# Patient Record
Sex: Female | Born: 1985 | Race: White | Hispanic: Yes | Smoking: Current every day smoker
Health system: Southern US, Community
[De-identification: ages and names within clinical notes are randomized; demographics above are authoritative.]

## PROBLEM LIST (undated history)

## (undated) ENCOUNTER — Emergency Department (HOSPITAL_COMMUNITY): Admission: EM | Discharge status: Absent without leave | Payer: Medicare Other

## (undated) DIAGNOSIS — F32A Depression, unspecified: Secondary | ICD-10-CM

## (undated) DIAGNOSIS — R569 Unspecified convulsions: Secondary | ICD-10-CM

## (undated) DIAGNOSIS — M549 Dorsalgia, unspecified: Secondary | ICD-10-CM

## (undated) DIAGNOSIS — N200 Calculus of kidney: Secondary | ICD-10-CM

## (undated) DIAGNOSIS — F419 Anxiety disorder, unspecified: Secondary | ICD-10-CM

## (undated) DIAGNOSIS — F329 Major depressive disorder, single episode, unspecified: Secondary | ICD-10-CM

## (undated) HISTORY — DX: Major depressive disorder, single episode, unspecified: F32.9

## (undated) HISTORY — DX: Depression, unspecified: F32.A

## (undated) HISTORY — PX: IMPLANTATION VAGAL NERVE STIMULATOR: SUR692

## (undated) HISTORY — PX: VSD REPAIR: SHX276

## (undated) HISTORY — DX: Anxiety disorder, unspecified: F41.9

---

## 2015-10-04 DIAGNOSIS — G40909 Epilepsy, unspecified, not intractable, without status epilepticus: Secondary | ICD-10-CM | POA: Diagnosis not present

## 2015-10-04 DIAGNOSIS — F329 Major depressive disorder, single episode, unspecified: Secondary | ICD-10-CM | POA: Diagnosis not present

## 2015-10-04 DIAGNOSIS — F419 Anxiety disorder, unspecified: Secondary | ICD-10-CM | POA: Diagnosis not present

## 2015-10-04 DIAGNOSIS — Z0000 Encounter for general adult medical examination without abnormal findings: Secondary | ICD-10-CM | POA: Diagnosis not present

## 2015-11-21 DIAGNOSIS — F329 Major depressive disorder, single episode, unspecified: Secondary | ICD-10-CM | POA: Diagnosis not present

## 2015-11-21 DIAGNOSIS — R634 Abnormal weight loss: Secondary | ICD-10-CM | POA: Diagnosis not present

## 2015-11-21 DIAGNOSIS — F419 Anxiety disorder, unspecified: Secondary | ICD-10-CM | POA: Diagnosis not present

## 2015-12-18 DIAGNOSIS — G40219 Localization-related (focal) (partial) symptomatic epilepsy and epileptic syndromes with complex partial seizures, intractable, without status epilepticus: Secondary | ICD-10-CM | POA: Diagnosis not present

## 2015-12-18 DIAGNOSIS — G4700 Insomnia, unspecified: Secondary | ICD-10-CM | POA: Diagnosis not present

## 2015-12-18 DIAGNOSIS — Z9689 Presence of other specified functional implants: Secondary | ICD-10-CM | POA: Diagnosis not present

## 2016-04-03 DIAGNOSIS — F419 Anxiety disorder, unspecified: Secondary | ICD-10-CM | POA: Diagnosis not present

## 2016-04-03 DIAGNOSIS — G40909 Epilepsy, unspecified, not intractable, without status epilepticus: Secondary | ICD-10-CM | POA: Diagnosis not present

## 2016-04-03 DIAGNOSIS — H6983 Other specified disorders of Eustachian tube, bilateral: Secondary | ICD-10-CM | POA: Diagnosis not present

## 2016-05-07 DIAGNOSIS — J0110 Acute frontal sinusitis, unspecified: Secondary | ICD-10-CM | POA: Diagnosis not present

## 2016-05-07 DIAGNOSIS — Z8639 Personal history of other endocrine, nutritional and metabolic disease: Secondary | ICD-10-CM | POA: Diagnosis not present

## 2016-05-07 DIAGNOSIS — F419 Anxiety disorder, unspecified: Secondary | ICD-10-CM | POA: Diagnosis not present

## 2016-05-07 DIAGNOSIS — G4700 Insomnia, unspecified: Secondary | ICD-10-CM | POA: Diagnosis not present

## 2016-06-24 DIAGNOSIS — G40909 Epilepsy, unspecified, not intractable, without status epilepticus: Secondary | ICD-10-CM | POA: Diagnosis not present

## 2016-06-24 DIAGNOSIS — G4700 Insomnia, unspecified: Secondary | ICD-10-CM | POA: Diagnosis not present

## 2016-06-24 DIAGNOSIS — Z3009 Encounter for other general counseling and advice on contraception: Secondary | ICD-10-CM | POA: Diagnosis not present

## 2016-06-24 DIAGNOSIS — F418 Other specified anxiety disorders: Secondary | ICD-10-CM | POA: Diagnosis not present

## 2016-07-13 DIAGNOSIS — R1084 Generalized abdominal pain: Secondary | ICD-10-CM | POA: Diagnosis not present

## 2016-07-13 DIAGNOSIS — F17200 Nicotine dependence, unspecified, uncomplicated: Secondary | ICD-10-CM | POA: Diagnosis not present

## 2016-07-13 DIAGNOSIS — M545 Low back pain: Secondary | ICD-10-CM | POA: Diagnosis not present

## 2016-07-13 DIAGNOSIS — M5489 Other dorsalgia: Secondary | ICD-10-CM | POA: Diagnosis not present

## 2016-08-07 DIAGNOSIS — Z716 Tobacco abuse counseling: Secondary | ICD-10-CM | POA: Diagnosis not present

## 2016-08-07 DIAGNOSIS — Z01419 Encounter for gynecological examination (general) (routine) without abnormal findings: Secondary | ICD-10-CM | POA: Diagnosis not present

## 2016-08-07 DIAGNOSIS — Z124 Encounter for screening for malignant neoplasm of cervix: Secondary | ICD-10-CM | POA: Diagnosis not present

## 2016-08-07 DIAGNOSIS — Z30432 Encounter for removal of intrauterine contraceptive device: Secondary | ICD-10-CM | POA: Diagnosis not present

## 2016-09-17 DIAGNOSIS — G40219 Localization-related (focal) (partial) symptomatic epilepsy and epileptic syndromes with complex partial seizures, intractable, without status epilepticus: Secondary | ICD-10-CM | POA: Diagnosis not present

## 2016-09-17 DIAGNOSIS — Z4542 Encounter for adjustment and management of neuropacemaker (brain) (peripheral nerve) (spinal cord): Secondary | ICD-10-CM | POA: Diagnosis not present

## 2016-09-17 DIAGNOSIS — G4700 Insomnia, unspecified: Secondary | ICD-10-CM | POA: Diagnosis not present

## 2016-10-27 DIAGNOSIS — O3680X1 Pregnancy with inconclusive fetal viability, fetus 1: Secondary | ICD-10-CM | POA: Diagnosis not present

## 2016-10-27 DIAGNOSIS — Z1389 Encounter for screening for other disorder: Secondary | ICD-10-CM | POA: Diagnosis not present

## 2016-10-27 DIAGNOSIS — O99330 Smoking (tobacco) complicating pregnancy, unspecified trimester: Secondary | ICD-10-CM | POA: Diagnosis not present

## 2016-10-27 DIAGNOSIS — O09891 Supervision of other high risk pregnancies, first trimester: Secondary | ICD-10-CM | POA: Diagnosis not present

## 2016-10-27 DIAGNOSIS — G40901 Epilepsy, unspecified, not intractable, with status epilepticus: Secondary | ICD-10-CM | POA: Diagnosis not present

## 2016-10-27 DIAGNOSIS — O99350 Diseases of the nervous system complicating pregnancy, unspecified trimester: Secondary | ICD-10-CM | POA: Diagnosis not present

## 2016-10-27 DIAGNOSIS — Z3A01 Less than 8 weeks gestation of pregnancy: Secondary | ICD-10-CM | POA: Diagnosis not present

## 2016-10-27 DIAGNOSIS — R350 Frequency of micturition: Secondary | ICD-10-CM | POA: Diagnosis not present

## 2016-10-27 DIAGNOSIS — Z3401 Encounter for supervision of normal first pregnancy, first trimester: Secondary | ICD-10-CM | POA: Diagnosis not present

## 2016-10-27 DIAGNOSIS — Z3201 Encounter for pregnancy test, result positive: Secondary | ICD-10-CM | POA: Diagnosis not present

## 2016-12-02 DIAGNOSIS — S0081XA Abrasion of other part of head, initial encounter: Secondary | ICD-10-CM | POA: Diagnosis not present

## 2016-12-02 DIAGNOSIS — S0990XA Unspecified injury of head, initial encounter: Secondary | ICD-10-CM | POA: Diagnosis not present

## 2016-12-02 DIAGNOSIS — R569 Unspecified convulsions: Secondary | ICD-10-CM | POA: Diagnosis not present

## 2016-12-02 DIAGNOSIS — R892 Abnormal level of other drugs, medicaments and biological substances in specimens from other organs, systems and tissues: Secondary | ICD-10-CM | POA: Diagnosis not present

## 2016-12-02 DIAGNOSIS — S161XXA Strain of muscle, fascia and tendon at neck level, initial encounter: Secondary | ICD-10-CM | POA: Diagnosis not present

## 2016-12-02 DIAGNOSIS — S0083XA Contusion of other part of head, initial encounter: Secondary | ICD-10-CM | POA: Diagnosis not present

## 2016-12-04 DIAGNOSIS — G4700 Insomnia, unspecified: Secondary | ICD-10-CM | POA: Diagnosis not present

## 2016-12-04 DIAGNOSIS — G40219 Localization-related (focal) (partial) symptomatic epilepsy and epileptic syndromes with complex partial seizures, intractable, without status epilepticus: Secondary | ICD-10-CM | POA: Diagnosis not present

## 2016-12-04 DIAGNOSIS — Z4542 Encounter for adjustment and management of neuropacemaker (brain) (peripheral nerve) (spinal cord): Secondary | ICD-10-CM | POA: Diagnosis not present

## 2016-12-22 DIAGNOSIS — O039 Complete or unspecified spontaneous abortion without complication: Secondary | ICD-10-CM | POA: Diagnosis not present

## 2016-12-22 DIAGNOSIS — Z30430 Encounter for insertion of intrauterine contraceptive device: Secondary | ICD-10-CM | POA: Diagnosis not present

## 2016-12-22 DIAGNOSIS — N921 Excessive and frequent menstruation with irregular cycle: Secondary | ICD-10-CM | POA: Diagnosis not present

## 2017-01-12 DIAGNOSIS — Z969 Presence of functional implant, unspecified: Secondary | ICD-10-CM | POA: Diagnosis not present

## 2017-01-12 DIAGNOSIS — G4700 Insomnia, unspecified: Secondary | ICD-10-CM | POA: Diagnosis not present

## 2017-01-12 DIAGNOSIS — G40219 Localization-related (focal) (partial) symptomatic epilepsy and epileptic syndromes with complex partial seizures, intractable, without status epilepticus: Secondary | ICD-10-CM | POA: Diagnosis not present

## 2017-01-15 DIAGNOSIS — Z30431 Encounter for routine checking of intrauterine contraceptive device: Secondary | ICD-10-CM | POA: Diagnosis not present

## 2017-03-13 ENCOUNTER — Emergency Department (HOSPITAL_COMMUNITY)
Admission: EM | Admit: 2017-03-13 | Discharge: 2017-03-13 | Disposition: A | Discharge status: Home / Self Care | Payer: Medicare Other | Attending: Emergency Medicine | Admitting: Emergency Medicine

## 2017-03-13 ENCOUNTER — Encounter (HOSPITAL_COMMUNITY): Payer: Self-pay

## 2017-03-13 DIAGNOSIS — Y92019 Unspecified place in single-family (private) house as the place of occurrence of the external cause: Secondary | ICD-10-CM | POA: Diagnosis not present

## 2017-03-13 DIAGNOSIS — S39012A Strain of muscle, fascia and tendon of lower back, initial encounter: Secondary | ICD-10-CM | POA: Insufficient documentation

## 2017-03-13 DIAGNOSIS — Z975 Presence of (intrauterine) contraceptive device: Secondary | ICD-10-CM | POA: Diagnosis not present

## 2017-03-13 DIAGNOSIS — Y9389 Activity, other specified: Secondary | ICD-10-CM | POA: Diagnosis not present

## 2017-03-13 DIAGNOSIS — X500XXA Overexertion from strenuous movement or load, initial encounter: Secondary | ICD-10-CM | POA: Diagnosis not present

## 2017-03-13 DIAGNOSIS — Z79899 Other long term (current) drug therapy: Secondary | ICD-10-CM | POA: Diagnosis not present

## 2017-03-13 DIAGNOSIS — Y998 Other external cause status: Secondary | ICD-10-CM | POA: Diagnosis not present

## 2017-03-13 DIAGNOSIS — F17210 Nicotine dependence, cigarettes, uncomplicated: Secondary | ICD-10-CM | POA: Insufficient documentation

## 2017-03-13 DIAGNOSIS — S3992XA Unspecified injury of lower back, initial encounter: Secondary | ICD-10-CM | POA: Diagnosis present

## 2017-03-13 HISTORY — DX: Unspecified convulsions: R56.9

## 2017-03-13 HISTORY — DX: Dorsalgia, unspecified: M54.9

## 2017-03-13 MED ORDER — METHOCARBAMOL 500 MG PO TABS: 500.0000 mg | ORAL_TABLET | Freq: Two times a day (BID) | ORAL | 0 refills | Status: DC | PRN

## 2017-03-13 MED ORDER — KETOROLAC TROMETHAMINE 60 MG/2ML IM SOLN
60.0000 mg | Freq: Once | INTRAMUSCULAR | Status: AC
  Administered 2017-03-13: 60 mg via INTRAMUSCULAR
  Filled 2017-03-13: qty 2

## 2017-03-13 MED ORDER — DEXAMETHASONE SODIUM PHOSPHATE 10 MG/ML IJ SOLN
10.0000 mg | Freq: Once | INTRAMUSCULAR | Status: AC
  Administered 2017-03-13: 10 mg via INTRAMUSCULAR
  Filled 2017-03-13: qty 1

## 2017-03-13 MED ORDER — IBUPROFEN 800 MG PO TABS: 800.0000 mg | ORAL_TABLET | Freq: Three times a day (TID) | ORAL | 0 refills | Status: DC

## 2017-03-13 NOTE — ED Provider Notes (Signed)
[facility] DEPT Provider Note   CSN: [id number] Arrival date & time: 03/13/17  2153     History   Chief Complaint Chief Complaint  Patient presents with  . Back Pain    HPI Loretta Padilla is a 31 y.o. female.  HPI  The patient is a 31 year old female, she reports a chronic history of seizures and back pain, has recently moved here from the state of [place] and states that while they've been moving for the last 3 days she has done lots of lifting, bending and twisting and is developed some lower back in the bilateral lower to mid back radiating around the left side towards the left mid axillary line. She has no numbness or weakness of the legs, no fevers or chills, no history of cancer or IV drug use and has never had formal thoracic or lumbar surgery. She reports that she has worsening pain with bending and rotation, and seems to be better when she lays perfectly still but seems to hurt constantly. She has not used any specific medications prior to arrival. She has been taking her antiepileptic medications. She is not complaining of seizures this evening.  Past Medical History:  Diagnosis Date  . Back pain   . Seizures (HCC)     There are no active problems to display for this patient.   Past Surgical History:  Procedure Laterality Date  . VSD REPAIR      OB History    No data available       Home Medications    Prior to Admission medications   Medication Sig Start Date End Date Taking? Authorizing Provider  IUD'S IU by Intrauterine route.   Yes [provider]  topiramate (TOPAMAX) 100 MG tablet Take 200 mg by mouth 2 (two) times daily.   Yes [provider]  zolpidem (AMBIEN) 10 MG tablet Take 10 mg by mouth at bedtime as needed for sleep.   Yes [provider]  ibuprofen (ADVIL,MOTRIN) 800 MG tablet Take 1 tablet (800 mg total) by mouth 3 (three) times daily. 03/13/17   Eber Hong, MD  methocarbamol (ROBAXIN) 500 MG tablet Take 1  tablet (500 mg total) by mouth 2 (two) times daily as needed for muscle spasms. 03/13/17   Eber Hong, MD    Family History No family history on file.  Social History Social History  Substance Use Topics  . Smoking status: Current Every Day Smoker    Packs/day: 1.00  . Smokeless tobacco: Never Used  . Alcohol use Yes     Comment: occ.     Allergies   Carbatrol [carbamazepine]; Dilantin [phenytoin sodium extended]; Tizanidine hcl; and Venlafaxine   Review of Systems Review of Systems  Constitutional: Negative for chills and fever.  Cardiovascular: Negative for leg swelling.  Gastrointestinal: Negative for nausea and vomiting.       No incontinence of bowel  Genitourinary: Negative for difficulty urinating.       No incontinence or retention  Musculoskeletal: Positive for back pain. Negative for neck pain.  Skin: Negative for rash.  Neurological: Negative for weakness and numbness.     Physical Exam Updated Vital Signs BP 115/68 (BP Location: Right Arm)   Pulse 73   Temp 98.1 F (36.7 C) (Oral)   Resp 18   Ht 5\' 4"  (1.626 m)   Wt 52.2 kg (115 lb)   LMP 02/19/2017   SpO2 100%   BMI 19.74 kg/m   Physical Exam  Constitutional: She appears well-developed  and well-nourished. No distress.  HENT:  Head: Normocephalic and atraumatic.  Eyes: Conjunctivae are normal. Right eye exhibits no discharge. Left eye exhibits no discharge. No scleral icterus.  Cardiovascular: Normal rate and regular rhythm.   Pulmonary/Chest: Effort normal and breath sounds normal.  Musculoskeletal: She exhibits no edema.  Tenderness of the back over the bilateral paraspinal muscles in the lumbar and thoracic region more on the left than the right, no midline tenderness No tenderness over the Cervical, Thoracic or Lumbar Spine  Neurological:  Speech is clear, strength in the UE and LE's are normal at the major muscle groups including the hip, knee and ankles.  Sensation in tact to light touch  and pin prick of the bilateral LE's.  Normal reflexes at the knees bilaterally.  Gait antalgic secondary to pain minimally  Skin: Skin is warm and dry. No rash noted. She is not diaphoretic.     ED Treatments / Results  Labs (all labs ordered are listed, but only abnormal results are displayed) Labs Reviewed - No data to display   Radiology No results found.  Procedures Procedures (including critical care time)  Medications Ordered in ED Medications  dexamethasone (DECADRON) injection 10 mg (not administered)  ketorolac (TORADOL) injection 60 mg (not administered)     Initial Impression / Assessment and Plan / ED Course  I have reviewed the triage vital signs and the nursing notes.  Pertinent labs & imaging results that were available during my care of the patient were reviewed by me and considered in my medical decision making (see chart for details).     Benign back pain, likely lumbar strain, no signs of neurologic or red flags. Stable for discharge after given medications as above. Patient expressed understanding for the indications for return, follow-up list given for local family doctors. The patient was satisfied with her treatment plan and the indications for follow-up and expressed her understanding.  Final Clinical Impressions(s) / ED Diagnoses   Final diagnoses:  Strain of lumbar region, initial encounter    New Prescriptions New Prescriptions   IBUPROFEN (ADVIL,MOTRIN) 800 MG TABLET    Take 1 tablet (800 mg total) by mouth 3 (three) times daily.   METHOCARBAMOL (ROBAXIN) 500 MG TABLET    Take 1 tablet (500 mg total) by mouth 2 (two) times daily as needed for muscle spasms.     Eber HongMiller, Idell Hissong, MD 03/13/17 [phone]

## 2017-03-13 NOTE — Discharge Instructions (Signed)
Please obtain all of your results from medical records or have your doctors office obtain the results - share them with your doctor - you should be seen at your doctors office in the next 2 days. Call today to arrange your follow up. Take the medications as prescribed. Please review all of the medicines and only take them if you do not have an allergy to them. Please be aware that if you are taking birth control pills, taking other prescriptions, ESPECIALLY ANTIBIOTICS may make the birth control ineffective - if this is the case, either do not engage in sexual activity or use alternative methods of birth control such as condoms until you have finished the medicine and your family doctor says it is OK to restart them. If you are on a blood thinner such as COUMADIN, be aware that any other medicine that you take may cause the coumadin to either work too much, or not enough - you should have your coumadin level rechecked in next 7 days if this is the case.  ?  It is also a possibility that you have an allergic reaction to any of the medicines that you have been prescribed - Everybody reacts differently to medications and while MOST people have no trouble with most medicines, you may have a reaction such as nausea, vomiting, rash, swelling, shortness of breath. If this is the case, please stop taking the medicine immediately and contact your physician.  ?  You should return to the ER if you develop severe or worsening symptoms.   Robaxin twice daily as needed for muscle spasm Motrin 3 times daily as needed for pain Heat packs intermittently (wrapped in a towel)  [facility] Doctor List    Kari Baars MD. Specialty: Pulmonary Disease Contact information: [address]  [address]  [place] [address]  [phone]   Syliva Overman, MD. Specialty: [facility] Medicine Contact information: [address]  [place] [address]  [phone]   Lilyan Punt, MD. Specialty:  Family Medicine Contact information: [address]  [place] [address]  [phone]   Avon Gully, MD Specialty: Internal Medicine Contact information: [address] [place] [address]  [phone]   Catalina Pizza, MD. Specialty: Internal Medicine Contact information: [address]  [place] [address]  [phone]    [facility] (Dr. Selena Batten) Specialty: Family Medicine Contact information: [address]  [place] [address]  [phone]   John Giovanni, MD. Specialty: [facility] Medicine Contact information: [address]  [address]  [place] [address]  [phone]   Carylon Perches, MD. Specialty: Internal Medicine Contact information: [address]  [address]  [place] [address]  [phone]    [facility]  [address] [place], [place] [address] [phone]  Services [facility] offers a variety of basic health services.  Services include but are not limited to: Blood pressure checks  Heart rate checks  Blood sugar checks  Urine analysis  Rapid strep tests  Pregnancy tests.  Health education and referrals  People needing more complex services will be directed to a physician online. Using these virtual visits, doctors can evaluate and prescribe medicine and treatments. There will be no medication on-site, though [facility] will help patients fill their prescriptions at little to no cost.   For More information please go to: [email or web address]  Back Pain:   Your back pain should be treated with medicines such as ibuprofen or aleve and this back pain should get better over the next 2 weeks.  However if you develop severe or worsening pain, low back pain with fever, numbness, weakness or inability to walk or urinate, you should return to the ER immediately.  Please follow up with your  doctor this week for a recheck if still having symptoms. Low back pain is discomfort in the lower back that may be due to injuries to muscles and ligaments around the spine.  Occasionally, it may be caused by a a problem to a part of the spine called a disc.  The pain may last several days or a week;  However, most patients get completely well in 4 weeks.  Self - care:  The application of heat can help soothe the pain.  Maintaining your daily activities, including walking, is encourged, as it will help you get better faster than just staying in bed.  Medications are also useful to help with pain control.  A commonly prescribed medications includes acetaminophen.  This medication is generally safe, though you should not take more than 8 of the extra strength (500mg ) pills a day.  Non steroidal anti inflammatory medications including Ibuprofen and naproxen;  These medications help both pain and swelling and are very useful in treating back pain.  They should be taken with food, as they can cause stomach upset, and more seriously, stomach bleeding.    Muscle relaxants:  These medications can help with muscle tightness that is a cause of lower back pain.  Most of these medications can cause drowsiness, and it is not safe to drive or use dangerous machinery while taking them.  You will need to follow up with  Your primary healthcare provider in 1-2 weeks for reassessment.  Be aware that if you develop new symptoms, such as a fever, leg weakness, difficulty with or loss of control of your urine or bowels, abdominal pain, or more severe pain, you will need to seek medical attention and  / or return to the Emergency department.

## 2017-03-13 NOTE — ED Triage Notes (Signed)
Reports of lower back pain x3 days. 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

## 2017-04-04 ENCOUNTER — Encounter (HOSPITAL_COMMUNITY): Payer: Self-pay | Admitting: *Deleted

## 2017-04-04 DIAGNOSIS — R1032 Left lower quadrant pain: Secondary | ICD-10-CM | POA: Insufficient documentation

## 2017-04-04 DIAGNOSIS — F17210 Nicotine dependence, cigarettes, uncomplicated: Secondary | ICD-10-CM | POA: Diagnosis not present

## 2017-04-04 LAB — URINALYSIS, ROUTINE W REFLEX MICROSCOPIC
Glucose, UA: NEGATIVE mg/dL
Hgb urine dipstick: NEGATIVE
Ketones, ur: NEGATIVE mg/dL
Leukocytes, UA: NEGATIVE
Nitrite: NEGATIVE
Protein, ur: NEGATIVE mg/dL
Specific Gravity, Urine: 1.03 (ref 1.005–1.030)
pH: 5 (ref 5.0–8.0)

## 2017-04-04 LAB — COMPREHENSIVE METABOLIC PANEL
ALT: 11 U/L — ABNORMAL LOW (ref 14–54)
AST: 13 U/L — ABNORMAL LOW (ref 15–41)
Albumin: 4.2 g/dL (ref 3.5–5.0)
Alkaline Phosphatase: 46 U/L (ref 38–126)
Anion gap: 9 (ref 5–15)
BUN: 33 mg/dL — ABNORMAL HIGH (ref 6–20)
CO2: 20 mmol/L — ABNORMAL LOW (ref 22–32)
Calcium: 9.2 mg/dL (ref 8.9–10.3)
Chloride: 110 mmol/L (ref 101–111)
Creatinine, Ser: 0.97 mg/dL (ref 0.44–1.00)
GFR calc Af Amer: >60 mL/min (ref >60)
GFR calc non Af Amer: >60 mL/min (ref >60)
Glucose, Bld: 100 mg/dL — ABNORMAL HIGH (ref 65–99)
Potassium: 3.9 mmol/L (ref 3.5–5.1)
Sodium: 139 mmol/L (ref 135–145)
Total Bilirubin: 0.9 mg/dL (ref 0.3–1.2)
Total Protein: 6.6 g/dL (ref 6.5–8.1)

## 2017-04-04 LAB — CBC
HCT: 38.5 % (ref 36.0–46.0)
Hemoglobin: 13.2 g/dL (ref 12.0–15.0)
MCH: 30.8 pg (ref 26.0–34.0)
MCHC: 34.3 g/dL (ref 30.0–36.0)
MCV: 90 fL (ref 78.0–100.0)
Platelets: 224 10*3/uL (ref 150–400)
RBC: 4.28 MIL/uL (ref 3.87–5.11)
RDW: 12.2 % (ref 11.5–15.5)
WBC: 6.6 10*3/uL (ref 4.0–10.5)

## 2017-04-04 LAB — PREGNANCY, URINE: Preg Test, Ur: NEGATIVE

## 2017-04-04 LAB — LIPASE, BLOOD: Lipase: 31 U/L (ref 11–51)

## 2017-04-04 NOTE — ED Notes (Signed)
Pt in waiting room, NAD noted.

## 2017-04-04 NOTE — ED Triage Notes (Signed)
Pt c/o left lower abd, back pain with nausea that started today, weak urine stream, 6 days late for her period, and reflux for the past few days,

## 2017-04-05 ENCOUNTER — Emergency Department (HOSPITAL_COMMUNITY)
Admission: EM | Admit: 2017-04-05 | Discharge: 2017-04-05 | Disposition: A | Discharge status: Home / Self Care | Payer: Medicare Other | Attending: Emergency Medicine | Admitting: Emergency Medicine

## 2017-04-05 ENCOUNTER — Emergency Department (HOSPITAL_COMMUNITY): Payer: Medicare Other

## 2017-04-05 DIAGNOSIS — R1032 Left lower quadrant pain: Secondary | ICD-10-CM

## 2017-04-05 HISTORY — DX: Calculus of kidney: N20.0

## 2017-04-05 LAB — WET PREP, GENITAL
Trich, Wet Prep: NONE SEEN
Yeast Wet Prep HPF POC: NONE SEEN

## 2017-04-05 MED ORDER — SODIUM CHLORIDE 0.9 % IV BOLUS (SEPSIS)
1000.0000 mL | Freq: Once | INTRAVENOUS | Status: AC
  Administered 2017-04-05: 1000 mL via INTRAVENOUS

## 2017-04-05 MED ORDER — HYDROCODONE-ACETAMINOPHEN 5-325 MG PO TABS
1.0000 | ORAL_TABLET | Freq: Four times a day (QID) | ORAL | 0 refills | Status: DC | PRN
Start: 2017-04-05 — End: 2017-11-04

## 2017-04-05 MED ORDER — ONDANSETRON HCL 4 MG/2ML IJ SOLN
4.0000 mg | Freq: Once | INTRAMUSCULAR | Status: AC
  Administered 2017-04-05: 4 mg via INTRAVENOUS
  Filled 2017-04-05: qty 2

## 2017-04-05 MED ORDER — KETOROLAC TROMETHAMINE 30 MG/ML IJ SOLN
30.0000 mg | Freq: Once | INTRAMUSCULAR | Status: AC
  Administered 2017-04-05: 30 mg via INTRAVENOUS
  Filled 2017-04-05: qty 1

## 2017-04-05 MED ORDER — MORPHINE SULFATE (PF) 4 MG/ML IV SOLN
4.0000 mg | Freq: Once | INTRAVENOUS | Status: AC
  Administered 2017-04-05: 4 mg via INTRAVENOUS
  Filled 2017-04-05: qty 1

## 2017-04-05 NOTE — ED Provider Notes (Signed)
[facility] DEPT Provider Note   CSN: [id number] Arrival date & time: 04/04/17  2109     History   Chief Complaint Chief Complaint  Patient presents with  . Abdominal Pain    HPI Loretta Padilla is a 31 y.o. female.  HPI  This is a 31 year old female with a history of kidney stones, seizures who presents with left lower quadrant pain. Patient presents with pain that started earlier today. She reports intermittent left lower quadrant and left flank pain. It comes and goes. Reports nausea without vomiting. Currently her pain is 10 out of 10. Not improved at home with over-the-counter pain medications. She reports weak urinary stream. No dysuria or hematuria. She has a new IUD and is 6 days late for her period.  Also reports increased reflux symptoms. Denies vomiting, diarrhea, chest pain, shortness breath, fevers. She is in sure whether this feels like her prior kidney stones.  Past Medical History:  Diagnosis Date  . Back pain   . Kidney stones   . Seizures (HCC)     There are no active problems to display for this patient.   Past Surgical History:  Procedure Laterality Date  . VSD REPAIR      OB History    No data available       Home Medications    Prior to Admission medications   Medication Sig Start Date End Date Taking? Authorizing Provider  HYDROcodone-acetaminophen (NORCO/VICODIN) 5-325 MG tablet Take 1 tablet by mouth every 6 (six) hours as needed. 04/05/17   Geoffry Bannister, Mayer Maskerourtney F, MD  ibuprofen (ADVIL,MOTRIN) 800 MG tablet Take 1 tablet (800 mg total) by mouth 3 (three) times daily. 03/13/17   Eber HongMiller, Brian, MD  IUD'S IU by Intrauterine route.    [provider]  methocarbamol (ROBAXIN) 500 MG tablet Take 1 tablet (500 mg total) by mouth 2 (two) times daily as needed for muscle spasms. 03/13/17   Eber HongMiller, Brian, MD  topiramate (TOPAMAX) 100 MG tablet Take 200 mg by mouth 2 (two) times daily.    [provider]  zolpidem (AMBIEN) 10 MG tablet  Take 10 mg by mouth at bedtime as needed for sleep.    [provider]    Family History No family history on file.  Social History Social History  Substance Use Topics  . Smoking status: Current Every Day Smoker    Packs/day: 1.00  . Smokeless tobacco: Never Used  . Alcohol use Yes     Comment: occ.     Allergies   Carbatrol [carbamazepine]; Dilantin [phenytoin sodium extended]; Tizanidine hcl; and Venlafaxine   Review of Systems Review of Systems  Constitutional: Negative for fever.  Respiratory: Negative for shortness of breath.   Cardiovascular: Negative for chest pain.  Gastrointestinal: Positive for abdominal pain and nausea. Negative for constipation, diarrhea and vomiting.  Genitourinary: Positive for flank pain and urgency. Negative for difficulty urinating, vaginal discharge and vaginal pain.  All other systems reviewed and are negative.    Physical Exam Updated Vital Signs BP 108/74 (BP Location: Right Arm)   Pulse 64   Temp 98.3 F (36.8 C) (Oral)   Resp 16   Ht 5\' 5"  (1.651 m)   SpO2 100%   Physical Exam  Constitutional: She is oriented to person, place, and time. She appears well-developed and well-nourished. No distress.  HENT:  Head: Normocephalic and atraumatic.  Cardiovascular: Normal rate, regular rhythm and normal heart sounds.   Pulmonary/Chest: Effort normal. No respiratory distress. She has no  wheezes.  Abdominal: Soft. Bowel sounds are normal. There is tenderness. There is no rebound and no guarding.  Left lower quadrant tenderness palpation, no rebound or guarding  Genitourinary:  Genitourinary Comments: External vaginal exam normal, moderate white vaginal discharge, IUD strings visualized, no cervical motion tenderness, left adnexal tenderness without mass  Neurological: She is alert and oriented to person, place, and time.  Skin: Skin is warm and dry.  Psychiatric: She has a normal mood and affect.  Nursing note and vitals  reviewed.    ED Treatments / Results  Labs (all labs ordered are listed, but only abnormal results are displayed) Labs Reviewed  WET PREP, GENITAL - Abnormal; Notable for the following:       Result Value   Clue Cells Wet Prep HPF POC PRESENT (*)    WBC, Wet Prep HPF POC MODERATE (*)    All other components within normal limits  COMPREHENSIVE METABOLIC PANEL - Abnormal; Notable for the following:    CO2 20 (*)    Glucose, Bld 100 (*)    BUN 33 (*)    AST 13 (*)    ALT 11 (*)    All other components within normal limits  URINALYSIS, ROUTINE W REFLEX MICROSCOPIC - Abnormal; Notable for the following:    Bilirubin Urine MODERATE (*)    All other components within normal limits  LIPASE, BLOOD  CBC  PREGNANCY, URINE  GC/CHLAMYDIA PROBE AMP ([place]) NOT AT [facility]    EKG  EKG Interpretation None       Radiology Ct Renal Stone Study  Result Date: 04/05/2017 CLINICAL DATA:  Left lower quadrant abdominal pain, back pain, and nausea starting today. Weak urine stream. Six days late for period. EXAM: CT ABDOMEN AND PELVIS WITHOUT CONTRAST TECHNIQUE: Multidetector CT imaging of the abdomen and pelvis was performed following the standard protocol without IV contrast. COMPARISON:  None. FINDINGS: Lower chest: Lung bases are clear. Hepatobiliary: No focal liver abnormality is seen. No gallstones, gallbladder wall thickening, or biliary dilatation. Pancreas: Unremarkable. No pancreatic ductal dilatation or surrounding inflammatory changes. Spleen: Normal in size without focal abnormality. Adrenals/Urinary Tract: Adrenal glands are unremarkable. Kidneys are normal, without renal calculi, focal lesion, or hydronephrosis. Bladder is unremarkable. Stomach/Bowel: Stomach is within normal limits. Appendix appears normal. No evidence of bowel wall thickening, distention, or inflammatory changes. Vascular/Lymphatic: No significant vascular findings are present. No enlarged abdominal or pelvic lymph  nodes. Reproductive: An intrauterine device is present. Uterus and ovaries are not enlarged. Other: No abdominal wall hernia or abnormality. No abdominopelvic ascites. Musculoskeletal: No acute or significant osseous findings. IMPRESSION: No acute process demonstrated in the abdomen or pelvis. No renal or ureteral stone or obstruction. An intrauterine device is present. Electronically Signed   By: Burman NievesWilliam  Stevens M.D.   On: 04/05/2017 01:50    Procedures Procedures (including critical care time)  Medications Ordered in ED Medications  sodium chloride 0.9 % bolus 1,000 mL (1,000 mLs Intravenous New Bag/Given 04/05/17 0121)  ketorolac (TORADOL) 30 MG/ML injection 30 mg (30 mg Intravenous Given 04/05/17 0122)  morphine 4 MG/ML injection 4 mg (4 mg Intravenous Given 04/05/17 0208)  ondansetron (ZOFRAN) injection 4 mg (4 mg Intravenous Given 04/05/17 0122)     Initial Impression / Assessment and Plan / ED Course  I have reviewed the triage vital signs and the nursing notes.  Pertinent labs & imaging results that were available during my care of the patient were reviewed by me and considered in my medical  decision making (see chart for details).     Patient presents with left lower quadrant pain 1 day. She's otherwise nontoxic-appearing. She does have some mild tenderness on exam. Lab work is largely reassuring. She states she is not concerned about STDs. Given radiation to the flank, could be a urinary tract infection or kidney stones. Also left ovarian pathology is consideration. Basic labwork is reassuring. Patient given pain and nausea medication. CT stone study obtained and is negative. Discussed with patient return for pelvic ultrasound. Will continue supportive measures as an outpatient.  After history, exam, and medical workup I feel the patient has been appropriately medically screened and is safe for discharge home. Pertinent diagnoses were discussed with the patient. Patient was given  return precautions.  Final Clinical Impressions(s) / ED Diagnoses   Final diagnoses:  LLQ pain    New Prescriptions New Prescriptions   HYDROCODONE-ACETAMINOPHEN (NORCO/VICODIN) 5-325 MG TABLET    Take 1 tablet by mouth every 6 (six) hours as needed.     Shon Baton, MD 04/05/17 [phone]

## 2017-04-05 NOTE — Discharge Instructions (Signed)
You were seen today for abdominal pain. Your workup is fairly reassuring. Follow-up later today for ultrasound to evaluate your ovaries. You'll be given a short course of pain medication.

## 2017-04-06 ENCOUNTER — Other Ambulatory Visit (HOSPITAL_COMMUNITY): Payer: Self-pay | Admitting: Emergency Medicine

## 2017-04-06 DIAGNOSIS — R1032 Left lower quadrant pain: Secondary | ICD-10-CM

## 2017-04-06 LAB — GC/CHLAMYDIA PROBE AMP (~~LOC~~) NOT AT ARMC
Chlamydia: NEGATIVE
Neisseria Gonorrhea: NEGATIVE

## 2017-04-09 ENCOUNTER — Ambulatory Visit (HOSPITAL_COMMUNITY)
Admission: RE | Admit: 2017-04-09 | Discharge: 2017-04-09 | Disposition: A | Discharge status: Home / Self Care | Payer: Medicare Other | Source: Ambulatory Visit | Attending: Emergency Medicine | Admitting: Emergency Medicine

## 2017-04-09 DIAGNOSIS — R1032 Left lower quadrant pain: Secondary | ICD-10-CM

## 2017-04-22 DIAGNOSIS — G40802 Other epilepsy, not intractable, without status epilepticus: Secondary | ICD-10-CM | POA: Diagnosis not present

## 2017-04-22 DIAGNOSIS — F339 Major depressive disorder, recurrent, unspecified: Secondary | ICD-10-CM | POA: Diagnosis not present

## 2017-05-04 ENCOUNTER — Telehealth (HOSPITAL_COMMUNITY): Payer: Self-pay | Admitting: *Deleted

## 2017-05-04 NOTE — Telephone Encounter (Signed)
left voice message regarding an appointment. 

## 2017-05-07 ENCOUNTER — Ambulatory Visit (INDEPENDENT_AMBULATORY_CARE_PROVIDER_SITE_OTHER): Payer: Medicare Other | Admitting: Psychiatry

## 2017-05-07 ENCOUNTER — Encounter (HOSPITAL_COMMUNITY): Payer: Self-pay | Admitting: Psychiatry

## 2017-05-07 ENCOUNTER — Encounter (INDEPENDENT_AMBULATORY_CARE_PROVIDER_SITE_OTHER): Payer: Self-pay

## 2017-05-07 DIAGNOSIS — Z91411 Personal history of adult psychological abuse: Secondary | ICD-10-CM | POA: Diagnosis not present

## 2017-05-07 DIAGNOSIS — F331 Major depressive disorder, recurrent, moderate: Secondary | ICD-10-CM | POA: Diagnosis not present

## 2017-05-07 DIAGNOSIS — Z818 Family history of other mental and behavioral disorders: Secondary | ICD-10-CM

## 2017-05-07 DIAGNOSIS — Z91410 Personal history of adult physical and sexual abuse: Secondary | ICD-10-CM

## 2017-05-07 DIAGNOSIS — Z87891 Personal history of nicotine dependence: Secondary | ICD-10-CM | POA: Diagnosis not present

## 2017-05-07 DIAGNOSIS — Z62810 Personal history of physical and sexual abuse in childhood: Secondary | ICD-10-CM | POA: Diagnosis not present

## 2017-05-07 DIAGNOSIS — F329 Major depressive disorder, single episode, unspecified: Secondary | ICD-10-CM | POA: Insufficient documentation

## 2017-05-07 DIAGNOSIS — Z811 Family history of alcohol abuse and dependence: Secondary | ICD-10-CM

## 2017-05-07 DIAGNOSIS — R569 Unspecified convulsions: Secondary | ICD-10-CM

## 2017-05-07 DIAGNOSIS — F32A Depression, unspecified: Secondary | ICD-10-CM | POA: Insufficient documentation

## 2017-05-07 DIAGNOSIS — M549 Dorsalgia, unspecified: Secondary | ICD-10-CM

## 2017-05-07 DIAGNOSIS — G4700 Insomnia, unspecified: Secondary | ICD-10-CM | POA: Diagnosis not present

## 2017-05-07 MED ORDER — FLUOXETINE HCL 20 MG PO TABS: 20.0000 mg | ORAL_TABLET | Freq: Three times a day (TID) | ORAL | 2 refills | Status: DC
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
## 2017-05-14 ENCOUNTER — Emergency Department (HOSPITAL_COMMUNITY): Payer: Medicare Other

## 2017-05-14 ENCOUNTER — Encounter (HOSPITAL_COMMUNITY): Payer: Self-pay | Admitting: Emergency Medicine

## 2017-05-14 ENCOUNTER — Telehealth (HOSPITAL_COMMUNITY): Payer: Self-pay | Admitting: *Deleted

## 2017-05-14 ENCOUNTER — Emergency Department (HOSPITAL_COMMUNITY)
Admission: EM | Admit: 2017-05-14 | Discharge: 2017-05-14 | Disposition: A | Discharge status: Home / Self Care | Payer: Medicare Other | Attending: Emergency Medicine | Admitting: Emergency Medicine

## 2017-05-14 DIAGNOSIS — G40909 Epilepsy, unspecified, not intractable, without status epilepticus: Secondary | ICD-10-CM

## 2017-05-14 DIAGNOSIS — G4489 Other headache syndrome: Secondary | ICD-10-CM | POA: Diagnosis not present

## 2017-05-14 DIAGNOSIS — R569 Unspecified convulsions: Secondary | ICD-10-CM | POA: Diagnosis not present

## 2017-05-14 DIAGNOSIS — Z79899 Other long term (current) drug therapy: Secondary | ICD-10-CM | POA: Insufficient documentation

## 2017-05-14 DIAGNOSIS — R51 Headache: Secondary | ICD-10-CM | POA: Insufficient documentation

## 2017-05-14 DIAGNOSIS — R110 Nausea: Secondary | ICD-10-CM | POA: Diagnosis not present

## 2017-05-14 DIAGNOSIS — Z87891 Personal history of nicotine dependence: Secondary | ICD-10-CM | POA: Diagnosis not present

## 2017-05-14 LAB — I-STAT CHEM 8, ED
BUN: 18 mg/dL (ref 6–20)
Calcium, Ion: 1.33 mmol/L (ref 1.15–1.40)
Chloride: 107 mmol/L (ref 101–111)
Creatinine, Ser: 0.8 mg/dL (ref 0.44–1.00)
Glucose, Bld: 92 mg/dL (ref 65–99)
HCT: 36 % (ref 36.0–46.0)
Hemoglobin: 12.2 g/dL (ref 12.0–15.0)
Potassium: 3.8 mmol/L (ref 3.5–5.1)
Sodium: 140 mmol/L (ref 135–145)
TCO2: 22 mmol/L (ref 22–32)

## 2017-05-14 LAB — URINALYSIS, ROUTINE W REFLEX MICROSCOPIC
Bilirubin Urine: NEGATIVE
Glucose, UA: NEGATIVE mg/dL
Hgb urine dipstick: NEGATIVE
Ketones, ur: NEGATIVE mg/dL
Leukocytes, UA: NEGATIVE
Nitrite: NEGATIVE
Protein, ur: NEGATIVE mg/dL
Specific Gravity, Urine: 1.005 (ref 1.005–1.030)
pH: 9 — ABNORMAL HIGH (ref 5.0–8.0)

## 2017-05-14 LAB — PREGNANCY, URINE: Preg Test, Ur: NEGATIVE

## 2017-05-14 MED ORDER — ACETAMINOPHEN 325 MG PO TABS
650.0000 mg | ORAL_TABLET | Freq: Once | ORAL | Status: AC
  Administered 2017-05-14: 650 mg via ORAL
  Filled 2017-05-14: qty 2

## 2017-05-14 MED ORDER — ONDANSETRON 4 MG PO TBDP: 4.0000 mg | ORAL_TABLET | Freq: Three times a day (TID) | ORAL | 0 refills | Status: DC | PRN

## 2017-05-14 NOTE — ED Provider Notes (Signed)
[facility]-EMER[facility]ENCY DEPT Provider Note   CSN: [id number] Arrival date & time: 05/14/17  1753     History   Chief Complaint Chief Complaint  Patient presents with  . Seizures    HPI Loretta Padilla is a 31 y.o. female.  HPI Pt was seen at 1800. Per pt, c/o gradual onset and resolution of several episodes of "seizures" that occurred PTA. Pt states she was painting when her symptoms began. Pt thinks her trigger was either the paint fumes or the color of the paint. Pt states she had "3 silent seizures:" these are seizures that she is aware of, states she remains awake and feels "a fluttering behind my eyes." Pt states she went and laid down and then felt her head turn to the left. Pt states "that usually means I'm going to have a seizure." Pt states she does not recall events until she woke up and was told she had a seizure. Pt states her seizures are associated with nausea and headache, of which she had both today. Pt states she had skipped the last 2 days of her seizure medication; but has taken her usual dose today. Denies incont of bowel/bladder, no fall, no focal motor weakness, no tingling/numbness in extremities, no CP/palpitations, no SOB/cough, no abd pain, no N/V/D. Pt has recently moved to the area and is due to establish care with a Neuro MD in [place].    Past Medical History:  Diagnosis Date  . Anxiety   . Back pain   . Depression   . Kidney stones   . Seizures [facility](HCC)     Patient Active Problem List   Diagnosis Date Noted  . Depression 05/07/2017  . Seizures (HCC) 05/07/2017    Past Surgical History:  Procedure Laterality Date  . IMPLANTATION VAGAL NERVE STIMULATOR    . VSD REPAIR      OB History    No data available       Home Medications    Prior to Admission medications   Medication Sig Start Date End Date Taking? Authorizing Provider  FLUoxetine (PROZAC) 20 MG tablet Take 1 tablet (20 mg total) by mouth 3 (three) times daily. 05/07/17   Myrlene Brokeross, Deborah R,  MD  HYDROcodone-acetaminophen (NORCO/VICODIN) 5-325 MG tablet Take 1 tablet by mouth every 6 (six) hours as needed. 04/05/17   Horton, Mayer Maskerourtney F, MD  ibuprofen (ADVIL,MOTRIN) 800 MG tablet Take 1 tablet (800 mg total) by mouth 3 (three) times daily. 03/13/17   Eber HongMiller, Brian, MD  IUD'S IU by Intrauterine route.    [provider]  methocarbamol (ROBAXIN) 500 MG tablet Take 1 tablet (500 mg total) by mouth 2 (two) times daily as needed for muscle spasms. 03/13/17   Eber HongMiller, Brian, MD  topiramate (TOPAMAX) 100 MG tablet Take 200 mg by mouth 2 (two) times daily.    [provider]  zolpidem (AMBIEN) 10 MG tablet Take 10 mg by mouth at bedtime as needed for sleep.    [provider]    Family History Family History  Problem Relation Age of Onset  . Bipolar disorder Mother   . Alcohol abuse Mother   . Depression Sister   . Anxiety disorder Sister   . Alcohol abuse Maternal Grandfather   . Alcohol abuse Paternal Grandfather     Social History Social History  Substance Use Topics  . Smoking status: Former Smoker    Packs/day: 1.00  . Smokeless tobacco: Never Used     Comment: 05-07-2017 per pt she stopped  3 wks from this date  . Alcohol use Yes     Comment: occ., 05-07-2017 per pt 2-3 times a mth     Allergies   Carbatrol [carbamazepine]; Dilantin [phenytoin sodium extended]; Tizanidine hcl; and Venlafaxine   Review of Systems Review of Systems ROS: Statement: All systems negative except as marked or noted in the HPI; Constitutional: Negative for fever and chills. ; ; Eyes: Negative for eye pain, redness and discharge. ; ; ENMT: Negative for ear pain, hoarseness, nasal congestion, sinus pressure and sore throat. ; ; Cardiovascular: Negative for chest pain, palpitations, diaphoresis, dyspnea and peripheral edema. ; ; Respiratory: Negative for cough, wheezing and stridor. ; ; Gastrointestinal: Negative for nausea, vomiting, diarrhea, abdominal pain, blood in stool,  hematemesis, jaundice and rectal bleeding. . ; ; Genitourinary: Negative for dysuria, flank pain and hematuria. ; ; Musculoskeletal: Negative for back pain and neck pain. Negative for swelling and trauma.; ; Skin: Negative for pruritus, rash, abrasions, blisters, bruising and skin lesion.; ; Neuro: Negative for headache, lightheadedness and neck stiffness. Negative for weakness, extremity weakness, paresthesias, +seizure.       Physical Exam Updated Vital Signs BP 104/72 (BP Location: Left Arm)   Pulse 83   Temp 98.5 F (36.9 C) (Oral)   Resp 16   Ht  (1.651 m)   Wt 54.4 kg (120 lb)   SpO2 98%   BMI 19.97 kg/m   Physical Exam 1805: Physical examination:  Nursing notes reviewed; Vital signs and O2 SAT reviewed;  Constitutional: Well developed, Well nourished, Well hydrated, In no acute distress; Head:  Normocephalic, atraumatic; Eyes: EOMI, PERRL, No scleral icterus; ENMT: Mouth and pharynx normal, Mucous membranes moist; Neck: Supple, Full range of motion, No lymphadenopathy; Cardiovascular: Regular rate and rhythm, No gallop; Respiratory: Breath sounds clear & equal bilaterally, No wheezes.  Speaking full sentences with ease, Normal respiratory effort/excursion; Chest: +VNS left chest wall without overlying erythema. Nontender, Movement normal; Abdomen: Soft, Nontender, Nondistended, Normal bowel sounds; Genitourinary: No CVA tenderness; Extremities: Pulses normal, No tenderness, No edema, No calf edema or asymmetry.; Neuro: AA&Ox3, Major CN grossly intact.  No facial droop. Speech clear. No gross focal motor or sensory deficits in extremities.; Skin: Color normal, Warm, Dry.   ED Treatments / Results  Labs (all labs ordered are listed, but only abnormal results are displayed)   EKG  EKG Interpretation None       Radiology   Procedures Procedures (including critical care time)  Medications Ordered in ED Medications - No data to display   Initial Impression /  Assessment and Plan / ED Course  I have reviewed the triage vital signs and the nursing notes.  Pertinent labs & imaging results that were available during my care of the patient were reviewed by me and considered in my medical decision making (see chart for details).  MDM Reviewed: previous chart, nursing note and vitals Interpretation: labs and x-ray   Results for orders placed or performed during the hospital encounter of 05/14/17  Pregnancy, urine  Result Value Ref Range   Preg Test, Ur NEGATIVE NEGATIVE  Urinalysis, Routine w reflex microscopic  Result Value Ref Range   Color, Urine STRAW (A) YELLOW   APPearance CLEAR CLEAR   Specific Gravity, Urine 1.005 1.005 - 1.030   pH 9.0 (H) 5.0 - 8.0   Glucose, UA NEGATIVE NEGATIVE mg/dL   Hgb urine dipstick NEGATIVE NEGATIVE   Bilirubin Urine NEGATIVE NEGATIVE   Ketones, ur NEGATIVE NEGATIVE mg/dL  Protein, ur NEGATIVE NEGATIVE mg/dL   Nitrite NEGATIVE NEGATIVE   Leukocytes, UA NEGATIVE NEGATIVE  I-stat Chem 8, ED  Result Value Ref Range   Sodium 140 135 - 145 mmol/L   Potassium 3.8 3.5 - 5.1 mmol/L   Chloride 107 101 - 111 mmol/L   BUN 18 6 - 20 mg/dL   Creatinine, Ser 1.61 0.44 - 1.00 mg/dL   Glucose, Bld 92 65 - 99 mg/dL   Calcium, Ion 0.96 0.45 - 1.40 mmol/L   TCO2 22 22 - 32 mmol/L   Hemoglobin 12.2 12.0 - 15.0 g/dL   HCT 40.9 81.1 - 91.4 %   Dg Chest 2 View Result Date: 05/14/2017 CLINICAL DATA:  Patient with headache.  Nausea. EXAM: CHEST  2 VIEW COMPARISON:  None. FINDINGS: Vagal nerve stimulator left hemithorax. Normal cardiac and mediastinal contours. No consolidative pulmonary opacities. No pleural effusion or pneumothorax. Regional skeleton is unremarkable. Nipple shadows project over the lower thorax bilaterally. IMPRESSION: No acute cardiopulmonary process. Electronically Signed   By: Annia Belt M.D.   On: 05/14/2017 18:47    1935:  Pt has tol PO well without N/V. Workup reassuring. Pt feels at her baseline and  is ready to go home now. Pt with known seizure disorder who skipped 2 days of meds. Pt endorses again that she took her meds today already and does not need a dose now. Dx and testing d/w pt and family.  Questions answered.  Verb understanding, agreeable to d/c home with outpt f/u.   Final Clinical Impressions(s) / ED Diagnoses   Final diagnoses:  None    New Prescriptions New Prescriptions   No medications on file     Samuel Jester, DO 05/17/17 1545

## 2017-05-14 NOTE — Discharge Instructions (Signed)
Take your usual prescriptions as previously directed.  Call your regular medical doctor and the Neurologist on Monday to schedule a follow up appointment next week.  Return to the Emergency Department immediately sooner if worsening.

## 2017-05-14 NOTE — ED Triage Notes (Signed)
Per EMS pt was painting and had three "silent seizures" and then she had a grand mal seizure that lasted approximately five seconds.  Pt has an implant left upper chest called VNS (vagal nerve stimulator).  Pt is currently experiencing headache and nausea.  CBG 81 was 81.  Pt is A and O X 4.  Pt denies hitting her head.

## 2017-05-14 NOTE — Telephone Encounter (Signed)
Prior authorization for Fluoxetine 20mg  received. Called [phone] spoke with Annice PihJackie who states it will be sent for review and decision faxed within 48-72 hours. Berkley HarveyAuth #[id number].

## 2017-05-17 NOTE — Telephone Encounter (Signed)
noted 

## 2017-05-21 ENCOUNTER — Telehealth (HOSPITAL_COMMUNITY): Payer: Self-pay | Admitting: *Deleted

## 2017-05-21 NOTE — Telephone Encounter (Signed)
Received fax stating Fluoxetine had been denied due to not first trying 4 of the covered medications such as: Buproprion, Citalopram, Pristiq, Duloxetine, Escitalopram, Mirtazapine, Paxil, Sertraline, Trintellix, Viibryd. Will wait for MD reply before attempting appeal.

## 2017-05-24 ENCOUNTER — Ambulatory Visit (INDEPENDENT_AMBULATORY_CARE_PROVIDER_SITE_OTHER): Payer: Medicare Other | Admitting: Licensed Clinical Social Worker

## 2017-05-24 DIAGNOSIS — F331 Major depressive disorder, recurrent, moderate: Secondary | ICD-10-CM

## 2017-05-24 NOTE — Telephone Encounter (Signed)
This is ridiculous. Prozac only costs 4$ at [facility]. She has been on it before and had a good response so she needs to continue it

## 2017-05-24 NOTE — Telephone Encounter (Signed)
Per Karie Mainland, Received fax stating Fluoxetine had been denied due to not first trying 4 of the covered medications such as: Buproprion, Citalopram, Pristiq, Duloxetine, Escitalopram, Mirtazapine, Paxil, Sertraline, Trintellix, Viibryd. Will wait for MD reply before attempting appeal
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
## 2017-05-27 ENCOUNTER — Telehealth (HOSPITAL_COMMUNITY): Payer: Self-pay

## 2017-05-27 NOTE — Telephone Encounter (Signed)
Call pharmacy to change to capsules

## 2017-05-27 NOTE — Telephone Encounter (Signed)
Medication management - Fax received from patient's [facility] requesting a new order of Fluoxetine for capsules in place of tablets per patient's request.  Pt. scheduled to return 06/02/17.

## 2017-05-28 NOTE — Telephone Encounter (Signed)
Called patient's [facility] pharmacy and spoke with Malva Cogan to inform Dr. Tenny Craw authorized patient's request to change Fluoxetine to Capsules due to cheaper cost.  Collateral reported this change was noted and would be filled as requested by patient and approved by Dr. Tenny Craw.

## 2017-06-02 ENCOUNTER — Encounter (HOSPITAL_COMMUNITY): Payer: Self-pay | Admitting: Psychiatry

## 2017-06-02 ENCOUNTER — Ambulatory Visit (INDEPENDENT_AMBULATORY_CARE_PROVIDER_SITE_OTHER): Payer: Medicare Other | Admitting: Psychiatry

## 2017-06-02 VITALS — Ht 65.0 in | Wt 125.0 lb

## 2017-06-02 DIAGNOSIS — Z87891 Personal history of nicotine dependence: Secondary | ICD-10-CM

## 2017-06-02 DIAGNOSIS — Z62810 Personal history of physical and sexual abuse in childhood: Secondary | ICD-10-CM

## 2017-06-02 DIAGNOSIS — F331 Major depressive disorder, recurrent, moderate: Secondary | ICD-10-CM | POA: Diagnosis not present

## 2017-06-02 DIAGNOSIS — G4700 Insomnia, unspecified: Secondary | ICD-10-CM | POA: Diagnosis not present

## 2017-06-02 DIAGNOSIS — Z818 Family history of other mental and behavioral disorders: Secondary | ICD-10-CM

## 2017-06-02 DIAGNOSIS — Z79899 Other long term (current) drug therapy: Secondary | ICD-10-CM

## 2017-06-02 MED ORDER — ZOLPIDEM TARTRATE 10 MG PO TABS: 10.0000 mg | ORAL_TABLET | Freq: Every evening | ORAL | 2 refills | Status: DC | PRN

## 2017-06-02 MED ORDER — FLUOXETINE HCL 20 MG PO CAPS: ORAL_CAPSULE | ORAL | 2 refills | Status: DC
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
## 2017-06-11 ENCOUNTER — Ambulatory Visit (HOSPITAL_COMMUNITY): Payer: Medicare Other | Admitting: Licensed Clinical Social Worker

## 2017-06-14 ENCOUNTER — Encounter: Payer: Self-pay | Admitting: Neurology

## 2017-06-14 ENCOUNTER — Ambulatory Visit (INDEPENDENT_AMBULATORY_CARE_PROVIDER_SITE_OTHER): Payer: Medicare Other | Admitting: Neurology

## 2017-06-14 VITALS — BP 91/57 | HR 65 | Ht 65.0 in | Wt 124.5 lb

## 2017-06-14 DIAGNOSIS — F331 Major depressive disorder, recurrent, moderate: Secondary | ICD-10-CM

## 2017-06-14 DIAGNOSIS — R569 Unspecified convulsions: Secondary | ICD-10-CM

## 2017-06-14 MED ORDER — CLOBAZAM 20 MG PO TABS: 20.0000 mg | ORAL_TABLET | Freq: Every day | ORAL | 5 refills | Status: DC

## 2017-06-14 NOTE — Progress Notes (Signed)
PATIENT: Loretta Padilla DOB: 10/10/1985  Chief Complaint  Patient presents with  . Seizures    She is here with her boyfriend, Loretta Padilla.  She has been having seizures since age 31.  She is currently taking Topamax ,BID and she has a VNS.  She has just moved here from [place] and is establishing neurological care.  Says her VNS was last checked in June 2018, prior to her moving here (Dr. Caryl Padilla).  Reports seizure activity every few weeks with her most recent event being three weeks ago. Denies missing any medication.  Marland Kitchen PCP    Loretta Gully, MD     HISTORICAL  Loretta Padilla is a 31 year old female, accompanied by her boyfriend Loretta Padilla, seen in refer by her primary care doctor  Loretta Padilla, for evaluation of seizure, initial evaluation was October 8th 2018.  I reviewed and summarized the referring note, she had a past medical history of depression, epilepsy, is taking Topamax 100 mg, 2 tablets twice a day, She recently moved from [place] to [place] in June 2018,  She was previously under the care of local neurologist Loretta Sharee Holster, MD (Phone: [phone]; Fax:  [phone]; Locations [facility]- [phone] [employer] [phone] [address] [place], [place] [address]).  She reported a history of epilepsy since 31 years old, she used to have generalized tonic-clonic seizure, tried different medications, has been on stable dose of Topamax 100 mg 2 tablets twice a day for many years, but because of frequent recurrent seizures, eventually had VNS placement in December 2014, she reported mild improvement with the VNS, post surgical one year, she has not had generalized seizure, but began to have recurrent smaller spells, staring into the space, unresponsive for few minutes, she began to have increased his spells again 2017, now having a spell on a monthly basis, rarely generalized tonic-clonic seizures,  Recent adjustment for her VNS was in June  2018, she noticed wearing off every 5 minutes, with mild left neck pain,   I reviewed the laboratory evaluation in September 2018: Normal CMP, CBC, hemoglobin of 13.2   REVIEW OF SYSTEMS: Full 14 system review of systems performed and notable only for fatigue, ringing ears, spinning sensation, blurry vision, cough, joint pain, cramps, achy muscles, memory loss, confusion, headaches, numbness, dizziness, seizure, insomnia, sleepiness, depression anxiety  ALLERGIES: Allergies  Allergen Reactions  . Carbatrol [Carbamazepine] Rash  . Dilantin [Phenytoin Sodium Extended] Rash  . Tizanidine Hcl Rash  . Venlafaxine Rash    HOME MEDICATIONS: Current Outpatient Prescriptions  Medication Sig Dispense Refill  . FLUoxetine (PROZAC) 20 MG capsule Take three capsules daily 90 capsule 2  . HYDROcodone-acetaminophen (NORCO/VICODIN) 5-325 MG tablet Take 1 tablet by mouth every 6 (six) hours as needed. 5 tablet 0  . ibuprofen (ADVIL,MOTRIN) 800 MG tablet Take 1 tablet (800 mg total) by mouth 3 (three) times daily. 21 tablet 0  . IUD'S IU by Intrauterine route.    . methocarbamol (ROBAXIN) 500 MG tablet Take 1 tablet (500 mg total) by mouth 2 (two) times daily as needed for muscle spasms. 20 tablet 0  . ondansetron (ZOFRAN ODT) 4 MG disintegrating tablet Take 1 tablet (4 mg total) by mouth every 8 (eight) hours as needed for nausea or vomiting. 6 tablet 0  . topiramate (TOPAMAX) 100 MG tablet Take 200 mg by mouth 2 (two) times daily.    Marland Kitchen zolpidem (AMBIEN) 10 MG tablet Take 1 tablet (10 mg total) by mouth at bedtime  as needed for sleep. 30 tablet 2   No current facility-administered medications for this visit.     PAST MEDICAL HISTORY: Past Medical History:  Diagnosis Date  . Anxiety   . Back pain   . Depression   . Kidney stones   . Seizures (HCC)     PAST SURGICAL HISTORY: Past Surgical History:  Procedure Laterality Date  . IMPLANTATION VAGAL NERVE STIMULATOR    . VSD REPAIR       FAMILY HISTORY: Family History  Problem Relation Age of Onset  . Bipolar disorder Mother   . Alcohol abuse Mother   . Depression Sister   . Anxiety disorder Sister   . Alcohol abuse Maternal Grandfather   . Alcohol abuse Paternal Grandfather   . Other Father        unsure of history    SOCIAL HISTORY:  Social History   Social History  . Marital status: Divorced    Spouse name: N/A  . Number of children: 3  . Years of education: 3 years college   Occupational History  . Disabled    Social History Main Topics  . Smoking status: Former Smoker    Packs/day: 1.00  . Smokeless tobacco: Never Used     Comment: 05-07-2017 per pt she stopped 3 wks from this date  . Alcohol use Yes     Comment: occ., 05-07-2017 per pt 2-3 times a mth  . Drug use: No     Comment: 05-07-2017 - Stopped Marijuana in 11-2016  . Sexual activity: Yes   Other Topics Concern  . Not on file   Social History Narrative   Lives at home with boyfriend and children.   Right-handed.   Occasional use of caffeine.           PHYSICAL EXAM   Vitals:   06/14/17 1126  BP: (!) 91/57  Pulse: 65  Weight: 124 lb 8 oz (56.5 kg)  Height:  (1.651 m)    Not recorded      Body mass index is 20.72 kg/m.  PHYSICAL EXAMNIATION:  Gen: NAD, conversant, well nourised, obese, well groomed                     Cardiovascular: Regular rate rhythm, no peripheral edema, warm, nontender. Eyes: Conjunctivae clear without exudates or hemorrhage Neck: Supple, no carotid bruits. Pulmonary: Clear to auscultation bilaterally   NEUROLOGICAL EXAM:  MENTAL STATUS: Speech:    Speech is normal; fluent and spontaneous with normal comprehension.  Cognition:     Orientation to time, place and person     Normal recent and remote memory     Normal Attention span and concentration     Normal Language, naming, repeating,spontaneous speech     Fund of knowledge   CRANIAL NERVES: CN II: Visual fields are full to  confrontation. Fundoscopic exam is normal with sharp discs and no vascular changes. Pupils are round equal and briskly reactive to light. CN III, IV, VI: extraocular movement are normal. No ptosis. CN V: Facial sensation is intact to pinprick in all 3 divisions bilaterally. Corneal responses are intact.  CN VII: Face is symmetric with normal eye closure and smile. CN VIII: Hearing is normal to rubbing fingers CN IX, X: Palate elevates symmetrically. Phonation is normal. CN XI: Head turning and shoulder shrug are intact CN XII: Tongue is midline with normal movements and no atrophy.  MOTOR: There is no pronator drift of out-stretched arms. Muscle bulk  and tone are normal. Muscle strength is normal.  REFLEXES: Reflexes are 2+ and symmetric at the biceps, triceps, knees, and ankles. Plantar responses are flexor.  SENSORY: Intact to light touch, pinprick, positional sensation and vibratory sensation are intact in fingers and toes.  COORDINATION: Rapid alternating movements and fine finger movements are intact. There is no dysmetria on finger-to-nose and heel-knee-shin.    GAIT/STANCE: Posture is normal. Gait is steady with normal steps, base, arm swing, and turning. Heel and toe walking are normal. Tandem gait is normal.  Romberg is absent.   DIAGNOSTIC DATA (LABS, IMAGING, TESTING) - I reviewed patient records, labs, notes, testing and imaging myself where available.   ASSESSMENT AND PLAN  Loretta Padilla is a 31 y.o. female    Epilepsy  On Topamax 100 mg 2 tablets twice a day, still has frequent recurrent spells on a monthly basis.  Keep current dose of Topamax, add on Onfi  qhs.    VNS:  Model Demipulse 103, serial [id number], Implant time 2013-08-21.  Generator Serial No. [id number]  Normal OutPut Current: 1.5 mA Signal Frequency: 20 Hz  Pulse Width:  250  sec Signal on time: 30 seconds Signal off time:  5 minute     Magnetic Output Current: 1.75 mA Pulse Width: 250  sec Signal On Time:  60 Sec Diagnostic Impedance:   OHMS  l confirmed the VNS settings. The VNS stimulator was interrogated and reprogrammed as above setting (16109)      Levert Feinstein, M.D. Ph.D.  [facility] [address], [address] [place], [place] [address] Ph: [phone] Fax: [phone]  CC: Loretta Gully, MD

## 2017-06-16 ENCOUNTER — Telehealth (HOSPITAL_COMMUNITY): Payer: Self-pay | Admitting: *Deleted

## 2017-06-16 NOTE — Telephone Encounter (Signed)
Called for expedited appeal of Prozac. Decision in 72 hours or less will be faxed to office.

## 2017-06-25 ENCOUNTER — Ambulatory Visit (HOSPITAL_COMMUNITY): Payer: Self-pay | Admitting: Licensed Clinical Social Worker

## 2017-06-29 ENCOUNTER — Encounter: Payer: Self-pay | Admitting: Neurology

## 2017-06-30 ENCOUNTER — Ambulatory Visit (INDEPENDENT_AMBULATORY_CARE_PROVIDER_SITE_OTHER): Payer: Medicare Other | Admitting: Neurology

## 2017-06-30 ENCOUNTER — Encounter (HOSPITAL_COMMUNITY): Payer: Self-pay | Admitting: Psychiatry

## 2017-06-30 ENCOUNTER — Ambulatory Visit (INDEPENDENT_AMBULATORY_CARE_PROVIDER_SITE_OTHER): Payer: Medicare Other | Admitting: Psychiatry

## 2017-06-30 VITALS — BP 93/50 | HR 58 | Ht 65.0 in | Wt 125.0 lb

## 2017-06-30 DIAGNOSIS — Z91410 Personal history of adult physical and sexual abuse: Secondary | ICD-10-CM

## 2017-06-30 DIAGNOSIS — F331 Major depressive disorder, recurrent, moderate: Secondary | ICD-10-CM | POA: Diagnosis not present

## 2017-06-30 DIAGNOSIS — Z818 Family history of other mental and behavioral disorders: Secondary | ICD-10-CM | POA: Diagnosis not present

## 2017-06-30 DIAGNOSIS — R450 Nervousness: Secondary | ICD-10-CM | POA: Diagnosis not present

## 2017-06-30 DIAGNOSIS — R569 Unspecified convulsions: Secondary | ICD-10-CM

## 2017-06-30 DIAGNOSIS — F419 Anxiety disorder, unspecified: Secondary | ICD-10-CM

## 2017-06-30 DIAGNOSIS — Z62810 Personal history of physical and sexual abuse in childhood: Secondary | ICD-10-CM | POA: Diagnosis not present

## 2017-06-30 DIAGNOSIS — Z811 Family history of alcohol abuse and dependence: Secondary | ICD-10-CM | POA: Diagnosis not present

## 2017-06-30 DIAGNOSIS — Z91411 Personal history of adult psychological abuse: Secondary | ICD-10-CM | POA: Diagnosis not present

## 2017-06-30 DIAGNOSIS — Z87891 Personal history of nicotine dependence: Secondary | ICD-10-CM

## 2017-06-30 DIAGNOSIS — Z736 Limitation of activities due to disability: Secondary | ICD-10-CM | POA: Diagnosis not present

## 2017-06-30 MED ORDER — FLUOXETINE HCL 20 MG PO CAPS: ORAL_CAPSULE | ORAL | 2 refills | Status: DC
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
## 2017-07-02 NOTE — Procedures (Signed)
   HISTORY: 31 year old female with history of seizure since age 31  TECHNIQUE:  16 channel EEG was performed based on standard 10-16 international system. One channel was dedicated to EKG, which has demonstrates normal sinus rhythm of 6 beats per minutes.  Upon awakening, the posterior background activity was well-developed, in alpha range, reactive to eye opening and closure.  There was no evidence of epileptiform discharge. There are frequent electrode artifact.    Photic stimulation was performed, which induced a symmetric photic driving.  Hyperventilation was performed, there was no abnormality elicit.  No sleep was achieved.  CONCLUSION: This is a  normal awake EEG.  There is no electrodiagnostic evidence of epileptiform discharge.  Levert FeinsteinYijun Jamerius Boeckman, M.D. Ph.D.  [facility] [address] [place], [place] [address] Phone: [phone] Fax:      [phone]

## 2017-07-08 ENCOUNTER — Encounter: Payer: Self-pay | Admitting: Neurology

## 2017-07-12 ENCOUNTER — Ambulatory Visit (HOSPITAL_COMMUNITY): Payer: Medicare Other | Admitting: Licensed Clinical Social Worker

## 2017-07-13 ENCOUNTER — Ambulatory Visit (INDEPENDENT_AMBULATORY_CARE_PROVIDER_SITE_OTHER): Payer: Medicare Other | Admitting: Licensed Clinical Social Worker

## 2017-07-13 ENCOUNTER — Telehealth (HOSPITAL_COMMUNITY): Payer: Self-pay | Admitting: *Deleted

## 2017-07-13 DIAGNOSIS — F331 Major depressive disorder, recurrent, moderate: Secondary | ICD-10-CM | POA: Diagnosis not present

## 2017-07-13 NOTE — Telephone Encounter (Signed)
Received fax from [employer] stating that appeal for Fluoxetine has been approved. Auth #[id number] vailid 05/14/17-09/06/18. Called to notify pharmacy. Case #[id number] EY.

## 2017-07-14 ENCOUNTER — Encounter (HOSPITAL_COMMUNITY): Payer: Self-pay | Admitting: Licensed Clinical Social Worker

## 2017-07-14 NOTE — Telephone Encounter (Signed)
noted 
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
## 2017-07-21 DIAGNOSIS — R739 Hyperglycemia, unspecified: Secondary | ICD-10-CM | POA: Diagnosis not present

## 2017-07-21 DIAGNOSIS — I10 Essential (primary) hypertension: Secondary | ICD-10-CM | POA: Diagnosis not present

## 2017-07-21 DIAGNOSIS — E785 Hyperlipidemia, unspecified: Secondary | ICD-10-CM | POA: Diagnosis not present

## 2017-07-21 DIAGNOSIS — Z1389 Encounter for screening for other disorder: Secondary | ICD-10-CM | POA: Diagnosis not present

## 2017-07-21 DIAGNOSIS — Z23 Encounter for immunization: Secondary | ICD-10-CM | POA: Diagnosis not present

## 2017-07-21 DIAGNOSIS — Z0000 Encounter for general adult medical examination without abnormal findings: Secondary | ICD-10-CM | POA: Diagnosis not present

## 2017-07-21 DIAGNOSIS — F339 Major depressive disorder, recurrent, unspecified: Secondary | ICD-10-CM | POA: Diagnosis not present

## 2017-07-21 DIAGNOSIS — G40802 Other epilepsy, not intractable, without status epilepticus: Secondary | ICD-10-CM | POA: Diagnosis not present

## 2017-08-03 ENCOUNTER — Encounter: Payer: Self-pay | Admitting: Women's Health

## 2017-08-03 ENCOUNTER — Ambulatory Visit (INDEPENDENT_AMBULATORY_CARE_PROVIDER_SITE_OTHER): Payer: Medicare Other | Admitting: Women's Health

## 2017-08-03 VITALS — BP 96/52 | HR 78 | Ht 64.0 in | Wt 125.0 lb

## 2017-08-03 DIAGNOSIS — Z3009 Encounter for other general counseling and advice on contraception: Secondary | ICD-10-CM

## 2017-08-03 DIAGNOSIS — F17200 Nicotine dependence, unspecified, uncomplicated: Secondary | ICD-10-CM | POA: Diagnosis not present

## 2017-08-03 NOTE — Progress Notes (Signed)
   GYN VISIT Patient name: Loretta Padilla MRN [id number]  Date of birth: 08/09/1986 Chief Complaint:   Contraception (IUD fell out, not sure what she wants to use)  History of Present Illness:   Loretta Padilla is a 31 y.o. [phone] Caucasian female being seen today as a new pt for report of Liletta IUD falling out a few weeks ago. She moved here from [place] a few months ago. Had Liletta placed in May before she left. Had IUDs before w/o problems. A few weeks ago, started having bleeding when it wasn't time for period, and then pulled out tampon, and IUD came out w/ it. Bled heavy for a few days after, then stopped. No pain or problems since. Has not had sex since. Has epilepsy is on topamax and Onfi- last seizure few months ago, does have neurologist in [place]. Has vagus nerve stimulator.  Also has depression- is on prozac managed by Dr. Tenny Crawoss.  Does smoke 1/2-1ppd, is trying to quit. No h/o HTN, DVT/PE, CVA, MI, or migraines w/ aura. Wants to do a little more research on her contraception options. Has been on depo in past and liked it, but doesn't think she'd be able to come q 3mths for injection. Doesn't think she would do good w/ pills daily. Is allergic to adhesive on patch. Doesn't want another IUD.  No LMP recorded (lmp unknown). The current method of family planning is abstinence. Last pap May 2018 in [place]. Results were:  normal Review of Systems:   Pertinent items are noted in HPI Denies fever/chills, dizziness, headaches, visual disturbances, fatigue, shortness of breath, chest pain, abdominal pain, vomiting, abnormal vaginal discharge/itching/odor/irritation, problems with periods, bowel movements, urination, or intercourse unless otherwise stated above.  Pertinent History Reviewed:  Reviewed past medical,surgical, social, obstetrical and family history.  Reviewed problem list, medications and allergies. Physical Assessment:   Vitals:   08/03/17 1149  BP: (!) 96/52  Pulse: 78    Weight: 125 lb (56.7 kg)  Height: 5\' 4"  (1.626 m)  Body mass index is 21.46 kg/m.       Physical Examination:   General appearance: alert, well appearing, and in no distress  Mental status: alert, oriented to person, place, and time  Skin: warm & dry   Cardiovascular: normal heart rate noted, HRRR  Respiratory: normal respiratory effort, no distress, LCTAB  Abdomen: soft, non-tender   Pelvic: examination not indicated  Extremities: no edema   No results found for this or any previous visit (from the past 24 hour(s)).  Assessment & Plan:  1) IUD expulsion  2) Contraception counseling> discussed all options, and that topamax can decrease effectiveness of all contraception. Also discussed smoking w/ estrogen can increase r/f DVT/PE, MI, CVA, HTN, etc and smoking cessation is advised. Wants to think about/research options- will let us know what she decides. Plans condoms for now.   3) Smoker> advised cessation  4) Depression> on prozac, managed by Dr. Tenny Crawoss  5) Epilepsy> on topamax and Onfi, managed by neurologist in [place], also has vagus nerve stimulator  No orders of the defined types were placed in this encounter.   Return for pt to call back.  Marge DuncansBooker, Kaisa Wofford Randall CNM, [facility]NP-BC 08/03/2017 12:58 PM

## 2017-08-04 ENCOUNTER — Ambulatory Visit (HOSPITAL_COMMUNITY): Payer: Medicare Other | Admitting: Licensed Clinical Social Worker

## 2017-08-19 ENCOUNTER — Encounter: Payer: Self-pay | Admitting: Women's Health

## 2017-08-24 ENCOUNTER — Ambulatory Visit (INDEPENDENT_AMBULATORY_CARE_PROVIDER_SITE_OTHER): Payer: Medicare Other | Admitting: Advanced Practice Midwife

## 2017-08-24 ENCOUNTER — Encounter: Payer: Self-pay | Admitting: Advanced Practice Midwife

## 2017-08-24 VITALS — BP 80/60 | HR 97 | Ht 65.0 in | Wt 125.0 lb

## 2017-08-24 DIAGNOSIS — Z113 Encounter for screening for infections with a predominantly sexual mode of transmission: Secondary | ICD-10-CM

## 2017-08-24 DIAGNOSIS — N760 Acute vaginitis: Secondary | ICD-10-CM | POA: Diagnosis not present

## 2017-08-24 MED ORDER — METRONIDAZOLE 500 MG PO TABS: 500.0000 mg | ORAL_TABLET | Freq: Two times a day (BID) | ORAL | 0 refills | Status: DC

## 2017-08-24 NOTE — Progress Notes (Signed)
[facility] Visit  Patient name: Loretta Padilla MRN [id number]  Date of birth: 06/21/1986  CC & HPI:  Loretta Padilla is a 31 y.o. Caucasian female presenting today for STD testing.  She is in an 8 month relationship, but both had previous partners that cheated . Has oticed a white malodorous discharge for a few weeks. They want to try to have a baby soon.   Pertinent History Reviewed:  Medical & Surgical Hx:   Past Medical History:  Diagnosis Date  . Anxiety   . Back pain   . Depression   . Kidney stones   . Seizures (HCC)    Past Surgical History:  Procedure Laterality Date  . IMPLANTATION VAGAL NERVE STIMULATOR    . VSD REPAIR     Family History  Problem Relation Age of Onset  . Bipolar disorder Mother   . Alcohol abuse Mother   . Depression Sister   . Anxiety disorder Sister   . Alcohol abuse Maternal Grandfather   . Alcohol abuse Paternal Grandfather   . Other Father        unsure of history    Current Outpatient Medications:  .  cloBAZam (ONFI) 20 MG tablet, Take 1 tablet (20 mg total) by mouth at bedtime., Disp: 30 tablet, Rfl: 5 .  FLUoxetine (PROZAC) 20 MG capsule, Take three capsules daily, Disp: 90 capsule, Rfl: 2 .  topiramate (TOPAMAX) 100 MG tablet, Take 200 mg by mouth 2 (two) times daily., Disp: , Rfl:  .  zolpidem (AMBIEN) 10 MG tablet, Take 1 tablet (10 mg total) by mouth at bedtime as needed for sleep., Disp: 30 tablet, Rfl: 2 .  HYDROcodone-acetaminophen (NORCO/VICODIN) 5-325 MG tablet, Take 1 tablet by mouth every 6 (six) hours as needed. (Patient not taking: Reported on 08/03/2017), Disp: 5 tablet, Rfl: 0 .  ibuprofen (ADVIL,MOTRIN) 800 MG tablet, Take 1 tablet (800 mg total) by mouth 3 (three) times daily. (Patient not taking: Reported on 08/24/2017), Disp: 21 tablet, Rfl: 0 .  methocarbamol (ROBAXIN) 500 MG tablet, Take 1 tablet (500 mg total) by mouth 2 (two) times daily as needed for muscle spasms. (Patient not taking: Reported on  08/24/2017), Disp: 20 tablet, Rfl: 0 .  ondansetron (ZOFRAN ODT) 4 MG disintegrating tablet, Take 1 tablet (4 mg total) by mouth every 8 (eight) hours as needed for nausea or vomiting. (Patient not taking: Reported on 08/24/2017), Disp: 6 tablet, Rfl: 0 Social History: Reviewed -  reports that she has quit smoking. She smoked 1.00 pack per day. she has never used smokeless tobacco.  Review of Systems:   Constitutional: Negative for fever and chills Eyes: Negative for visual disturbances Respiratory: Negative for shortness of breath, dyspnea Cardiovascular: Negative for chest pain or palpitations  Gastrointestinal: Negative for vomiting, diarrhea and constipation; no abdominal pain Genitourinary: Negative for dysuria and urgency, vaginal irritation or itching Musculoskeletal: Negative for back pain, joint pain, myalgias  Neurological: Negative for dizziness and headaches    Objective Findings:    Physical Examination: General appearance - well appearing, and in no distress Mental status - alert, oriented to person, place, and time Chest:  Normal respiratory effort Heart - normal rate and regular rhythm Abdomen:  Soft, nontender Pelvic: white thin dc w/aminie odor.  Wet prep + clue (few), no trich, few WBC and no yeast.  Musculoskeletal:  Normal range of motion without pain Extremities:  No edema    No results found for this or any previous visit (from the  past 24 hour(s)).    Assessment & Plan:  A:   Std SCREENING  BV P:  Flagyl 500mg  BID X 7 Start PNV   Return for If you have any problems.  CRESENZO-DISHMAN,Virna Livengood CNM 08/24/2017 3:54 PM

## 2017-08-25 ENCOUNTER — Encounter: Payer: Self-pay | Admitting: Advanced Practice Midwife

## 2017-08-25 LAB — HIV ANTIBODY (ROUTINE TESTING W REFLEX): HIV Screen 4th Generation wRfx: NONREACTIVE

## 2017-08-25 LAB — HCV COMMENT:

## 2017-08-25 LAB — RPR: RPR Ser Ql: NONREACTIVE

## 2017-08-25 LAB — HEPATITIS C ANTIBODY (REFLEX): HCV Ab: <0.1 s/co ratio (ref 0.0–0.9)

## 2017-08-25 LAB — HEPATITIS B SURFACE ANTIGEN: Hepatitis B Surface Ag: NEGATIVE

## 2017-08-26 ENCOUNTER — Ambulatory Visit (INDEPENDENT_AMBULATORY_CARE_PROVIDER_SITE_OTHER): Payer: Medicare Other | Admitting: Psychiatry

## 2017-08-26 ENCOUNTER — Encounter (HOSPITAL_COMMUNITY): Payer: Self-pay | Admitting: Psychiatry

## 2017-08-26 VITALS — BP 96/60 | HR 76 | Ht 65.0 in | Wt 124.0 lb

## 2017-08-26 DIAGNOSIS — G40909 Epilepsy, unspecified, not intractable, without status epilepticus: Secondary | ICD-10-CM | POA: Diagnosis not present

## 2017-08-26 DIAGNOSIS — Z811 Family history of alcohol abuse and dependence: Secondary | ICD-10-CM

## 2017-08-26 DIAGNOSIS — F331 Major depressive disorder, recurrent, moderate: Secondary | ICD-10-CM | POA: Diagnosis not present

## 2017-08-26 DIAGNOSIS — Z818 Family history of other mental and behavioral disorders: Secondary | ICD-10-CM

## 2017-08-26 DIAGNOSIS — G4700 Insomnia, unspecified: Secondary | ICD-10-CM | POA: Diagnosis not present

## 2017-08-26 DIAGNOSIS — Z87891 Personal history of nicotine dependence: Secondary | ICD-10-CM | POA: Diagnosis not present

## 2017-08-26 DIAGNOSIS — M549 Dorsalgia, unspecified: Secondary | ICD-10-CM | POA: Diagnosis not present

## 2017-08-26 DIAGNOSIS — G8929 Other chronic pain: Secondary | ICD-10-CM

## 2017-08-26 MED ORDER — TEMAZEPAM 15 MG PO CAPS: 15.0000 mg | ORAL_CAPSULE | Freq: Every evening | ORAL | 2 refills | Status: DC | PRN

## 2017-08-26 MED ORDER — FLUOXETINE HCL 20 MG PO CAPS: ORAL_CAPSULE | ORAL | 2 refills | Status: DC
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
## 2017-08-28 LAB — NUSWAB VAGINITIS PLUS (VG+)
Atopobium vaginae: HIGH Score — AB
BVAB 2: HIGH Score — AB
Candida albicans, NAA: NEGATIVE
Candida glabrata, NAA: NEGATIVE
Chlamydia trachomatis, NAA: NEGATIVE
Megasphaera 1: HIGH Score — AB
Neisseria gonorrhoeae, NAA: NEGATIVE
Trich vag by NAA: NEGATIVE

## 2017-09-22 ENCOUNTER — Ambulatory Visit (INDEPENDENT_AMBULATORY_CARE_PROVIDER_SITE_OTHER): Payer: Medicare Other | Admitting: Licensed Clinical Social Worker

## 2017-09-22 ENCOUNTER — Encounter (HOSPITAL_COMMUNITY): Payer: Self-pay | Admitting: Licensed Clinical Social Worker

## 2017-09-22 DIAGNOSIS — F331 Major depressive disorder, recurrent, moderate: Secondary | ICD-10-CM
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
## 2017-10-07 ENCOUNTER — Ambulatory Visit (HOSPITAL_COMMUNITY): Payer: Self-pay | Admitting: Psychiatry

## 2017-10-07 ENCOUNTER — Encounter: Payer: Self-pay | Admitting: Neurology

## 2017-10-08 ENCOUNTER — Encounter (HOSPITAL_COMMUNITY): Payer: Self-pay | Admitting: Licensed Clinical Social Worker

## 2017-10-08 ENCOUNTER — Telehealth: Payer: Self-pay | Admitting: Neurology

## 2017-10-08 ENCOUNTER — Ambulatory Visit (INDEPENDENT_AMBULATORY_CARE_PROVIDER_SITE_OTHER): Payer: Medicare Other | Admitting: Licensed Clinical Social Worker

## 2017-10-08 DIAGNOSIS — F331 Major depressive disorder, recurrent, moderate: Secondary | ICD-10-CM

## 2017-10-08 NOTE — Telephone Encounter (Signed)
Please call patient, check on her sleep again, we may try low dose clonazepam 0.5mg  qhs.
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
## 2017-10-11 NOTE — Telephone Encounter (Signed)
Left message requesting a return call.

## 2017-10-11 NOTE — Telephone Encounter (Signed)
Email from patient:  The sleeping meds you switched me to are not working. I am not able to fall asleep and on the rare nights I do fall asleep before 2 or 3 a.m. I have trouble staying asleep.

## 2017-10-12 ENCOUNTER — Encounter (HOSPITAL_COMMUNITY): Payer: Self-pay | Admitting: Psychiatry

## 2017-10-12 ENCOUNTER — Ambulatory Visit (INDEPENDENT_AMBULATORY_CARE_PROVIDER_SITE_OTHER): Payer: Medicare Other | Admitting: Psychiatry

## 2017-10-12 VITALS — BP 107/69 | HR 77 | Ht 65.0 in | Wt 121.0 lb

## 2017-10-12 DIAGNOSIS — Z79899 Other long term (current) drug therapy: Secondary | ICD-10-CM

## 2017-10-12 DIAGNOSIS — G4700 Insomnia, unspecified: Secondary | ICD-10-CM | POA: Diagnosis not present

## 2017-10-12 DIAGNOSIS — F331 Major depressive disorder, recurrent, moderate: Secondary | ICD-10-CM | POA: Diagnosis not present

## 2017-10-12 DIAGNOSIS — Z811 Family history of alcohol abuse and dependence: Secondary | ICD-10-CM | POA: Diagnosis not present

## 2017-10-12 DIAGNOSIS — Z818 Family history of other mental and behavioral disorders: Secondary | ICD-10-CM | POA: Diagnosis not present

## 2017-10-12 DIAGNOSIS — G40909 Epilepsy, unspecified, not intractable, without status epilepticus: Secondary | ICD-10-CM | POA: Diagnosis not present

## 2017-10-12 DIAGNOSIS — Z91411 Personal history of adult psychological abuse: Secondary | ICD-10-CM

## 2017-10-12 DIAGNOSIS — Z87891 Personal history of nicotine dependence: Secondary | ICD-10-CM

## 2017-10-12 DIAGNOSIS — Z91410 Personal history of adult physical and sexual abuse: Secondary | ICD-10-CM

## 2017-10-12 MED ORDER — FLUOXETINE HCL 20 MG PO CAPS: ORAL_CAPSULE | ORAL | 2 refills | Status: DC

## 2017-10-12 NOTE — Telephone Encounter (Signed)
Returned call to patient - she is currently taking temazepam 15mg  qhs but it is not helpful.  This medication is prescribed by her psychiatrist.  She has an appt with her today and plans to discuss a change in her sleep medications.
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
## 2017-10-12 NOTE — Patient Instructions (Signed)
belsommra samples at bedtime

## 2017-10-18 ENCOUNTER — Telehealth: Payer: Self-pay | Admitting: Neurology

## 2017-10-18 MED ORDER — TOPIRAMATE 100 MG PO TABS: 200.0000 mg | ORAL_TABLET | Freq: Two times a day (BID) | ORAL | 11 refills | Status: DC

## 2017-10-18 NOTE — Telephone Encounter (Signed)
Pt requesting a refill for topiramate (TOPAMAX) 100 MG tablet   Be sent to [id number] - [place], [place] - [address]

## 2017-10-18 NOTE — Telephone Encounter (Signed)
Rx sent to requested pharmacy

## 2017-10-26 ENCOUNTER — Ambulatory Visit (INDEPENDENT_AMBULATORY_CARE_PROVIDER_SITE_OTHER): Payer: Medicare Other | Admitting: Licensed Clinical Social Worker

## 2017-10-26 ENCOUNTER — Encounter (HOSPITAL_COMMUNITY): Payer: Self-pay | Admitting: Licensed Clinical Social Worker

## 2017-10-26 DIAGNOSIS — F331 Major depressive disorder, recurrent, moderate: Secondary | ICD-10-CM
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
## 2017-11-04 ENCOUNTER — Encounter: Payer: Self-pay | Admitting: Neurology

## 2017-11-04 ENCOUNTER — Ambulatory Visit (INDEPENDENT_AMBULATORY_CARE_PROVIDER_SITE_OTHER): Payer: Medicare Other | Admitting: Neurology

## 2017-11-04 VITALS — BP 99/59 | HR 82 | Ht 65.0 in | Wt 123.5 lb

## 2017-11-04 DIAGNOSIS — G40219 Localization-related (focal) (partial) symptomatic epilepsy and epileptic syndromes with complex partial seizures, intractable, without status epilepticus: Secondary | ICD-10-CM

## 2017-11-04 MED ORDER — TOPIRAMATE ER 200 MG PO CAP24: 400.0000 mg | ORAL_CAPSULE | Freq: Every day | ORAL | 11 refills | Status: DC

## 2017-11-04 MED ORDER — CLOBAZAM 20 MG PO TABS: 20.0000 mg | ORAL_TABLET | Freq: Two times a day (BID) | ORAL | 5 refills | Status: DC

## 2017-11-04 NOTE — Progress Notes (Signed)
PATIENT: Loretta EssexMelonie Padilla DOB: 06/24/1986  Chief Complaint  Patient presents with  . Seizures    She is here with her boyfriend, Loretta CanalesSteve.  She has been having seizures since age 32.  She is currently taking Topamax 200mg ,BID and she has a VNS.  She has just moved here from [place] and is establishing neurological care.  Says her VNS was last checked in June 2018, prior to her moving here (Dr. Caryl Bisichard Gamuac).  Reports seizure activity every few weeks with her most recent event being three weeks ago. Denies missing any medication.  Marland Kitchen. PCP    Loretta GullyFanta, [name], MD     HISTORICAL  Loretta EssexMelonie Padilla is a 32 year old female, accompanied by her boyfriend Loretta CanalesSteve, seen in refer by her primary care doctor  Loretta GullyFanta, [name], for evaluation of seizure, initial evaluation was October 8th 2018.  I reviewed and summarized the referring note, she had a past medical history of depression, epilepsy, is taking Topamax 100 mg, 2 tablets twice a day, She recently moved from [place] to [place] in June 2018,  She was previously under the care of local neurologist Dr.Richard Sharee Holster. Gamuac, MD (Phone: [phone]; Fax:  [phone]; Locations [facility]- [phone] [address] [phone] [address], [address] [place], [place] [id number]).  She reported a history of epilepsy since 32 years old, she used to have generalized tonic-clonic seizure, tried different medications, has been on stable dose of Topamax 100 mg 2 tablets twice a day for many years, but because of frequent recurrent seizures, eventually had VNS placement in December 2014, she reported mild improvement with the VNS, post surgical one year, she has not had generalized seizure, but began to have recurrent smaller spells, staring into the space, unresponsive for few minutes, she began to have increased spells again 2017, now having a spell on a monthly basis, rarely generalized tonic-clonic seizures,  Recent adjustment for her VNS was in June  2018, she noticed wearing off every 5 minutes, with mild left neck pain,   I reviewed the laboratory evaluation in September 2018: Normal CMP, CBC, hemoglobin of 13.2  UPDATE Nov 04 2017: She is now taking topamax 100mg  2 tab bid, onfi 20mg  qhs since Oct 2018,   She was having "silenct seizure' when she runs out of her Topamax 400 mg daily, she has dizziness, lightheadedness, staring off into space, confused, lasting for few second, no GTC events.  Those small seizure-like spells can happen multiple times a day.   Add on onfi 20 mg every night has made a difference, she no longer has recurrent generalized tonic-clonic seizure, before that, she was having it on a monthly basis,  She is also taking Prozac for depression, and other simple medication from her psychiatrist office, complains of chronic insomnia,  Today we also performed a VNS interrogation, adjustment, she complains of could not tolerate magnet stimulation  REVIEW OF SYSTEMS: Full 14 system review of systems performed and notable only for fatigue, ringing ears, spinning sensation, blurry vision, cough, joint pain, cramps, achy muscles, memory loss, confusion, headaches, numbness, dizziness, seizure, insomnia, sleepiness, depression anxiety  ALLERGIES: Allergies  Allergen Reactions  . Adhesive [Tape]     Birth control patch and nicoderm patch   . Carbatrol [Carbamazepine] Rash  . Dilantin [Phenytoin Sodium Extended] Rash  . Tizanidine Hcl Rash  . Venlafaxine Rash    HOME MEDICATIONS: Current Outpatient Medications  Medication Sig Dispense Refill  . Acetaminophen (TYLENOL PO) Take by mouth as needed.    .Marland Kitchen  Ascorbic Acid (VITAMIN C PO) Take by mouth daily.    . cloBAZam (ONFI) 20 MG tablet Take 1 tablet (20 mg total) by mouth at bedtime. 30 tablet 5  . FLUoxetine (PROZAC) 20 MG capsule Take three capsules daily 90 capsule 2  . topiramate (TOPAMAX) 100 MG tablet Take 2 tablets (200 mg total) by mouth 2 (two) times daily. 120  tablet 11   No current facility-administered medications for this visit.     PAST MEDICAL HISTORY: Past Medical History:  Diagnosis Date  . Anxiety   . Back pain   . Depression   . Kidney stones   . Seizures (HCC)     PAST SURGICAL HISTORY: Past Surgical History:  Procedure Laterality Date  . IMPLANTATION VAGAL NERVE STIMULATOR    . VSD REPAIR      FAMILY HISTORY: Family History  Problem Relation Age of Onset  . Bipolar disorder Mother   . Alcohol abuse Mother   . Depression Sister   . Anxiety disorder Sister   . Alcohol abuse Maternal Grandfather   . Alcohol abuse Paternal Grandfather   . Other Father        unsure of history    SOCIAL HISTORY:  Social History   Socioeconomic History  . Marital status: Divorced    Spouse name: Not on file  . Number of children: 3  . Years of education: 3 years college  . Highest education level: Not on file  Social Needs  . Financial resource strain: Not on file  . Food insecurity - worry: Not on file  . Food insecurity - inability: Not on file  . Transportation needs - medical: Not on file  . Transportation needs - non-medical: Not on file  Occupational History  . Occupation: Disabled  Tobacco Use  . Smoking status: Former Smoker    Packs/day: 1.00  . Smokeless tobacco: Never Used  . Tobacco comment: 05-07-2017 per pt she stopped 3 wks from this date  Substance and Sexual Activity  . Alcohol use: Yes    Comment: occ., 05-07-2017 per pt 2-3 times a mth  . Drug use: No    Comment: 05-07-2017 - Stopped Marijuana in 11-2016  . Sexual activity: Yes    Birth control/protection: None  Other Topics Concern  . Not on file  Social History Narrative   Lives at home with boyfriend and children.   Right-handed.   Occasional use of caffeine.        PHYSICAL EXAM   Vitals:   11/04/17 1045  BP: (!) 99/59  Pulse: 82  Weight: 123 lb 8 oz (56 kg)  Height: 5\' 5"  (1.651 m)    Not recorded      Body mass index is 20.55  kg/m.  PHYSICAL EXAMNIATION:  Gen: NAD, conversant, well nourised, obese, well groomed                     Cardiovascular: Regular rate rhythm, no peripheral edema, warm, nontender. Eyes: Conjunctivae clear without exudates or hemorrhage Neck: Supple, no carotid bruits. Pulmonary: Clear to auscultation bilaterally   NEUROLOGICAL EXAM:  MENTAL STATUS: Speech:    Speech is normal; fluent and spontaneous with normal comprehension.  Cognition:     Orientation to time, place and person     Normal recent and remote memory     Normal Attention span and concentration     Normal Language, naming, repeating,spontaneous speech     Fund of knowledge   CRANIAL NERVES:  CN II: Visual fields are full to confrontation. Fundoscopic exam is normal with sharp discs and no vascular changes. Pupils are round equal and briskly reactive to light. CN III, IV, VI: extraocular movement are normal. No ptosis. CN V: Facial sensation is intact to pinprick in all 3 divisions bilaterally. Corneal responses are intact.  CN VII: Face is symmetric with normal eye closure and smile. CN VIII: Hearing is normal to rubbing fingers CN IX, X: Palate elevates symmetrically. Phonation is normal. CN XI: Head turning and shoulder shrug are intact CN XII: Tongue is midline with normal movements and no atrophy.  MOTOR: There is no pronator drift of out-stretched arms. Muscle bulk and tone are normal. Muscle strength is normal.  REFLEXES: Reflexes are 2+ and symmetric at the biceps, triceps, knees, and ankles. Plantar responses are flexor.  SENSORY: Intact to light touch, pinprick, positional sensation and vibratory sensation are intact in fingers and toes.  COORDINATION: Rapid alternating movements and fine finger movements are intact. There is no dysmetria on finger-to-nose and heel-knee-shin.    GAIT/STANCE: Posture is normal. Gait is steady with normal steps, base, arm swing, and turning. Heel and toe walking are  normal. Tandem gait is normal.  Romberg is absent.   DIAGNOSTIC DATA (LABS, IMAGING, TESTING) - I reviewed patient records, labs, notes, testing and imaging myself where available.   ASSESSMENT AND PLAN  Lodema Scheiderer is a 32 y.o. female    Epilepsy  Change Topamax to Topamax ER 200mg  2 tabs qhs.  Increase onfi to 20mg  bid  Get medication list at her next follow up visit  VNS:  Model Demipulse 103, serial [id number], Implant time 2013-08-21.  Generator Serial No. [id number]  Normal OutPut Current: 1.25 mA Signal Frequency: 20 Hz  Pulse Width:  250  sec Signal on time: 30 seconds Signal off time:  3 minute     Magnetic Output Current: 1.50 mA Pulse Width: 250 sec Signal On Time:  60 Sec Diagnostic Impedance: 3226  OHMS  l confirmed the VNS settings. The VNS stimulator was interrogated and reprogrammed as above setting (16109)    Patient was also given education about magnet use, require at least 2 times a day magnet activation  Levert Feinstein, M.D. Ph.D.  [facility] [address] [place], [place] [address] Ph: [phone] Fax: [phone]  CC: Loretta Gully, MD

## 2017-11-16 IMAGING — DX DG CHEST 2V
2 series · 2 of 2 positions shown · non-contrast
Comparison: None.

CLINICAL DATA: Patient with headache.  Nausea.

EXAM:
CHEST  2 VIEW

[chest lat]
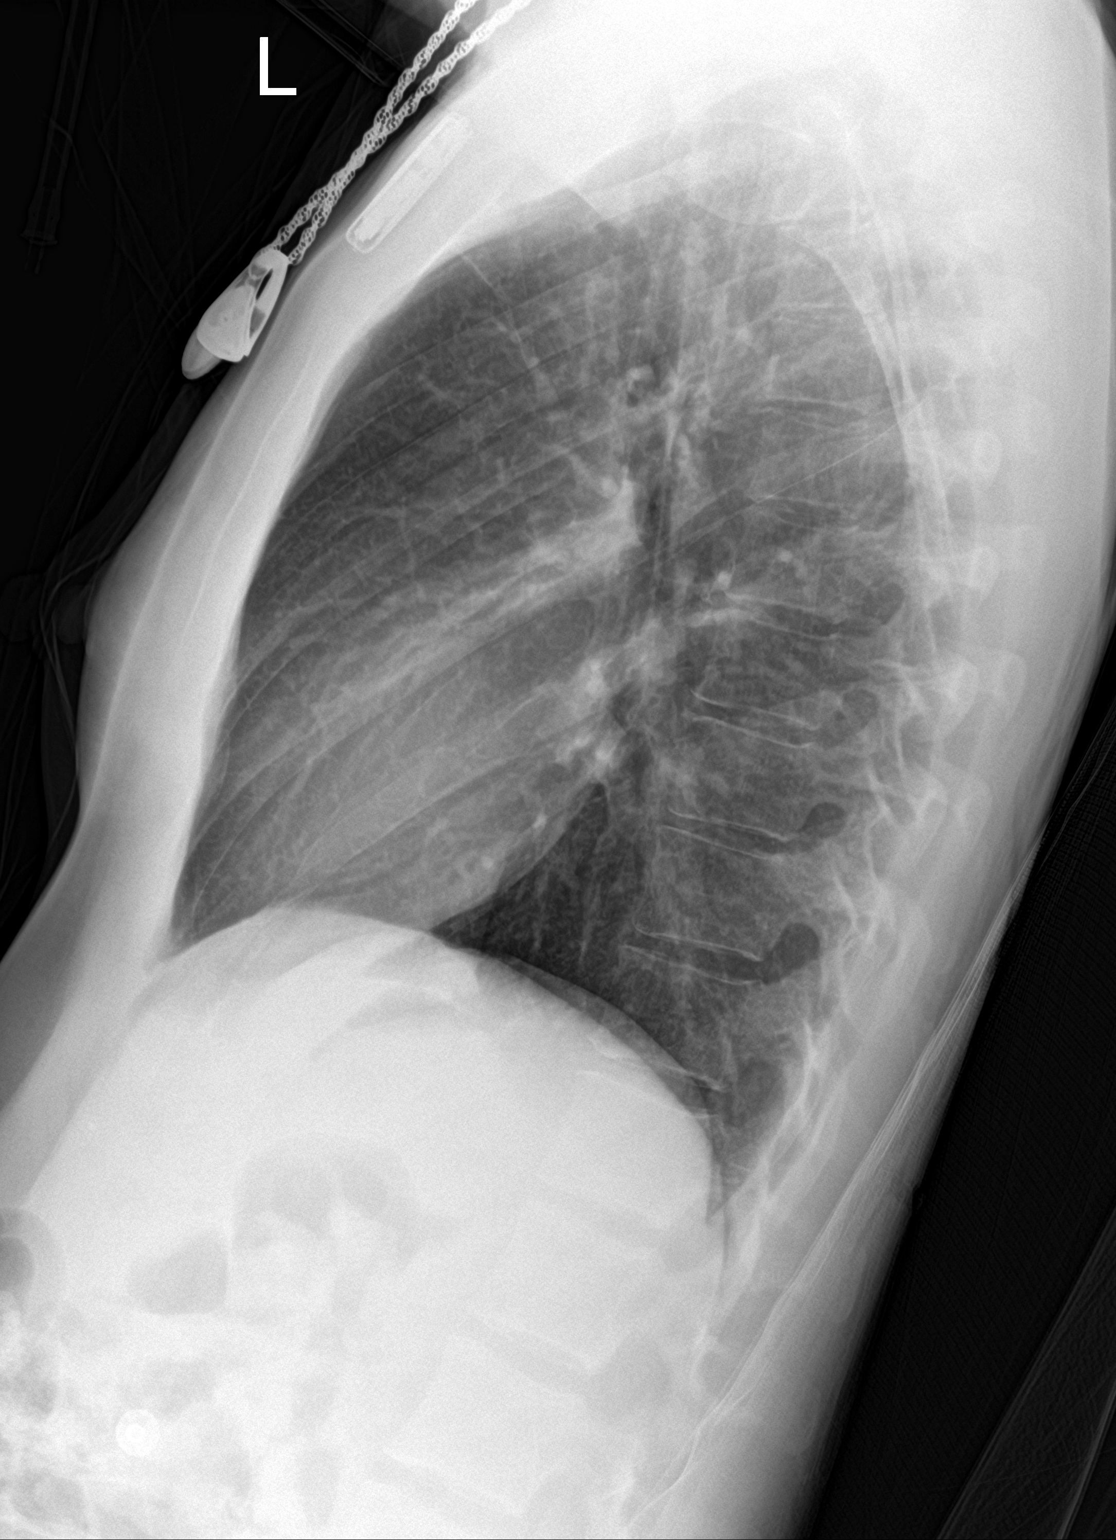

[chest ap]
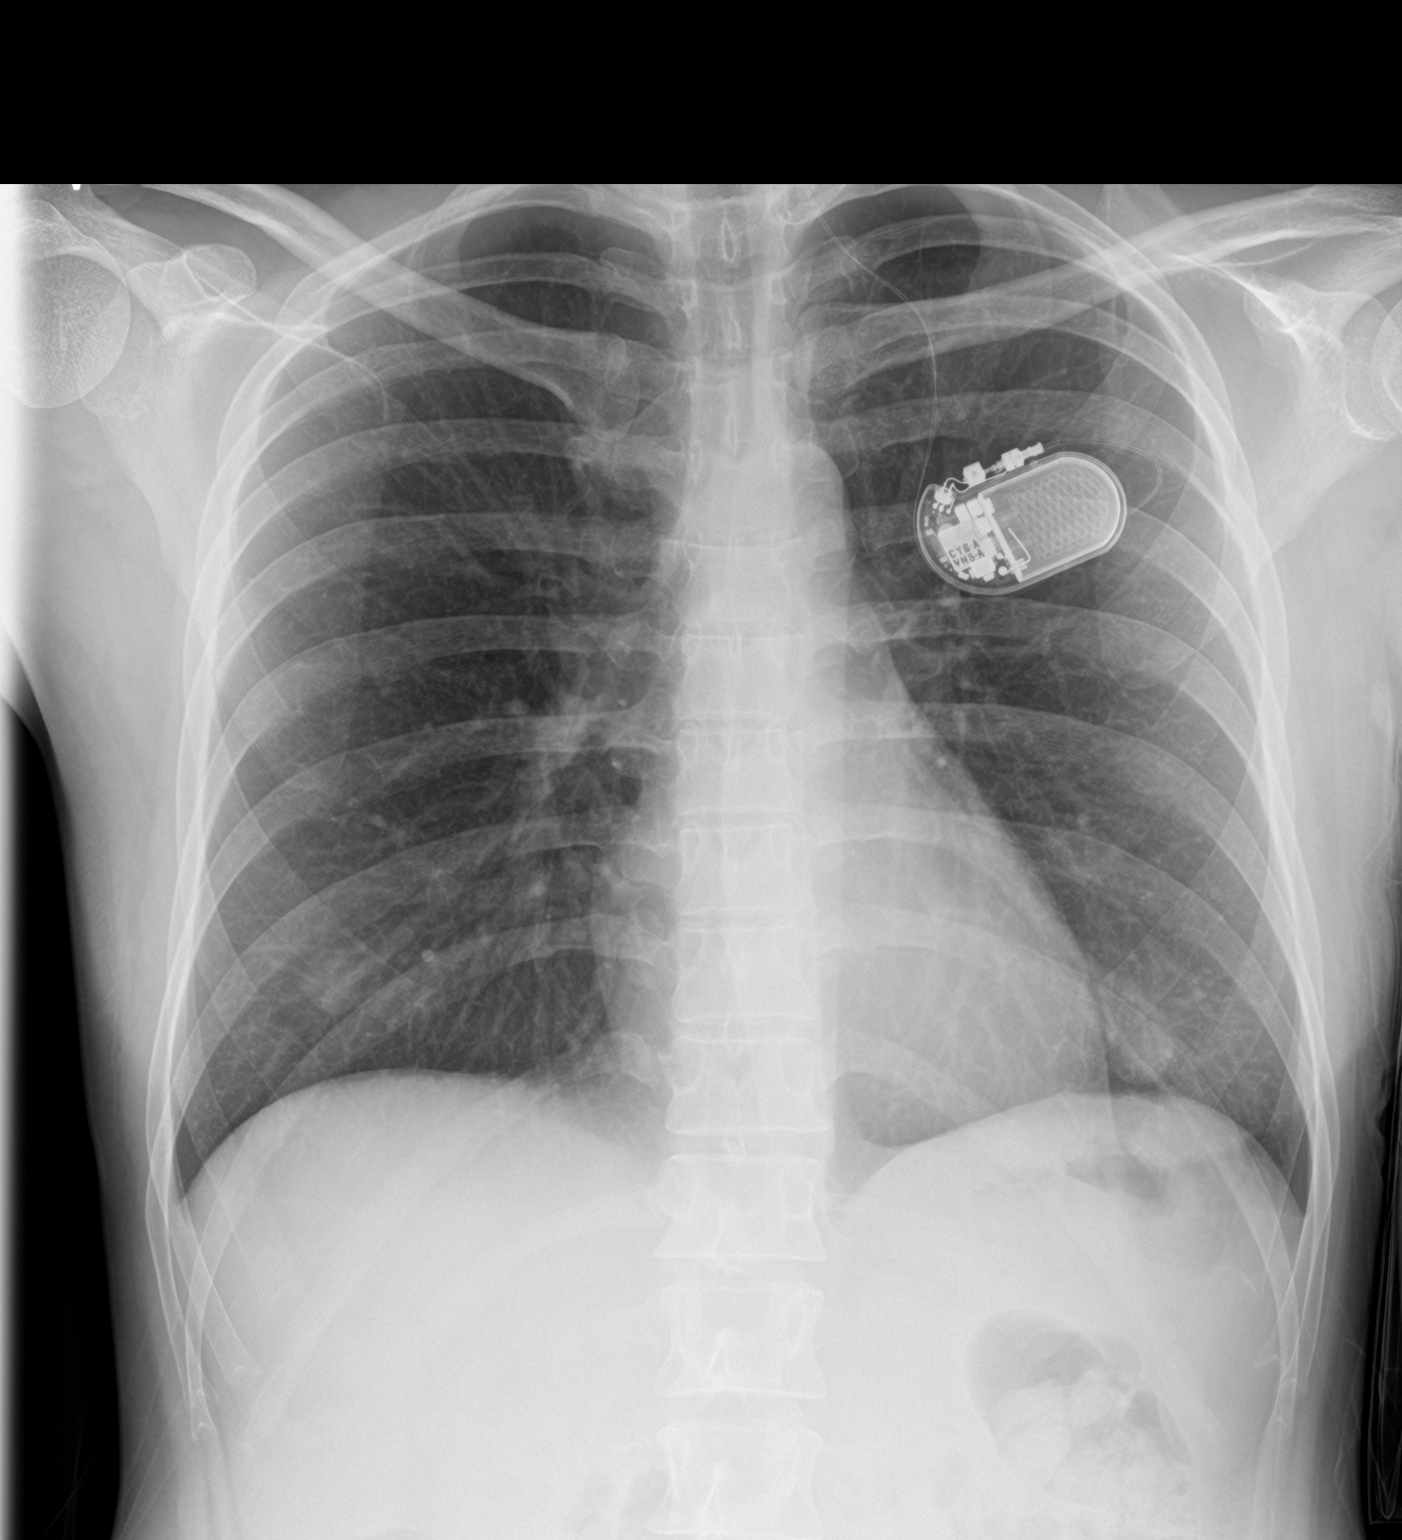

[2 of 2 positions shown; findings below may reference images not displayed]

FINDINGS: Vagal nerve stimulator left hemithorax. Normal cardiac and
mediastinal contours. No consolidative pulmonary opacities. No
pleural effusion or pneumothorax. Regional skeleton is unremarkable.
Nipple shadows project over the lower thorax bilaterally.
IMPRESSION: No acute cardiopulmonary process.

## 2017-11-18 ENCOUNTER — Ambulatory Visit (HOSPITAL_COMMUNITY): Payer: Self-pay | Admitting: Licensed Clinical Social Worker

## 2017-11-23 ENCOUNTER — Telehealth: Payer: Self-pay | Admitting: *Deleted

## 2017-11-23 ENCOUNTER — Encounter: Payer: Self-pay | Admitting: *Deleted

## 2017-11-23 DIAGNOSIS — G40909 Epilepsy, unspecified, not intractable, without status epilepticus: Secondary | ICD-10-CM | POA: Insufficient documentation

## 2017-11-23 NOTE — Telephone Encounter (Signed)
PA approved by [employer] [phone]) through 09/06/18.  Pt GN#[phone].  QI#[id number].

## 2017-12-01 ENCOUNTER — Ambulatory Visit (HOSPITAL_COMMUNITY): Payer: Self-pay | Admitting: Licensed Clinical Social Worker

## 2017-12-10 ENCOUNTER — Encounter (HOSPITAL_COMMUNITY): Payer: Self-pay | Admitting: Psychiatry

## 2017-12-10 ENCOUNTER — Telehealth: Payer: Self-pay | Admitting: Neurology

## 2017-12-10 ENCOUNTER — Ambulatory Visit (INDEPENDENT_AMBULATORY_CARE_PROVIDER_SITE_OTHER): Payer: Medicare Other | Admitting: Psychiatry

## 2017-12-10 VITALS — BP 110/54 | HR 82 | Ht 65.0 in | Wt 128.0 lb

## 2017-12-10 DIAGNOSIS — G4700 Insomnia, unspecified: Secondary | ICD-10-CM

## 2017-12-10 DIAGNOSIS — F331 Major depressive disorder, recurrent, moderate: Secondary | ICD-10-CM | POA: Diagnosis not present

## 2017-12-10 DIAGNOSIS — R450 Nervousness: Secondary | ICD-10-CM

## 2017-12-10 DIAGNOSIS — G479 Sleep disorder, unspecified: Secondary | ICD-10-CM

## 2017-12-10 DIAGNOSIS — Z811 Family history of alcohol abuse and dependence: Secondary | ICD-10-CM

## 2017-12-10 DIAGNOSIS — Z818 Family history of other mental and behavioral disorders: Secondary | ICD-10-CM | POA: Diagnosis not present

## 2017-12-10 DIAGNOSIS — R569 Unspecified convulsions: Secondary | ICD-10-CM

## 2017-12-10 DIAGNOSIS — F419 Anxiety disorder, unspecified: Secondary | ICD-10-CM

## 2017-12-10 DIAGNOSIS — Z87891 Personal history of nicotine dependence: Secondary | ICD-10-CM | POA: Diagnosis not present

## 2017-12-10 MED ORDER — FLUOXETINE HCL 20 MG PO CAPS: ORAL_CAPSULE | ORAL | 2 refills | Status: DC

## 2017-12-10 MED ORDER — CLONAZEPAM 2 MG PO TABS: 2.0000 mg | ORAL_TABLET | Freq: Every day | ORAL | 2 refills | Status: DC

## 2017-12-10 NOTE — Telephone Encounter (Signed)
-----   Message from Myrlene Brokereborah R Ross, MD sent at 12/10/2017 10:32 AM EDT ----- Regarding: sleep study Hi Dr. Terrace ArabiaYan, I saw our mutual patient Loretta Padilla today.  She is not sleeping at all and this is been going on for years.  We have tried trazodone Restoril Lunesta Ambien and Belsomra.  I am going to try clonazepam at bedtime.  However she states that she has had this problem since age 32.  I was wondering if you could help arrange a sleep study for her as the sleep deprivation is worsening both her depression and possibly her seizure disorder.  Thanks for your help in this matter  Loretta Rudereborah Ross MD, psychiatry
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
## 2017-12-10 NOTE — Telephone Encounter (Signed)
I have put in order for sleep refer.

## 2017-12-15 ENCOUNTER — Encounter (HOSPITAL_COMMUNITY): Payer: Self-pay | Admitting: Licensed Clinical Social Worker

## 2017-12-15 ENCOUNTER — Ambulatory Visit (INDEPENDENT_AMBULATORY_CARE_PROVIDER_SITE_OTHER): Payer: Medicare Other | Admitting: Licensed Clinical Social Worker

## 2017-12-15 ENCOUNTER — Encounter: Payer: Self-pay | Admitting: Women's Health

## 2017-12-15 DIAGNOSIS — F331 Major depressive disorder, recurrent, moderate: Secondary | ICD-10-CM

## 2017-12-15 NOTE — Progress Notes (Signed)
   THERAPIST PROGRESS NOTE  Session Time: 2:00 pm-2:45 pm  Participation Level: Active  Behavioral Response: CasualAlertDepressed  Type of Therapy: Individual Therapy  Treatment Goals addressed: Coping  Interventions: CBT and Solution Focused  Summary: Loretta EssexMelonie Padilla is a 32 y.o. female who presents oriented x5 (person, place, situation, time and object), alert but distracted, depressed, average height, average weight, appropriately groomed, casually dressed, and cooperative to address depression. Patient has a history of medical treatment including seizures and mental health treatment including outpatient therapy and medication management. Patient denies symptoms of mania. She denies suicidal and homicidal ideations. Patient denies psychosis including auditory and visual hallucinations. Patient admits to previous alcohol abuse but denies current abuse. Patient is at low risk for lethality at this time.   Physically: Patient is tired.   Spiritually/values: No issues identified.  Relationships: Patient reported that she is trying to work on having a relationship with her father. Her father has never been a part of her life. She said that he wouldn't meet with her until June because he is too busy.  Emotional/Mental/Behavior: Patient reported that she has been feeling down about the lack of relationship she has with her father. She feels like no one loves her and her fiance will leave her. After discussion, patient understood that she needs to challenge those thoughts and understood that it can influence how she interacts with others.   Patient engaged in session. Patient responded well to interventions. She continues to meet criteria for Moderate episode of recurrent major depressive disorder. Patient will continue in outpatient therapy due to being the least restrictive service to meet her needs. Patient made minimal progress on her goals.    Suicidal/Homicidal: Negativewithout  intent/plan  Therapist Response: Therapist reviewed patient's recent thoughts and behaviors. Therapist utilized CBT to address mood. Therapist processed patient's feelings to identify triggers for mood. Therapist discussed patient's relationship with her father and her desire to feel loved.   Plan: Return again in 2-3 weeks.   Diagnosis: Axis I: Moderate episode of recurrent major depressive disorder    Axis II: No diagnosis    Bynum BellowsJoshua Adriyana Greenbaum, LCSW 12/15/2017

## 2017-12-29 ENCOUNTER — Ambulatory Visit (HOSPITAL_COMMUNITY): Payer: Self-pay | Admitting: Licensed Clinical Social Worker

## 2018-01-09 ENCOUNTER — Encounter: Payer: Self-pay | Admitting: Women's Health

## 2018-01-11 ENCOUNTER — Ambulatory Visit (INDEPENDENT_AMBULATORY_CARE_PROVIDER_SITE_OTHER): Payer: Medicare Other | Admitting: Psychiatry

## 2018-01-11 ENCOUNTER — Encounter (HOSPITAL_COMMUNITY): Payer: Self-pay | Admitting: Psychiatry

## 2018-01-11 VITALS — BP 107/70 | HR 90 | Ht 65.0 in | Wt 131.0 lb

## 2018-01-11 DIAGNOSIS — R51 Headache: Secondary | ICD-10-CM

## 2018-01-11 DIAGNOSIS — R569 Unspecified convulsions: Secondary | ICD-10-CM | POA: Diagnosis not present

## 2018-01-11 DIAGNOSIS — Z62810 Personal history of physical and sexual abuse in childhood: Secondary | ICD-10-CM | POA: Diagnosis not present

## 2018-01-11 DIAGNOSIS — Z736 Limitation of activities due to disability: Secondary | ICD-10-CM

## 2018-01-11 DIAGNOSIS — F331 Major depressive disorder, recurrent, moderate: Secondary | ICD-10-CM | POA: Diagnosis not present

## 2018-01-11 DIAGNOSIS — Z818 Family history of other mental and behavioral disorders: Secondary | ICD-10-CM

## 2018-01-11 DIAGNOSIS — F419 Anxiety disorder, unspecified: Secondary | ICD-10-CM

## 2018-01-11 DIAGNOSIS — R450 Nervousness: Secondary | ICD-10-CM

## 2018-01-11 DIAGNOSIS — Z811 Family history of alcohol abuse and dependence: Secondary | ICD-10-CM

## 2018-01-11 DIAGNOSIS — Z87891 Personal history of nicotine dependence: Secondary | ICD-10-CM | POA: Diagnosis not present

## 2018-01-11 DIAGNOSIS — Z91410 Personal history of adult physical and sexual abuse: Secondary | ICD-10-CM

## 2018-01-11 MED ORDER — FLUOXETINE HCL 20 MG PO CAPS: ORAL_CAPSULE | ORAL | 2 refills | Status: DC

## 2018-01-11 MED ORDER — CLONAZEPAM 2 MG PO TABS: 2.0000 mg | ORAL_TABLET | Freq: Every day | ORAL | 2 refills | Status: DC

## 2018-01-11 NOTE — Progress Notes (Signed)
53
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
## 2018-01-13 ENCOUNTER — Encounter (HOSPITAL_COMMUNITY): Payer: Self-pay | Admitting: Licensed Clinical Social Worker

## 2018-01-13 ENCOUNTER — Ambulatory Visit (INDEPENDENT_AMBULATORY_CARE_PROVIDER_SITE_OTHER): Payer: Medicare Other | Admitting: Licensed Clinical Social Worker

## 2018-01-13 DIAGNOSIS — F331 Major depressive disorder, recurrent, moderate: Secondary | ICD-10-CM

## 2018-01-13 DIAGNOSIS — Z0271 Encounter for disability determination: Secondary | ICD-10-CM

## 2018-01-13 NOTE — Progress Notes (Signed)
   THERAPIST PROGRESS NOTE  Session Time: 9:00 am-9:45 am  Participation Level: Active  Behavioral Response: CasualAlertDepressed  Type of Therapy: Individual Therapy  Treatment Goals addressed: Coping  Interventions: CBT and Solution Focused  Summary: Loretta Padilla is a 32 y.o. female who presents oriented x5 (person, place, situation, time and object), alert but distracted, depressed, average height, average weight, appropriately groomed, casually dressed, and cooperative to address depression. Patient has a history of medical treatment including seizures and mental health treatment including outpatient therapy and medication management. Patient denies symptoms of mania. She denies suicidal and homicidal ideations. Patient denies psychosis including auditory and visual hallucinations. Patient admits to previous alcohol abuse but denies current abuse. Patient is at low risk for lethality at this time.   Physically: Patient is struggling with allergies. She wants to quit smoking.  Spiritually/values: No issues identified.  Relationships: Patient is getting along with her boyfriend. She has reconnected with her Uncle and is planning on seeing her grandparents next whom she has not seen in years.  Emotional/Mental/Behavior: Patient has some anxiety over her boys leaving for the summer. After discussion, patient understood that she needs to challenge her anxious thoughts and examine what is possibly and what is actually likely related to anxious thoughts.   Patient engaged in session. Patient responded well to interventions. She continues to meet criteria for Moderate episode of recurrent major depressive disorder. Patient will continue in outpatient therapy due to being the least restrictive service to meet her needs. Patient made minimal progress on her goals.    Suicidal/Homicidal: Negativewithout intent/plan  Therapist Response: Therapist reviewed patient's recent thoughts and  behaviors. Therapist utilized CBT to address mood. Therapist processed patient's feelings to identify triggers for mood. Therapist discussed ways to challenge her anxious thoughts.   Plan: Return again in 2-3 weeks.   Diagnosis: Axis I: Moderate episode of recurrent major depressive disorder    Axis II: No diagnosis    Bynum Bellows, LCSW 01/13/2018

## 2018-01-27 ENCOUNTER — Encounter (HOSPITAL_COMMUNITY): Payer: Self-pay | Admitting: Licensed Clinical Social Worker

## 2018-01-27 ENCOUNTER — Ambulatory Visit (INDEPENDENT_AMBULATORY_CARE_PROVIDER_SITE_OTHER): Payer: Medicare Other | Admitting: Licensed Clinical Social Worker

## 2018-01-27 DIAGNOSIS — F331 Major depressive disorder, recurrent, moderate: Secondary | ICD-10-CM | POA: Diagnosis not present

## 2018-01-27 NOTE — Progress Notes (Signed)
   THERAPIST PROGRESS NOTE  Session Time: 10:00 am-10:45 am  Participation Level: Active  Behavioral Response: CasualAlertDepressed  Type of Therapy: Individual Therapy  Treatment Goals addressed: Coping  Interventions: CBT and Solution Focused  Summary: Loretta Padilla is a 32 y.o. female who presents oriented x5 (person, place, situation, time and object), alert but distracted, depressed, average height, average weight, appropriately groomed, casually dressed, and cooperative to address depression. Patient has a history of medical treatment including seizures and mental health treatment including outpatient therapy and medication management. Patient denies symptoms of mania. She denies suicidal and homicidal ideations. Patient denies psychosis including auditory and visual hallucinations. Patient admits to previous alcohol abuse but denies current abuse. Patient is at low risk for lethality at this time.   Physically: Patient continues to struggle with allergies. She is having trouble with sleep. She has no libido. Patient is trying to get pregnant with her boyfriend but has not been able to. She is going to her OBGYN to get checked and is going to get checked for her allergies. She also plans to start going to the gym to take care of her physical health.  Spiritually/values: Patient reported that she is not living up to her values. She feels like she could be a more attentive parent. She tries to check in with her children but feels like she could do better.   Relationships: Patient's relationship with her boyfriend is going well. She has not had any desire for sex due to the heat and lack of libido. Patient saw her grandparents whom she has not seen in over 20 years. She reported it as awkward.  Emotional/Mental/Behavior: Patient continues to worry about her sons going to see their fathers. Her son's stepmother just had a miscarriage at 40 weeks and is devastated. She spoke to her son about  this and her son didn't have much feeling about the situation because it wasn't "real" for him so he experienced no real loss. Patient worries that when her son goes to see his father it will be all about grieving the baby or all the energy will go into her son.   Patient engaged in session. Patient responded well to interventions. She continues to meet criteria for Moderate episode of recurrent major depressive disorder. Patient will continue in outpatient therapy due to being the least restrictive service to meet her needs. Patient made minimal progress on her goals.    Suicidal/Homicidal: Negativewithout intent/plan  Therapist Response: Therapist reviewed patient's recent thoughts and behaviors. Therapist utilized CBT to address mood. Therapist processed patient's feelings to identify triggers for mood. Therapist had patient identify ways to manage mood.   Plan: Return again in 2-3 weeks.   Diagnosis: Axis I: Moderate episode of recurrent major depressive disorder    Axis II: No diagnosis    Bynum Bellows, LCSW 01/27/2018

## 2018-02-01 ENCOUNTER — Encounter: Payer: Self-pay | Admitting: Neurology

## 2018-02-01 ENCOUNTER — Encounter: Payer: Self-pay | Admitting: Women's Health

## 2018-02-01 ENCOUNTER — Encounter: Payer: Self-pay | Admitting: *Deleted

## 2018-02-01 ENCOUNTER — Telehealth: Payer: Self-pay | Admitting: *Deleted

## 2018-02-01 NOTE — Telephone Encounter (Signed)
-----   Message from Mychart, Generic sent at 02/01/2018 12:11 PM EDT -----    I just found out i am pregnant. Is the meds i am currently on safe to continue on or do we need to make adjustments?

## 2018-02-01 NOTE — Telephone Encounter (Signed)
Dr. Terrace Arabia has reviewed patient's chart.  She has a pending appt on 02/10/18 that she needs to keep.  She should continue her seizure medications, as prescribed, for now.  She should make sure to take her prenatal vitamins and also add folic acid,  daily.  I attempted to reach patient by phone but was unsuccessful.  Her message stated to text or contact through Facebook.  Since we are unable to do either, I have emailed her this information through [employer].

## 2018-02-02 DIAGNOSIS — F339 Major depressive disorder, recurrent, unspecified: Secondary | ICD-10-CM | POA: Diagnosis not present

## 2018-02-02 DIAGNOSIS — J309 Allergic rhinitis, unspecified: Secondary | ICD-10-CM | POA: Diagnosis not present

## 2018-02-02 DIAGNOSIS — G40802 Other epilepsy, not intractable, without status epilepticus: Secondary | ICD-10-CM | POA: Diagnosis not present

## 2018-02-07 ENCOUNTER — Other Ambulatory Visit: Payer: Medicare Other | Admitting: Women's Health

## 2018-02-07 NOTE — Progress Notes (Addendum)
[facility]  PATIENT: Loretta Padilla DOB: 03-Feb-1986   REASON FOR VISIT: Follow-up for seizure disorder HISTORY FROM: Patient    HISTORY OF PRESENT ILLNESS: Loretta Padilla is a 32 year old female, accompanied by her boyfriend [name], seen in refer by her primary care doctor  Avon Gully, for evaluation of seizure, initial evaluation was October 8th 2018.  I reviewed and summarized the referring note, she had a past medical history of depression, epilepsy, is taking Topamax 100 mg, 2 tablets twice a day, She recently moved from [place] to [place] in June 2018,  She was previously under the care of local neurologist Dr.Richard Sharee Holster, MD (Phone: [phone]; Fax:  [phone]; Locations [facility]- [phone] [address] [employer] [id number], [address] [place], [place] [address]).  She reported a history of epilepsy since 32 years old, she used to have generalized tonic-clonic seizure, tried different medications, has been on stable dose of Topamax 100 mg 2 tablets twice a day for many years, but because of frequent recurrent seizures, eventually had VNS placement in December 2014, she reported mild improvement with the VNS, post surgical one year, she has not had generalized seizure, but began to have recurrent smaller spells, staring into the space, unresponsive for few minutes, she began to have increased spells again 2017, now having a spell on a monthly basis, rarely generalized tonic-clonic seizures,  Recent adjustment for her VNS was in June 2018, she noticed wearing off every 5 minutes, with mild left neck pain,   I reviewed the laboratory evaluation in September 2018: Normal CMP, CBC, hemoglobin of 13.2  UPDATE Nov 04 2017:YY She is now taking topamax 100mg  2 tab bid, onfi 20mg  qhs since Oct 2018,  She was having "silenct seizure' when she runs out of her Topamax 400 mg daily, she has dizziness, lightheadedness, staring off  into space, confused, lasting for few second, no GTC events.  Those small seizure-like spells can happen multiple times a day.  Add on onfi 20 mg every night has made a difference, she no longer has recurrent generalized tonic-clonic seizure, before that, she was having it on a monthly basis, She is also taking Prozac for depression, and other simple medication from her psychiatrist office, complains of chronic insomnia, Today we also performed a VNS interrogation, adjustment, she complains of could not tolerate magnet stimulation  Update June 4th 2019:  She has 13, 10, 5, she was taking on topamax while 32 years old has GI issue, 13, health, 5 years ferile seizure.  Last peroid was on May 1, 2nd.  Before that was March 24, 25, 26th.  Home preganncy test.  She ahs not has grandmal seizure, for a long times,  She did nto notice   Irregular on May 2019. Folic acid, prenatal vitamin,   UPDATE 02/08/2018 Ms.Loretta Padilla, 32 year old female returns for follow-up with history of seizure disorder since the age of 1.  She has not had any seizures since last seen however she reports today she is pregnant..  She is currently on Onfi and topiramate.  She has a vagal nerve stimulator that was interrogated at her last visit.  She has not seen her OB yet.  She has 3 other children and was on topiramate with those pregnancies without problems.  She also has a history of depression and is on Prozac.  She returns for reevaluation to discuss her options with pregnancy and medications.  Dizziness, light headaches, stanidon gup.   She astill has staring spells, 2/  month, no LOC, she is at home, she still drive, last spell was at end of May 2019, watching TV, forgetting what shs ie swtaching, what is going on.     REVIEW OF SYSTEMS: Full 14 system review of systems performed and notable only for those listed, all others are neg:  Constitutional: Appetite change Cardiovascular: neg Ear/Nose/Throat: neg  Skin:  neg Eyes: neg Respiratory: Wheezing cough Gastroitestinal: neg  Hematology/Lymphatic: neg  Endocrine: Excessive thirst Musculoskeletal: Back pain Allergy/Immunology: Environmental allergies Neurological: Seizure disorder, headache dizziness Psychiatric: neg Sleep : neg   ALLERGIES: Allergies  Allergen Reactions  . Adhesive [Tape]     Birth control patch and nicoderm patch   . Carbatrol [Carbamazepine] Rash  . Dilantin [Phenytoin Sodium Extended] Rash  . Tizanidine Hcl Rash  . Venlafaxine Rash    HOME MEDICATIONS: Outpatient Medications Prior to Visit  Medication Sig Dispense Refill  . Acetaminophen (TYLENOL PO) Take by mouth as needed.    . cloBAZam (ONFI) 20 MG tablet Take 1 tablet (20 mg total) by mouth 2 (two) times daily. 60 tablet 5  . clonazePAM (KLONOPIN) 2 MG tablet Take 1 tablet (2 mg total) by mouth at bedtime. 30 tablet 2  . FLUoxetine (PROZAC) 20 MG capsule Take three capsules daily 90 capsule 2  . folic acid (FOLVITE) 1 MG tablet Take 1 mg by mouth daily.    . Topiramate ER (TROKENDI XR) 200 MG CP24 Take 400 mg by mouth at bedtime. 60 capsule 11  . Ascorbic Acid (VITAMIN C PO) Take by mouth daily.     No facility-administered medications prior to visit.     PAST MEDICAL HISTORY: Past Medical History:  Diagnosis Date  . Anxiety   . Back pain   . Depression   . Kidney stones   . Seizures (HCC)     PAST SURGICAL HISTORY: Past Surgical History:  Procedure Laterality Date  . IMPLANTATION VAGAL NERVE STIMULATOR    . VSD REPAIR      FAMILY HISTORY: Family History  Problem Relation Age of Onset  . Bipolar disorder Mother   . Alcohol abuse Mother   . Depression Sister   . Anxiety disorder Sister   . Alcohol abuse Maternal Grandfather   . Alcohol abuse Paternal Grandfather   . Other Father        unsure of history    SOCIAL HISTORY: Social History   Socioeconomic History  . Marital status: Divorced    Spouse name: Not on file  . Number of  children: 3  . Years of education: 3 years college  . Highest education level: Not on file  Occupational History  . Occupation: Disabled  Social Needs  . Financial resource strain: Not on file  . Food insecurity:    Worry: Not on file    Inability: Not on file  . Transportation needs:    Medical: Not on file    Non-medical: Not on file  Tobacco Use  . Smoking status: Former Smoker    Packs/day: 1.00  . Smokeless tobacco: Never Used  . Tobacco comment: 05-07-2017 per pt she stopped 3 wks from this date  Substance and Sexual Activity  . Alcohol use: Yes    Comment: occ., 05-07-2017 per pt 2-3 times a mth  . Drug use: No    Comment: 05-07-2017 - Stopped Marijuana in 11-2016  . Sexual activity: Yes    Birth control/protection: None  Lifestyle  . Physical activity:    Days per week: Not  on file    Minutes per session: Not on file  . Stress: Not on file  Relationships  . Social connections:    Talks on phone: Not on file    Gets together: Not on file    Attends religious service: Not on file    Active member of club or organization: Not on file    Attends meetings of clubs or organizations: Not on file    Relationship status: Not on file  . Intimate partner violence:    Fear of current or ex partner: Not on file    Emotionally abused: Not on file    Physically abused: Not on file    Forced sexual activity: Not on file  Other Topics Concern  . Not on file  Social History Narrative   Lives at home with boyfriend and children.   Right-handed.   Occasional use of caffeine.        PHYSICAL EXAM  Vitals:   02/08/18 1339  BP: 94/60  Pulse: 82  Weight: 137 lb 9.6 oz (62.4 kg)  Height: 5\' 5"  (1.651 m)   Body mass index is 22.9 kg/m.  Generalized: Well developed, in no acute distress  Head: normocephalic and atraumatic,. Oropharynx benign  Neck: Supple,  Musculoskeletal: No deformity   Neurological examination   Mentation: Alert oriented to time, place, history  taking. Attention span and concentration appropriate. Recent and remote memory intact.  Follows all commands speech and language fluent.   Cranial nerve II-XII: Pupils were equal round reactive to light extraocular movements were full, visual field were full on confrontational test. Facial sensation and strength were normal. hearing was intact to finger rubbing bilaterally. Uvula tongue midline. head turning and shoulder shrug were normal and symmetric.Tongue protrusion into cheek strength was normal. Motor: normal bulk and tone, full strength in the BUE, BLE,  Sensory: normal and symmetric to light touch,  Coordination: finger-nose-finger, heel-to-shin bilaterally, no dysmetria Reflexes: Symmetric upper and lower, plantar responses were flexor bilaterally. Gait and Station: Rising up from seated position without assistance, normal stance,  moderate stride, good arm swing, smooth turning, able to perform tiptoe, and heel walking without difficulty. Tandem gait is steady  DIAGNOSTIC DATA (LABS, IMAGING, TESTING) - I reviewed patient records, labs, notes, testing and imaging myself where available.  Lab Results  Component Value Date   WBC 6.6 04/04/2017   HGB 12.2 05/14/2017   HCT 36.0 05/14/2017   MCV 90.0 04/04/2017   PLT 224 04/04/2017      Component Value Date/Time   NA 140 05/14/2017 1821   K 3.8 05/14/2017 1821   CL 107 05/14/2017 1821   CO2 20 (L) 04/04/2017 2300   GLUCOSE 92 05/14/2017 1821   BUN 18 05/14/2017 1821   CREATININE 0.80 05/14/2017 1821   CALCIUM 9.2 04/04/2017 2300   PROT 6.6 04/04/2017 2300   ALBUMIN 4.2 04/04/2017 2300   AST 13 (L) 04/04/2017 2300   ALT 11 (L) 04/04/2017 2300   ALKPHOS 46 04/04/2017 2300   BILITOT 0.9 04/04/2017 2300   GFRNONAA >60 04/04/2017 2300   GFRAA >60 04/04/2017 2300    ASSESSMENT AND PLAN Irasema Qualley is a 32 y.o. female   with a history of epilepsy currently on topiramate extended release 200 mg 2 tabs at bedtime.  She is  also on Onfi 20 mg twice daily.  Patient is pregnant and wants to discuss her options with medications.  She was made aware that Onfi is relatively nameand discussing some of  the inflammation in terms of pregnancy with the patient such as a half of the placenta, no well controlled studies in pregnant women, drug use during pregnancy and congenital abnormalities such as cleft lip or palate.  Patient states she will stop the drug on her own if it is going to harm her baby.  She decided to have her VNS interrogated instead and be titrated off of the Omni.  Discussed with Dr. Terrace ArabiaYan who will do this later today Nilda RiggsNancy Carolyn Arthur Speagle, [facility] LLCGNP, [facility] IncBC, APRN  [facility] [address], [address] [place], [place] [address] [phone]  Addendum: I saw patient following nurse practitioner visit with Eber JonesCarolyn, went over potential teratogenic side effect of Onfi and Topamax, she had normal pregnancy with Topamax previously, she had recurrent spells of staring, transient confusion, but there was no generalized clonic seizure in the past few years, we decided to stop onfil 20mg  every night to avoid the potential side effect,  I also offered option to titrating up vagal nerve stimulation, reviewed literature, which was proven to be safe to fetus, patient refused,  We will keep her on monotherapy Topamax xr 200 mg 2 tablets every night, check Topamax level, benzodiazepine level,  She is to document all the event, I also emphasized importance of take prenatal vitamin, and folic acid supplement.

## 2018-02-08 ENCOUNTER — Other Ambulatory Visit: Payer: Self-pay | Admitting: *Deleted

## 2018-02-08 ENCOUNTER — Ambulatory Visit (INDEPENDENT_AMBULATORY_CARE_PROVIDER_SITE_OTHER): Payer: Medicare Other | Admitting: Nurse Practitioner

## 2018-02-08 ENCOUNTER — Encounter: Payer: Self-pay | Admitting: Nurse Practitioner

## 2018-02-08 VITALS — BP 94/60 | HR 82 | Ht 65.0 in | Wt 137.6 lb

## 2018-02-08 DIAGNOSIS — R799 Abnormal finding of blood chemistry, unspecified: Secondary | ICD-10-CM | POA: Diagnosis not present

## 2018-02-08 DIAGNOSIS — Z79899 Other long term (current) drug therapy: Secondary | ICD-10-CM | POA: Diagnosis not present

## 2018-02-08 DIAGNOSIS — R569 Unspecified convulsions: Secondary | ICD-10-CM | POA: Diagnosis not present

## 2018-02-08 DIAGNOSIS — G40219 Localization-related (focal) (partial) symptomatic epilepsy and epileptic syndromes with complex partial seizures, intractable, without status epilepticus: Secondary | ICD-10-CM

## 2018-02-08 NOTE — Addendum Note (Signed)
Addended by: Levert FeinsteinYAN, Charm Stenner on: 02/08/2018 04:13 PM   Modules accepted: Orders

## 2018-02-08 NOTE — Patient Instructions (Signed)
Per Dr. Terrace ArabiaYan

## 2018-02-09 ENCOUNTER — Other Ambulatory Visit: Payer: Self-pay | Admitting: *Deleted

## 2018-02-09 ENCOUNTER — Ambulatory Visit (HOSPITAL_COMMUNITY): Payer: Medicare Other | Admitting: Licensed Clinical Social Worker

## 2018-02-09 DIAGNOSIS — Z79899 Other long term (current) drug therapy: Secondary | ICD-10-CM

## 2018-02-09 LAB — TOPIRAMATE LEVEL: Topiramate Lvl: 8.5 ug/mL (ref 2.0–25.0)

## 2018-02-10 ENCOUNTER — Ambulatory Visit (INDEPENDENT_AMBULATORY_CARE_PROVIDER_SITE_OTHER): Payer: Medicare Other | Admitting: Advanced Practice Midwife

## 2018-02-10 ENCOUNTER — Encounter: Payer: Self-pay | Admitting: Advanced Practice Midwife

## 2018-02-10 ENCOUNTER — Other Ambulatory Visit: Payer: Self-pay

## 2018-02-10 ENCOUNTER — Ambulatory Visit: Payer: Medicare Other | Admitting: Neurology

## 2018-02-10 VITALS — BP 114/54 | HR 83 | Ht 64.0 in | Wt 137.0 lb

## 2018-02-10 DIAGNOSIS — Z3201 Encounter for pregnancy test, result positive: Secondary | ICD-10-CM

## 2018-02-10 LAB — POCT URINE PREGNANCY: Preg Test, Ur: POSITIVE — AB

## 2018-02-10 NOTE — Progress Notes (Signed)
Family [facility] Visit  Patient name: Loretta Padilla MRN [id number]  Date of birth: [birth year]  CC & HPI:  Rexine Gowens is a 32 y.o. Caucasian female presenting today for pregnancy confirmation. Had a 1.5 day LMP 5/1.  Has seizures often, saw neuro 6/4 (see note).has tried lamictal and keppra w/o good results. Pt has decided to continue Topomax d/t being on it w/her other pg, but stop Omni d/t it being a newer drug w/uncertain pregnancy category.  Takes 2mg  klonopin for sleep  Advised that these are both Cat D drugs and run risk of cleft lip/palate.  Already on PNV w/ folic acid.  Advised not to abruptly stop klonopin as seizure threshold could be lowered, but pt wants to wean off and replace w/ambien for sleep.  I support this plan.    Pertinent History Reviewed:  Medical & Surgical Hx:   Past Medical History:  Diagnosis Date  . Anxiety   . Back pain   . Depression   . Kidney stones   . Seizures (HCC)    Past Surgical History:  Procedure Laterality Date  . IMPLANTATION VAGAL NERVE STIMULATOR    . VSD REPAIR     Family History  Problem Relation Age of Onset  . Bipolar disorder Mother   . Alcohol abuse Mother   . Depression Sister   . Anxiety disorder Sister   . Alcohol abuse Maternal Grandfather   . Alcohol abuse Paternal Grandfather   . Other Father        unsure of history    Current Outpatient Medications:  .  Acetaminophen (TYLENOL PO), Take by mouth as needed., Disp: , Rfl:  .  clonazePAM (KLONOPIN) 2 MG tablet, Take 1 tablet (2 mg total) by mouth at bedtime., Disp: 30 tablet, Rfl: 2 .  oxymetazoline (AFRIN) 0.05 % nasal spray, Place 1 spray into both nostrils 2 (two) times daily., Disp: , Rfl:  .  Prenatal Vit-Fe Fumarate-FA (MULTIVITAMIN-PRENATAL) 27-0.8 MG TABS tablet, Take 1 tablet by mouth daily at 12 noon., Disp: , Rfl:  .  Topiramate ER (TROKENDI XR) 200 MG CP24, Take 400 mg by mouth at bedtime., Disp: 60 capsule, Rfl: 11 .  cloBAZam (ONFI) 20 MG tablet,  Take 1 tablet (20 mg total) by mouth 2 (two) times daily. (Patient not taking: Reported on 02/10/2018), Disp: 60 tablet, Rfl: 5 .  FLUoxetine (PROZAC) 20 MG capsule, Take three capsules daily (Patient not taking: Reported on 02/10/2018), Disp: 90 capsule, Rfl: 2 .  folic acid (FOLVITE) 1 MG tablet, Take 1 mg by mouth daily., Disp: , Rfl:  Social History: Reviewed -  reports that she has been smoking.  She has been smoking about 0.25 packs per day. She has never used smokeless tobacco.  Review of Systems:   Constitutional: Negative for fever and chills Eyes: Negative for visual disturbances Respiratory: Negative for shortness of breath, dyspnea Cardiovascular: Negative for chest pain or palpitations  Gastrointestinal: Negative for vomiting, diarrhea and constipation; no abdominal pain Genitourinary: Negative for dysuria and urgency, vaginal irritation or itching Musculoskeletal: Negative for back pain, joint pain, myalgias  Neurological: Negative for dizziness and headaches    Objective Findings:    Physical Examination: Vitals:   02/10/18 1148  BP: (!) 114/54  Pulse: 83   General appearance - well appearing, and in no distress Mental status - alert, oriented to person, place, and time Chest:  Normal respiratory effort Heart - normal rate and regular rhythm Musculoskeletal:  Normal range of motion  without pain Extremities:  No edema    Results for orders placed or performed in visit on 02/10/18 (from the past 24 hour(s))  POCT urine pregnancy   Collection Time: 02/10/18 11:51 AM  Result Value Ref Range   Preg Test, Ur Positive (A) Negative      Assessment & Plan:  A:   + UPT, 5.1 weeks by LMP P:     Return for 2-3 weeks for dating US.  Jacklyn ShellFrances Cresenzo-Dishmon CNM 02/10/2018 12:16 PM

## 2018-02-11 NOTE — Addendum Note (Signed)
Addended by: Levert FeinsteinYAN, Masao Junker on: 02/11/2018 04:24 PM   Modules accepted: Level of Service

## 2018-02-14 ENCOUNTER — Telehealth (HOSPITAL_COMMUNITY): Payer: Self-pay | Admitting: *Deleted

## 2018-02-14 NOTE — Telephone Encounter (Signed)
Ask her to come in to discuss

## 2018-02-14 NOTE — Telephone Encounter (Signed)
Dr Tenny Crawoss Patient called stating that she just found out that she is pregnant 5 1/2-6 weeks. And that her GYN. prefers her to be on Ambien verses the Klonopin while pregnant

## 2018-02-14 NOTE — Telephone Encounter (Signed)
Spoke with patient asked her per provider : to come in to discuss ( Ambien per ob gyn preference vs  Klonopin) while pregnant

## 2018-02-16 ENCOUNTER — Encounter: Payer: Self-pay | Admitting: Advanced Practice Midwife

## 2018-02-16 ENCOUNTER — Ambulatory Visit (INDEPENDENT_AMBULATORY_CARE_PROVIDER_SITE_OTHER): Payer: Medicare Other | Admitting: Psychiatry

## 2018-02-16 ENCOUNTER — Encounter (HOSPITAL_COMMUNITY): Payer: Self-pay | Admitting: Psychiatry

## 2018-02-16 ENCOUNTER — Other Ambulatory Visit: Payer: Self-pay | Admitting: Advanced Practice Midwife

## 2018-02-16 VITALS — BP 118/80 | HR 89 | Ht 64.0 in | Wt 129.0 lb

## 2018-02-16 DIAGNOSIS — G4700 Insomnia, unspecified: Secondary | ICD-10-CM

## 2018-02-16 DIAGNOSIS — Z736 Limitation of activities due to disability: Secondary | ICD-10-CM | POA: Diagnosis not present

## 2018-02-16 DIAGNOSIS — Z818 Family history of other mental and behavioral disorders: Secondary | ICD-10-CM

## 2018-02-16 DIAGNOSIS — R5383 Other fatigue: Secondary | ICD-10-CM

## 2018-02-16 DIAGNOSIS — F331 Major depressive disorder, recurrent, moderate: Secondary | ICD-10-CM

## 2018-02-16 DIAGNOSIS — Z62810 Personal history of physical and sexual abuse in childhood: Secondary | ICD-10-CM

## 2018-02-16 DIAGNOSIS — R569 Unspecified convulsions: Secondary | ICD-10-CM | POA: Diagnosis not present

## 2018-02-16 DIAGNOSIS — R634 Abnormal weight loss: Secondary | ICD-10-CM

## 2018-02-16 DIAGNOSIS — Z87891 Personal history of nicotine dependence: Secondary | ICD-10-CM | POA: Diagnosis not present

## 2018-02-16 DIAGNOSIS — Z811 Family history of alcohol abuse and dependence: Secondary | ICD-10-CM

## 2018-02-16 MED ORDER — FLUOXETINE HCL 20 MG PO CAPS: 20.0000 mg | ORAL_CAPSULE | Freq: Every day | ORAL | 2 refills | Status: DC

## 2018-02-16 MED ORDER — ZOLPIDEM TARTRATE 10 MG PO TABS: 10.0000 mg | ORAL_TABLET | Freq: Every evening | ORAL | 2 refills | Status: DC | PRN

## 2018-02-16 MED ORDER — HYDROXYZINE HCL 25 MG PO TABS: 25.0000 mg | ORAL_TABLET | Freq: Two times a day (BID) | ORAL | 2 refills | Status: DC | PRN

## 2018-02-16 MED ORDER — DOXYLAMINE-PYRIDOXINE 10-10 MG PO TBEC: DELAYED_RELEASE_TABLET | ORAL | 3 refills | Status: DC

## 2018-02-16 NOTE — Progress Notes (Signed)
diclegis rx
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
## 2018-02-22 ENCOUNTER — Telehealth: Payer: Self-pay | Admitting: Neurology

## 2018-02-22 NOTE — Telephone Encounter (Signed)
I talked with Dr. Mellody DanceKeith, Lanora ManisElizabeth from [facility] to help me interpret her most recent laboratory evaluations,  It was positive for benzodiazepine, and also for clonazepam with level of 18 NG per mL, reference was 22 to 70.  Aminoclonazepam level was 12 NG per mL  This means patient has been taking clonazepam," like a fingerprint"  But there is no test available for onfi (clobazem) or its metabolite N-desmethylclobazem.

## 2018-02-23 ENCOUNTER — Telehealth: Payer: Self-pay | Admitting: Neurology

## 2018-02-23 LAB — SPECIMEN STATUS REPORT

## 2018-02-23 NOTE — Telephone Encounter (Signed)
Spoke to patient - she was on clonazepam prn but it has been discontinued recently due to her pregnancy.  She is now taking hydroxyzine and zolpidem, prescribed by Dr. Diannia Rudereborah Ross.  These medications are updated in her chart.

## 2018-02-23 NOTE — Telephone Encounter (Signed)
Please call patient, lab evaluation showed normal topamax level was 8.5, also double check her medication list, it was also positive for benzodiazepine specifically clonazepam, if needed, we can update our medication list, make sure she has stopped her onfi

## 2018-02-25 ENCOUNTER — Other Ambulatory Visit: Payer: Self-pay | Admitting: Obstetrics and Gynecology

## 2018-02-25 DIAGNOSIS — O3680X0 Pregnancy with inconclusive fetal viability, not applicable or unspecified: Secondary | ICD-10-CM

## 2018-02-28 ENCOUNTER — Ambulatory Visit (INDEPENDENT_AMBULATORY_CARE_PROVIDER_SITE_OTHER): Payer: Medicare Other

## 2018-02-28 DIAGNOSIS — O3680X0 Pregnancy with inconclusive fetal viability, not applicable or unspecified: Secondary | ICD-10-CM

## 2018-02-28 DIAGNOSIS — Z3A08 8 weeks gestation of pregnancy: Secondary | ICD-10-CM | POA: Diagnosis not present

## 2018-02-28 NOTE — Progress Notes (Signed)
US 8 wks,single IUP w/ys,positive fht 166 bpm,crl 16.45 mm,fhr 166 bpm,normal ovaries bilat

## 2018-03-03 LAB — BENZODIAZEPINES CONFIRMATION, QUANTITATIVE, SERUM OR PLASMA
7-Aminoclonazepam: 12 ng/mL
Alprazolam (Xanax): NEGATIVE ng/mL (ref 5–25)
Benzodepines Screen: POSITIVE
Chlordiazepoxide + Desmethylc: NEGATIVE ng/mL (ref 400–3000)
Chlordiazepoxide: NEGATIVE ng/mL
Clonazepam (Klonopin): 18 ng/mL — ABNORMAL LOW (ref 20–70)
Desalkylflurazepam: NEGATIVE ng/mL (ref 30–150)
Desmethylchlordiazepoxide: NEGATIVE ng/mL
Desmethyldiazepam: NEGATIVE ng/mL
Diazepam + Desmethyldiazepam: NEGATIVE ng/mL (ref 200–2500)
Diazepam: NEGATIVE ng/mL
Flurazepam +Desalkylflurazepam: NEGATIVE ng/mL (ref 30–180)
Flurazepam: NEGATIVE ng/mL (ref 0–30)
Lorazepam (Ativan): NEGATIVE ng/mL (ref 50.0–240.0)
Midazolam (Versed): NEGATIVE ng/mL (ref 50–600)
Oxazepam: NEGATIVE ng/mL (ref 200–500)
Temazepam (Restoril): NEGATIVE ng/mL (ref 50–1000)
Triazolam (Halcion): NEGATIVE ng/mL (ref 5.0–20.0)

## 2018-03-03 LAB — SPECIMEN STATUS REPORT

## 2018-03-11 ENCOUNTER — Telehealth: Payer: Self-pay | Admitting: *Deleted

## 2018-03-11 NOTE — Telephone Encounter (Signed)
Patient does not have current medicaid.

## 2018-03-15 ENCOUNTER — Emergency Department (HOSPITAL_COMMUNITY)
Admission: EM | Admit: 2018-03-15 | Discharge: 2018-03-16 | Disposition: A | Discharge status: Home / Self Care | Payer: Medicare Other | Attending: Emergency Medicine | Admitting: Emergency Medicine

## 2018-03-15 ENCOUNTER — Ambulatory Visit (INDEPENDENT_AMBULATORY_CARE_PROVIDER_SITE_OTHER): Payer: Medicaid Other | Admitting: Women's Health

## 2018-03-15 ENCOUNTER — Encounter: Payer: Self-pay | Admitting: Women's Health

## 2018-03-15 ENCOUNTER — Ambulatory Visit: Payer: Medicare Other | Admitting: *Deleted

## 2018-03-15 ENCOUNTER — Encounter (HOSPITAL_COMMUNITY): Payer: Self-pay | Admitting: Emergency Medicine

## 2018-03-15 ENCOUNTER — Other Ambulatory Visit: Payer: Self-pay

## 2018-03-15 VITALS — BP 102/72 | HR 71 | Wt 128.0 lb

## 2018-03-15 DIAGNOSIS — Z3A09 9 weeks gestation of pregnancy: Secondary | ICD-10-CM | POA: Insufficient documentation

## 2018-03-15 DIAGNOSIS — Z1389 Encounter for screening for other disorder: Secondary | ICD-10-CM

## 2018-03-15 DIAGNOSIS — Z3491 Encounter for supervision of normal pregnancy, unspecified, first trimester: Secondary | ICD-10-CM | POA: Diagnosis not present

## 2018-03-15 DIAGNOSIS — O99351 Diseases of the nervous system complicating pregnancy, first trimester: Secondary | ICD-10-CM | POA: Diagnosis not present

## 2018-03-15 DIAGNOSIS — Z79899 Other long term (current) drug therapy: Secondary | ICD-10-CM | POA: Diagnosis not present

## 2018-03-15 DIAGNOSIS — Z87891 Personal history of nicotine dependence: Secondary | ICD-10-CM | POA: Insufficient documentation

## 2018-03-15 DIAGNOSIS — Z3490 Encounter for supervision of normal pregnancy, unspecified, unspecified trimester: Secondary | ICD-10-CM | POA: Insufficient documentation

## 2018-03-15 DIAGNOSIS — R000 Tachycardia, unspecified: Secondary | ICD-10-CM | POA: Diagnosis not present

## 2018-03-15 DIAGNOSIS — G40909 Epilepsy, unspecified, not intractable, without status epilepticus: Secondary | ICD-10-CM | POA: Diagnosis not present

## 2018-03-15 DIAGNOSIS — R569 Unspecified convulsions: Secondary | ICD-10-CM

## 2018-03-15 DIAGNOSIS — O26891 Other specified pregnancy related conditions, first trimester: Secondary | ICD-10-CM | POA: Insufficient documentation

## 2018-03-15 DIAGNOSIS — Z3481 Encounter for supervision of other normal pregnancy, first trimester: Secondary | ICD-10-CM | POA: Diagnosis not present

## 2018-03-15 DIAGNOSIS — R110 Nausea: Secondary | ICD-10-CM | POA: Diagnosis not present

## 2018-03-15 DIAGNOSIS — Z3A10 10 weeks gestation of pregnancy: Secondary | ICD-10-CM | POA: Diagnosis not present

## 2018-03-15 DIAGNOSIS — Z331 Pregnant state, incidental: Secondary | ICD-10-CM

## 2018-03-15 DIAGNOSIS — Z3682 Encounter for antenatal screening for nuchal translucency: Secondary | ICD-10-CM

## 2018-03-15 DIAGNOSIS — G4489 Other headache syndrome: Secondary | ICD-10-CM | POA: Diagnosis not present

## 2018-03-15 DIAGNOSIS — G4700 Insomnia, unspecified: Secondary | ICD-10-CM | POA: Insufficient documentation

## 2018-03-15 LAB — POCT URINALYSIS DIPSTICK
Blood, UA: NEGATIVE
Glucose, UA: NEGATIVE
Ketones, UA: NEGATIVE
Leukocytes, UA: NEGATIVE
Nitrite, UA: NEGATIVE
Protein, UA: NEGATIVE

## 2018-03-15 LAB — BASIC METABOLIC PANEL
Anion gap: 5 (ref 5–15)
BUN: 11 mg/dL (ref 6–20)
CO2: 22 mmol/L (ref 22–32)
Calcium: 8.6 mg/dL — ABNORMAL LOW (ref 8.9–10.3)
Chloride: 106 mmol/L (ref 98–111)
Creatinine, Ser: 0.58 mg/dL (ref 0.44–1.00)
GFR calc Af Amer: >60 mL/min (ref >60)
GFR calc non Af Amer: >60 mL/min (ref >60)
Glucose, Bld: 111 mg/dL — ABNORMAL HIGH (ref 70–99)
Potassium: 3.1 mmol/L — ABNORMAL LOW (ref 3.5–5.1)
Sodium: 133 mmol/L — ABNORMAL LOW (ref 135–145)

## 2018-03-15 LAB — URINALYSIS, ROUTINE W REFLEX MICROSCOPIC
Bilirubin Urine: NEGATIVE
Glucose, UA: 50 mg/dL — AB
Hgb urine dipstick: NEGATIVE
Ketones, ur: 20 mg/dL — AB
Leukocytes, UA: NEGATIVE
Nitrite: NEGATIVE
Protein, ur: 30 mg/dL — AB
Specific Gravity, Urine: 1.021 (ref 1.005–1.030)
pH: 5 (ref 5.0–8.0)

## 2018-03-15 LAB — CBC
HCT: 30.6 % — ABNORMAL LOW (ref 36.0–46.0)
Hemoglobin: 10.5 g/dL — ABNORMAL LOW (ref 12.0–15.0)
MCH: 30.8 pg (ref 26.0–34.0)
MCHC: 34.3 g/dL (ref 30.0–36.0)
MCV: 89.7 fL (ref 78.0–100.0)
Platelets: 179 10*3/uL (ref 150–400)
RBC: 3.41 MIL/uL — ABNORMAL LOW (ref 3.87–5.11)
RDW: 11.6 % (ref 11.5–15.5)
WBC: 5.9 10*3/uL (ref 4.0–10.5)

## 2018-03-15 LAB — CBG MONITORING, ED: Glucose-Capillary: 154 mg/dL — ABNORMAL HIGH (ref 70–99)

## 2018-03-15 MED ORDER — FENTANYL CITRATE (PF) 100 MCG/2ML IJ SOLN
25.0000 ug | Freq: Once | INTRAMUSCULAR | Status: AC
  Administered 2018-03-15: 25 ug via INTRAVENOUS
  Filled 2018-03-15: qty 2

## 2018-03-15 MED ORDER — PROMETHAZINE HCL 25 MG PO TABS: 12.5000 mg | ORAL_TABLET | Freq: Four times a day (QID) | ORAL | 0 refills | Status: DC | PRN

## 2018-03-15 MED ORDER — SODIUM CHLORIDE 0.9 % IV BOLUS
1000.0000 mL | Freq: Once | INTRAVENOUS | Status: AC
  Administered 2018-03-15: 1000 mL via INTRAVENOUS

## 2018-03-15 MED ORDER — ONDANSETRON HCL 4 MG/2ML IJ SOLN
4.0000 mg | Freq: Once | INTRAMUSCULAR | Status: AC
  Administered 2018-03-15: 4 mg via INTRAVENOUS
  Filled 2018-03-15: qty 2

## 2018-03-15 NOTE — ED Triage Notes (Signed)
Pt brought in by [facility]. Pt states she has Hx of seizures but has stopped taking her medications because she is [redacted] weeks pregnant. Pt had witnessed seizure today. Pt C/O nausea and headache.

## 2018-03-15 NOTE — Progress Notes (Signed)
INITIAL OBSTETRICAL VISIT Patient name: Loretta Padilla MRN [id number]  Date of birth: 03/14/1986 Chief Complaint:   Initial Prenatal Visit (nausea)  History of Present Illness:   Loretta Padilla is a 32 y.o. [phone] Caucasian female at 3314w1d by 8wk u/s, with an Estimated Date of Delivery: 10/10/18 being seen today for her initial obstetrical visit.   Her obstetrical history is significant for term uncomplicated SVB x 3, EAB x 2, SAB x 2 Today she reports n/v, has been unable to get diclegis d/t insurance.  H/o epilepsy since 32yo, has Vagus Nerve Stimulator (VNS), currently on Topamax, was also on Onfi prior to pregnancy. Has tried keppra and lamictal previously which did not control seizures. States she currently has 'silent seizures' every few days, has been unable to take topamax d/t n/v, however states she was having these seizures even on meds. [facility], has f/u appt 9/19.  Depression- currently on prozac, vistaril, and ambien at night for insomnia. Managed by Dr. Tenny Crawoss.  Patient's last menstrual period was 12/28/2017 (exact date). Last pap 01/2017. Results were: neg per pt Review of Systems:   Pertinent items are noted in HPI Denies cramping/contractions, leakage of fluid, vaginal bleeding, abnormal vaginal discharge w/ itching/odor/irritation, headaches, visual changes, shortness of breath, chest pain, abdominal pain, severe nausea/vomiting, or problems with urination or bowel movements unless otherwise stated above.  Pertinent History Reviewed:  Reviewed past medical,surgical, social, obstetrical and family history.  Reviewed problem list, medications and allergies. OB History  Gravida Para Term Preterm AB Living  8 3 3   4 3   SAB TAB Ectopic Multiple Live Births  2 2     3     # Outcome Date GA Lbr Len/2nd Weight Sex Delivery Anes PTL Lv  8 Current           7 Term 03/24/12 1861w0d   F Vag-Spont EPI N LIV  6 Term 12/26/07 2223w0d   M Vag-Spont EPI N LIV  5 SAB 2006           4 Term 06/23/04 6936w0d   M Vag-Spont EPI  LIV  3 TAB           2 TAB           1 SAB            Physical Assessment:   Vitals:   03/15/18 1529  BP: 102/72  Pulse: 71  Weight: 128 lb (58.1 kg)  Body mass index is 21.97 kg/m.       Physical Examination:  General appearance - well appearing, and in no distress  Mental status - alert, oriented to person, place, and time  Psych:  She has a normal mood and affect  Skin - warm and dry, normal color, no suspicious lesions noted  Chest - effort normal, all lung fields clear to auscultation bilaterally  Heart - normal rate and regular rhythm  Abdomen - soft, nontender  Extremities:  No swelling or varicosities noted  Thin prep pap is not done   Fetal Heart Rate (bpm): +u/s via informal transabdominal u/s  Results for orders placed or performed in visit on 03/15/18 (from the past 24 hour(s))  POCT urinalysis dipstick   Collection Time: 03/15/18  4:00 PM  Result Value Ref Range   Color, UA     Clarity, UA     Glucose, UA Negative Negative   Bilirubin, UA     Ketones, UA neg    Spec Grav, UA  1.010 - 1.025   Blood, UA neg    pH, UA  5.0 - 8.0   Protein, UA Negative Negative   Urobilinogen, UA  0.2 or 1.0 E.U./dL   Nitrite, UA neg    Leukocytes, UA Negative Negative   Appearance     Odor      Assessment & Plan:  1) Low-Risk Pregnancy [id number] at [redacted]w[redacted]d with an Estimated Date of Delivery: 10/10/18   2) Initial OB visit  3) Epilepsy> on topamax (tried/failed keppra and lamictal in past), has VNS, sees neuro in Sept  4) Depression> on prozac, vistaril  5) Insomnia> on ambien  6) N/V> has been unable to get diclegis d/t insurance, will try phenergan  Meds:  Meds ordered this encounter  Medications  . promethazine (PHENERGAN) 25 MG tablet    Sig: Take 0.5-1 tablets (12.5-25 mg total) by mouth every 6 (six) hours as needed for nausea or vomiting.    Dispense:  30 tablet    Refill:  0    Order Specific Question:    Supervising Provider    Answer:   Lazaro Arms [[id number]]    Initial labs obtained Discussed all meds w/ LHE Continue prenatal vitamins Reviewed n/v relief measures and warning s/s to report Reviewed recommended weight gain based on pre-gravid BMI Encouraged well-balanced diet Genetic Screening discussed Integrated Screen: requested Cystic fibrosis screening discussed declined Ultrasound discussed; fetal survey: requested CCNC completed> spoke w/ Jasmine  Follow-up: Return in about 3 weeks (around 04/05/2018) for LROB, US:NT+1stIT.   Orders Placed This Encounter  Procedures  . GC/Chlamydia Probe Amp  . US Fetal Nuchal Translucency Measurement  . Obstetric Panel, Including HIV  . Urinalysis, Routine w reflex microscopic  . Pain Management Screening Profile (10S)  . POCT urinalysis dipstick    Cheral Marker CNM, [facility] 03/15/2018 5:15 PM

## 2018-03-15 NOTE — Patient Instructions (Signed)
Loretta Padilla, I greatly value your feedback.  If you receive a survey following your visit with us today, we appreciate you taking the time to fill it out.  Thanks, Loretta Padilla, CNM, WHNP-BC   Nausea & Vomiting  Have saltine crackers or pretzels by your bed and eat a few bites before you raise your head out of bed in the morning  Eat small frequent meals throughout the day instead of large meals  Drink plenty of fluids throughout the day to stay hydrated, just don't drink a lot of fluids with your meals.  This can make your stomach fill up faster making you feel sick  Do not brush your teeth right after you eat  Products with real ginger are good for nausea, like ginger ale and ginger hard candy Make sure it says made with real ginger!  Sucking on sour candy like lemon heads is also good for nausea  If your prenatal vitamins make you nauseated, take them at night so you will sleep through the nausea  Sea Bands  If you feel like you need medicine for the nausea & vomiting please let us know  If you are unable to keep any fluids or food down please let us know   Constipation  Drink plenty of fluid, preferably water, throughout the day  Eat foods high in fiber such as fruits, vegetables, and grains  Exercise, such as walking, is a good way to keep your bowels regular  Drink warm fluids, especially warm prune juice, or decaf coffee  Eat a 1/2 cup of real oatmeal (not instant), 1/2 cup applesauce, and 1/2-1 cup warm prune juice every day  If needed, you may take Colace (docusate sodium) stool softener once or twice a day to help keep the stool soft. If you are pregnant, wait until you are out of your first trimester (12-14 weeks of pregnancy)  If you still are having problems with constipation, you may take Miralax once daily as needed to help keep your bowels regular.  If you are pregnant, wait until you are out of your first trimester (12-14 weeks of pregnancy)   First  Trimester of Pregnancy The first trimester of pregnancy is from week 1 until the end of week 12 (months 1 through 3). A week after a sperm fertilizes an egg, the egg will implant on the wall of the uterus. This embryo will begin to develop into a baby. Genes from you and your partner are forming the baby. The female genes determine whether the baby is a boy or a girl. At 6-8 weeks, the eyes and face are formed, and the heartbeat can be seen on ultrasound. At the end of 12 weeks, all the baby's organs are formed.  Now that you are pregnant, you will want to do everything you can to have a healthy baby. Two of the most important things are to get good prenatal care and to follow your health care provider's instructions. Prenatal care is all the medical care you receive before the baby's birth. This care will help prevent, find, and treat any problems during the pregnancy and childbirth. BODY CHANGES Your body goes through many changes during pregnancy. The changes vary from woman to woman.   You may gain or lose a couple of pounds at first.  You may feel sick to your stomach (nauseous) and throw up (vomit). If the vomiting is uncontrollable, call your health care provider.  You may tire easily.  You may develop headaches that  can be relieved by medicines approved by your health care provider.  You may urinate more often. Painful urination may mean you have a bladder infection.  You may develop heartburn as a result of your pregnancy.  You may develop constipation because certain hormones are causing the muscles that push waste through your intestines to slow down.  You may develop hemorrhoids or swollen, bulging veins (varicose veins).  Your breasts may begin to grow larger and become tender. Your nipples may stick out more, and the tissue that surrounds them (areola) may become darker.  Your gums may bleed and may be sensitive to brushing and flossing.  Dark spots or blotches (chloasma, mask  of pregnancy) may develop on your face. This will likely fade after the baby is born.  Your menstrual periods will stop.  You may have a loss of appetite.  You may develop cravings for certain kinds of food.  You may have changes in your emotions from day to day, such as being excited to be pregnant or being concerned that something may go wrong with the pregnancy and baby.  You may have more vivid and strange dreams.  You may have changes in your hair. These can include thickening of your hair, rapid growth, and changes in texture. Some women also have hair loss during or after pregnancy, or hair that feels dry or thin. Your hair will most likely return to normal after your baby is born. WHAT TO EXPECT AT YOUR PRENATAL VISITS During a routine prenatal visit:  You will be weighed to make sure you and the baby are growing normally.  Your blood pressure will be taken.  Your abdomen will be measured to track your baby's growth.  The fetal heartbeat will be listened to starting around week 10 or 12 of your pregnancy.  Test results from any previous visits will be discussed. Your health care provider may ask you:  How you are feeling.  If you are feeling the baby move.  If you have had any abnormal symptoms, such as leaking fluid, bleeding, severe headaches, or abdominal cramping.  If you have any questions. Other tests that may be performed during your first trimester include:  Blood tests to find your blood type and to check for the presence of any previous infections. They will also be used to check for low iron levels (anemia) and Rh antibodies. Later in the pregnancy, blood tests for diabetes will be done along with other tests if problems develop.  Urine tests to check for infections, diabetes, or protein in the urine.  An ultrasound to confirm the proper growth and development of the baby.  An amniocentesis to check for possible genetic problems.  Fetal screens for spina  bifida and Down syndrome.  You may need other tests to make sure you and the baby are doing well. HOME CARE INSTRUCTIONS  Medicines  Follow your health care provider's instructions regarding medicine use. Specific medicines may be either safe or unsafe to take during pregnancy.  Take your prenatal vitamins as directed.  If you develop constipation, try taking a stool softener if your health care provider approves. Diet  Eat regular, well-balanced meals. Choose a variety of foods, such as meat or vegetable-based protein, fish, milk and low-fat dairy products, vegetables, fruits, and whole grain breads and cereals. Your health care provider will help you determine the amount of weight gain that is right for you.  Avoid raw meat and uncooked cheese. These carry germs that can cause  birth defects in the baby.  Eating four or five small meals rather than three large meals a day may help relieve nausea and vomiting. If you start to feel nauseous, eating a few soda crackers can be helpful. Drinking liquids between meals instead of during meals also seems to help nausea and vomiting.  If you develop constipation, eat more high-fiber foods, such as fresh vegetables or fruit and whole grains. Drink enough fluids to keep your urine clear or pale yellow. Activity and Exercise  Exercise only as directed by your health care provider. Exercising will help you:  Control your weight.  Stay in shape.  Be prepared for labor and delivery.  Experiencing pain or cramping in the lower abdomen or low back is a good sign that you should stop exercising. Check with your health care provider before continuing normal exercises.  Try to avoid standing for long periods of time. Move your legs often if you must stand in one place for a long time.  Avoid heavy lifting.  Wear low-heeled shoes, and practice good posture.  You may continue to have sex unless your health care provider directs you  otherwise. Relief of Pain or Discomfort  Wear a good support bra for breast tenderness.   Take warm sitz baths to soothe any pain or discomfort caused by hemorrhoids. Use hemorrhoid cream if your health care provider approves.   Rest with your legs elevated if you have leg cramps or low back pain.  If you develop varicose veins in your legs, wear support hose. Elevate your feet for 15 minutes, 3-4 times a day. Limit salt in your diet. Prenatal Care  Schedule your prenatal visits by the twelfth week of pregnancy. They are usually scheduled monthly at first, then more often in the last 2 months before delivery.  Write down your questions. Take them to your prenatal visits.  Keep all your prenatal visits as directed by your health care provider. Safety  Wear your seat belt at all times when driving.  Make a list of emergency phone numbers, including numbers for family, friends, the hospital, and police and fire departments. General Tips  Ask your health care provider for a referral to a local prenatal education class. Begin classes no later than at the beginning of month 6 of your pregnancy.  Ask for help if you have counseling or nutritional needs during pregnancy. Your health care provider can offer advice or refer you to specialists for help with various needs.  Do not use hot tubs, steam rooms, or saunas.  Do not douche or use tampons or scented sanitary pads.  Do not cross your legs for long periods of time.  Avoid cat litter boxes and soil used by cats. These carry germs that can cause birth defects in the baby and possibly loss of the fetus by miscarriage or stillbirth.  Avoid all smoking, herbs, alcohol, and medicines not prescribed by your health care provider. Chemicals in these affect the formation and growth of the baby.  Schedule a dentist appointment. At home, brush your teeth with a soft toothbrush and be gentle when you floss. SEEK MEDICAL CARE IF:   You have  dizziness.  You have mild pelvic cramps, pelvic pressure, or nagging pain in the abdominal area.  You have persistent nausea, vomiting, or diarrhea.  You have a bad smelling vaginal discharge.  You have pain with urination.  You notice increased swelling in your face, hands, legs, or ankles. SEEK IMMEDIATE MEDICAL CARE IF:  You have a fever.  You are leaking fluid from your vagina.  You have spotting or bleeding from your vagina.  You have severe abdominal cramping or pain.  You have rapid weight gain or loss.  You vomit blood or material that looks like coffee grounds.  You are exposed to [place] measles and have never had them.  You are exposed to fifth disease or chickenpox.  You develop a severe headache.  You have shortness of breath.  You have any kind of trauma, such as from a fall or a car accident. Document Released: 08/18/2001 Document Revised: 01/08/2014 Document Reviewed: 07/04/2013 [facility] Patient Information 2015 [place], [place]. This information is not intended to replace advice given to you by your health care provider. Make sure you discuss any questions you have with your health care provider.

## 2018-03-16 DIAGNOSIS — O26891 Other specified pregnancy related conditions, first trimester: Secondary | ICD-10-CM | POA: Diagnosis not present

## 2018-03-16 LAB — PMP SCREEN PROFILE (10S), URINE
Amphetamine Scrn, Ur: NEGATIVE ng/mL
BARBITURATE SCREEN URINE: NEGATIVE ng/mL
BENZODIAZEPINE SCREEN, URINE: NEGATIVE ng/mL
CANNABINOIDS UR QL SCN: NEGATIVE ng/mL
Cocaine (Metab) Scrn, Ur: NEGATIVE ng/mL
Creatinine(Crt), U: 200.1 mg/dL (ref 20.0–300.0)
Methadone Screen, Urine: NEGATIVE ng/mL
OXYCODONE+OXYMORPHONE UR QL SCN: NEGATIVE ng/mL
Opiate Scrn, Ur: NEGATIVE ng/mL
Ph of Urine: 5.8 (ref 4.5–8.9)
Phencyclidine Qn, Ur: NEGATIVE ng/mL
Propoxyphene Scrn, Ur: NEGATIVE ng/mL

## 2018-03-16 LAB — OBSTETRIC PANEL, INCLUDING HIV
Antibody Screen: NEGATIVE
Basophils Absolute: 0 10*3/uL (ref 0.0–0.2)
Basos: 0 %
EOS (ABSOLUTE): 0.2 10*3/uL (ref 0.0–0.4)
Eos: 4 %
HIV Screen 4th Generation wRfx: NONREACTIVE
Hematocrit: 35.8 % (ref 34.0–46.6)
Hemoglobin: 12.2 g/dL (ref 11.1–15.9)
Hepatitis B Surface Ag: NEGATIVE
Immature Grans (Abs): 0 10*3/uL (ref 0.0–0.1)
Immature Granulocytes: 0 %
Lymphocytes Absolute: 1.5 10*3/uL (ref 0.7–3.1)
Lymphs: 32 %
MCH: 30.6 pg (ref 26.6–33.0)
MCHC: 34.1 g/dL (ref 31.5–35.7)
MCV: 90 fL (ref 79–97)
Monocytes Absolute: 0.3 10*3/uL (ref 0.1–0.9)
Monocytes: 7 %
Neutrophils Absolute: 2.7 10*3/uL (ref 1.4–7.0)
Neutrophils: 57 %
Platelets: 229 10*3/uL (ref 150–450)
RBC: 3.99 x10E6/uL (ref 3.77–5.28)
RDW: 12.9 % (ref 12.3–15.4)
RPR Ser Ql: NONREACTIVE
Rh Factor: POSITIVE
Rubella Antibodies, IGG: 4.24 index (ref >0.99)
WBC: 4.8 10*3/uL (ref 3.4–10.8)

## 2018-03-16 LAB — GC/CHLAMYDIA PROBE AMP
Chlamydia trachomatis, NAA: NEGATIVE
Neisseria gonorrhoeae by PCR: NEGATIVE

## 2018-03-16 LAB — URINALYSIS, ROUTINE W REFLEX MICROSCOPIC
Bilirubin, UA: NEGATIVE
Glucose, UA: NEGATIVE
Ketones, UA: NEGATIVE
Leukocytes, UA: NEGATIVE
Nitrite, UA: NEGATIVE
Protein, UA: NEGATIVE
RBC, UA: NEGATIVE
Specific Gravity, UA: 1.023 (ref 1.005–1.030)
Urobilinogen, Ur: 1 mg/dL (ref 0.2–1.0)
pH, UA: 5.5 (ref 5.0–7.5)

## 2018-03-16 MED ORDER — PROMETHAZINE HCL 25 MG PO TABS: 25.0000 mg | ORAL_TABLET | Freq: Four times a day (QID) | ORAL | 1 refills | Status: DC | PRN

## 2018-03-16 MED ORDER — LORAZEPAM 2 MG/ML IJ SOLN
1.0000 mg | Freq: Once | INTRAMUSCULAR | Status: AC
  Administered 2018-03-16: 1 mg via INTRAVENOUS
  Filled 2018-03-16: qty 1

## 2018-03-16 MED ORDER — TOPIRAMATE 100 MG PO TABS: 100.0000 mg | ORAL_TABLET | Freq: Two times a day (BID) | ORAL | 1 refills | Status: DC

## 2018-03-16 MED ORDER — PROMETHAZINE HCL 25 MG/ML IJ SOLN
12.5000 mg | Freq: Once | INTRAMUSCULAR | Status: AC
  Administered 2018-03-16: 12.5 mg via INTRAVENOUS
  Filled 2018-03-16: qty 1

## 2018-03-16 NOTE — Discharge Instructions (Signed)
I discussed your care with Dr. Emelda FearFerguson.  He recommended follow-up visit tomorrow morning.  Call the office to confirm.  Prescription for Phenergan 25 mg which is a nausea medication and Topamax pills.  Suggest calling the neurologist to confirm dosage pattern.

## 2018-03-16 NOTE — ED Provider Notes (Signed)
[facility] EMERGENCY DEPARTMENT Provider Note   CSN: [id number] Arrival date & time: 03/15/18  2101     History   Chief Complaint Chief Complaint  Patient presents with  . Seizures    HPI Loretta Padilla is a 32 y.o. female.  Level 5 caveat for postictal state.  G4 P3 approximately 9 to [redacted] weeks pregnant presents with generalized seizure just prior to emergency visit.  She has a known history of seizure disorder but has been noncompliant with her medications (Topamax extended release) secondary to nausea.  No prodromal illnesses, fever, sweats, chills, stiff neck, neuro deficits, vaginal bleeding, vaginal discharge.  She is now alert but sleepy.  Severity of symptoms is moderate.  Nothing makes symptoms better or worse.     Past Medical History:  Diagnosis Date  . Anxiety   . Back pain   . Depression   . Kidney stones   . Seizures [facility])     Patient Active Problem List   Diagnosis Date Noted  . Supervision of normal pregnancy 03/15/2018  . Insomnia 03/15/2018  . Epilepsy (HCC) 11/23/2017  . Smoker 08/03/2017  . Depression 05/07/2017    Past Surgical History:  Procedure Laterality Date  . IMPLANTATION VAGAL NERVE STIMULATOR    . VSD REPAIR       OB History    Gravida  8   Para  3   Term  3   Preterm      AB  4   Living  3     SAB  2   TAB  2   Ectopic      Multiple      Live Births  3            Home Medications    Prior to Admission medications   Medication Sig Start Date End Date Taking? Authorizing Provider  FLUoxetine (PROZAC) 20 MG capsule Take 1 capsule (20 mg total) by mouth daily. 02/16/18  Yes Myrlene Broker, MD  hydrOXYzine (ATARAX/VISTARIL) 25 MG tablet Take 1 tablet (25 mg total) by mouth 2 (two) times daily as needed for anxiety or nausea. 02/16/18  Yes Myrlene Broker, MD  oxymetazoline (AFRIN) 0.05 % nasal spray Place 1 spray into both nostrils 2 (two) times daily as needed for congestion.    Yes [provider]   Prenatal Vit-Fe Fumarate-FA (MULTIVITAMIN-PRENATAL) 27-0.8 MG TABS tablet Take 1 tablet by mouth daily at 12 noon.   Yes [provider]  zolpidem (AMBIEN) 10 MG tablet Take 1 tablet (10 mg total) by mouth at bedtime as needed for sleep. 02/16/18 03/18/18 Yes Myrlene Broker, MD  Doxylamine-Pyridoxine 10-10 MG TBEC 2 PO qhs; may take 1po in am and 1po in afternoon prn nausea Patient not taking: Reported on 03/15/2018 02/16/18   Cresenzo-Dishmon, Scarlette Calico, CNM  promethazine (PHENERGAN) 25 MG tablet Take 1 tablet (25 mg total) by mouth every 6 (six) hours as needed. 03/16/18   Donnetta Hutching, MD  topiramate (TOPAMAX) 100 MG tablet Take 1 tablet (100 mg total) by mouth 2 (two) times daily. 03/16/18   Donnetta Hutching, MD    Family History Family History  Problem Relation Age of Onset  . Bipolar disorder Mother   . Alcohol abuse Mother   . Depression Sister   . Anxiety disorder Sister   . Alcohol abuse Maternal Grandfather   . Diabetes Maternal Grandfather   . Heart attack Maternal Grandfather   . Alcohol abuse Paternal Grandfather   . Heart  disease Paternal Grandfather   . Other Father        unsure of history  . Asthma Son   . Other Son        EOE    Social History Social History   Tobacco Use  . Smoking status: Former Smoker    Packs/day: 0.25  . Smokeless tobacco: Never Used  Substance Use Topics  . Alcohol use: Not Currently    Comment: occ., 05-07-2017 per pt 2-3 times a mth  . Drug use: No     Allergies   Adhesive [tape]; Carbatrol [carbamazepine]; Dilantin [phenytoin sodium extended]; Tizanidine hcl; and Venlafaxine   Review of Systems Review of Systems  Unable to perform ROS: Other (Postictal)     Physical Exam Updated Vital Signs BP 99/60   Pulse 76   Temp 98.7 F (37.1 C) (Oral)   Resp (!) 22   Ht 5\' 5"  (1.651 m)   Wt 58.1 kg (128 lb)   LMP 12/28/2017 (Exact Date)   SpO2 100%   BMI 21.30 kg/m   Physical Exam  Constitutional: She is oriented to  person, place, and time.  Alert, no seizure activity in the emergency department.  HENT:  Head: Normocephalic and atraumatic.  Eyes: Conjunctivae are normal.  Neck: Neck supple.  Cardiovascular: Normal rate and regular rhythm.  Pulmonary/Chest: Effort normal and breath sounds normal.  Abdominal: Soft. Bowel sounds are normal.  Musculoskeletal: Normal range of motion.  Neurological: She is alert and oriented to person, place, and time.  Skin: Skin is warm and dry.  Psychiatric: She has a normal mood and affect. Her behavior is normal.  Nursing note and vitals reviewed.    ED Treatments / Results  Labs (all labs ordered are listed, but only abnormal results are displayed) Labs Reviewed  CBC - Abnormal; Notable for the following components:      Result Value   RBC 3.41 (*)    Hemoglobin 10.5 (*)    HCT 30.6 (*)    All other components within normal limits  BASIC METABOLIC PANEL - Abnormal; Notable for the following components:   Sodium 133 (*)    Potassium 3.1 (*)    Glucose, Bld 111 (*)    Calcium 8.6 (*)    All other components within normal limits  URINALYSIS, ROUTINE W REFLEX MICROSCOPIC - Abnormal; Notable for the following components:   APPearance HAZY (*)    Glucose, UA 50 (*)    Ketones, ur 20 (*)    Protein, ur 30 (*)    Bacteria, UA RARE (*)    All other components within normal limits  CBG MONITORING, ED - Abnormal; Notable for the following components:   Glucose-Capillary 154 (*)    All other components within normal limits    EKG EKG Interpretation  Date/Time:  Tuesday March 15 2018 21:05:20 EDT Ventricular Rate:  91 PR Interval:    QRS Duration: 94 QT Interval:  349 QTC Calculation: 430 R Axis:   70 Text Interpretation:  Sinus rhythm No previous ECGs available Confirmed by Molpus, John ([id number]) on 03/16/2018 9:09:11 AM   Radiology No results found.  Procedures Procedures (including critical care time)  Medications Ordered in ED Medications    ondansetron (ZOFRAN) injection 4 mg (4 mg Intravenous Given 03/15/18 2156)  sodium chloride 0.9 % bolus 1,000 mL (0 mLs Intravenous Stopped 03/15/18 2317)  sodium chloride 0.9 % bolus 1,000 mL (0 mLs Intravenous Stopped 03/16/18 0002)  fentaNYL (SUBLIMAZE) injection 25 mcg (25  mcg Intravenous Given 03/15/18 2326)  LORazepam (ATIVAN) injection 1 mg (1 mg Intravenous Given 03/16/18 0014)  promethazine (PHENERGAN) injection 12.5 mg (12.5 mg Intravenous Given 03/16/18 0014)     Initial Impression / Assessment and Plan / ED Course  I have reviewed the triage vital signs and the nursing notes.  Pertinent labs & imaging results that were available during my care of the patient were reviewed by me and considered in my medical decision making (see chart for details).     Pregnant woman presents with known history of seizure disorder presents with a seizure today.  She is now alert.  Laboratory work-up shows no significant anomalies.  She was given 2 L of IV fluid, IV Zofran, IV Ativan, IV Phenergan.  I discussed her case with the obstetrician on-call Dr. Emelda Fear.  He agreed with treatment plan.  He will evaluate on Wednesday.  Discharge medication Topamax tablets and Phenergan 25 mg.  Discussed with patient and her husband.  Final Clinical Impressions(s) / ED Diagnoses   Final diagnoses:  Pregnancy, unspecified gestational age  Seizure [facility])    ED Discharge Orders        Ordered    promethazine (PHENERGAN) 25 MG tablet  Every 6 hours PRN     03/16/18 0011    topiramate (TOPAMAX) 100 MG tablet  2 times daily     03/16/18 0011       Donnetta Hutching, MD 03/16/18 1248

## 2018-03-17 ENCOUNTER — Encounter: Payer: Self-pay | Admitting: Neurology

## 2018-03-18 ENCOUNTER — Encounter: Payer: Self-pay | Admitting: *Deleted

## 2018-03-18 ENCOUNTER — Telehealth: Payer: Self-pay | Admitting: Neurology

## 2018-03-18 MED ORDER — TOPIRAMATE 100 MG PO TABS: 200.0000 mg | ORAL_TABLET | Freq: Two times a day (BID) | ORAL | 4 refills | Status: DC

## 2018-03-18 NOTE — Telephone Encounter (Signed)
I am unable to reach patient by phone.  Dr. Terrace ArabiaYan would like her to take topiramate 100mg , two capsules BID and has sent in a new prescription to the pharmacy.  There was a recent prescription sent in by Dr. Adriana Simasook for topiramate 100mg , one capsule BID.  This has been voided at the pharmacy to prevent confusion.  I have emailed the patient back to give her this update.

## 2018-03-18 NOTE — Addendum Note (Signed)
Addended by: Lindell SparKIRKMAN, Sumie Remsen C on: 03/18/2018 12:36 PM   Modules accepted: Orders

## 2018-03-18 NOTE — Telephone Encounter (Signed)
-----   Message -----  From: Caleen EssexMelonie Gunderson  Sent: 03/17/2018 10:40 AM  To: [facility]  Subject: Non-Urgent Medical Question             ----- Message from Mychart, Generic sent at 03/17/2018 10:40 AM EDT -----    I am wondering if i can switch back to topirimate tablets during my pregnancy? The trodenki xr capsules are to large for me to swallow. I have been unable to take them for a few weeks and as a result i had a seizure 2 nights ago. Thanks for your quick response.  Jennene Milhorn    Please double check the dosage of topamax, per record, should be 16459m 2 tabs bid.

## 2018-03-31 ENCOUNTER — Ambulatory Visit (HOSPITAL_COMMUNITY): Payer: Medicare Other | Admitting: Psychiatry

## 2018-04-04 ENCOUNTER — Encounter: Payer: Self-pay | Admitting: Women's Health

## 2018-04-04 ENCOUNTER — Other Ambulatory Visit: Payer: Self-pay

## 2018-04-04 ENCOUNTER — Ambulatory Visit (INDEPENDENT_AMBULATORY_CARE_PROVIDER_SITE_OTHER): Payer: Medicare Other

## 2018-04-04 ENCOUNTER — Ambulatory Visit (INDEPENDENT_AMBULATORY_CARE_PROVIDER_SITE_OTHER): Payer: Medicare Other | Admitting: Women's Health

## 2018-04-04 VITALS — BP 101/61 | HR 104 | Wt 131.0 lb

## 2018-04-04 DIAGNOSIS — Z3A13 13 weeks gestation of pregnancy: Secondary | ICD-10-CM

## 2018-04-04 DIAGNOSIS — R569 Unspecified convulsions: Secondary | ICD-10-CM

## 2018-04-04 DIAGNOSIS — O2691 Pregnancy related conditions, unspecified, first trimester: Secondary | ICD-10-CM

## 2018-04-04 DIAGNOSIS — Z3682 Encounter for antenatal screening for nuchal translucency: Secondary | ICD-10-CM

## 2018-04-04 DIAGNOSIS — Z3481 Encounter for supervision of other normal pregnancy, first trimester: Secondary | ICD-10-CM

## 2018-04-04 DIAGNOSIS — Z1389 Encounter for screening for other disorder: Secondary | ICD-10-CM

## 2018-04-04 DIAGNOSIS — Z331 Pregnant state, incidental: Secondary | ICD-10-CM

## 2018-04-04 LAB — POCT URINALYSIS DIPSTICK OB
Blood, UA: NEGATIVE
Glucose, UA: NEGATIVE — AB
Ketones, UA: NEGATIVE
Leukocytes, UA: NEGATIVE
Nitrite, UA: NEGATIVE
POC,PROTEIN,UA: NEGATIVE

## 2018-04-04 NOTE — Patient Instructions (Signed)
Loretta Padilla, I greatly value your feedback.  If you receive a survey following your visit with us today, we appreciate you taking the time to fill it out.  Thanks, Joellyn HaffKim Miron Marxen, CNM, WHNP-BC   Second Trimester of Pregnancy The second trimester is from week 14 through week 27 (months 4 through 6). The second trimester is often a time when you feel your best. Your body has adjusted to being pregnant, and you begin to feel better physically. Usually, morning sickness has lessened or quit completely, you may have more energy, and you may have an increase in appetite. The second trimester is also a time when the fetus is growing rapidly. At the end of the sixth month, the fetus is about 9 inches long and weighs about 1 pounds. You will likely begin to feel the baby move (quickening) between 16 and 20 weeks of pregnancy. Body changes during your second trimester Your body continues to go through many changes during your second trimester. The changes vary from woman to woman.  Your weight will continue to increase. You will notice your lower abdomen bulging out.  You may begin to get stretch marks on your hips, abdomen, and breasts.  You may develop headaches that can be relieved by medicines. The medicines should be approved by your health care provider.  You may urinate more often because the fetus is pressing on your bladder.  You may develop or continue to have heartburn as a result of your pregnancy.  You may develop constipation because certain hormones are causing the muscles that push waste through your intestines to slow down.  You may develop hemorrhoids or swollen, bulging veins (varicose veins).  You may have back pain. This is caused by: ? Weight gain. ? Pregnancy hormones that are relaxing the joints in your pelvis. ? A shift in weight and the muscles that support your balance.  Your breasts will continue to grow and they will continue to become tender.  Your gums may bleed  and may be sensitive to brushing and flossing.  Dark spots or blotches (chloasma, mask of pregnancy) may develop on your face. This will likely fade after the baby is born.  A dark line from your belly button to the pubic area (linea nigra) may appear. This will likely fade after the baby is born.  You may have changes in your hair. These can include thickening of your hair, rapid growth, and changes in texture. Some women also have hair loss during or after pregnancy, or hair that feels dry or thin. Your hair will most likely return to normal after your baby is born.  What to expect at prenatal visits During a routine prenatal visit:  You will be weighed to make sure you and the fetus are growing normally.  Your blood pressure will be taken.  Your abdomen will be measured to track your baby's growth.  The fetal heartbeat will be listened to.  Any test results from the previous visit will be discussed.  Your health care provider may ask you:  How you are feeling.  If you are feeling the baby move.  If you have had any abnormal symptoms, such as leaking fluid, bleeding, severe headaches, or abdominal cramping.  If you are using any tobacco products, including cigarettes, chewing tobacco, and electronic cigarettes.  If you have any questions.  Other tests that may be performed during your second trimester include:  Blood tests that check for: ? Low iron levels (anemia). ? High blood  sugar that affects pregnant women (gestational diabetes) between 55 and 28 weeks. ? Rh antibodies. This is to check for a protein on red blood cells (Rh factor).  Urine tests to check for infections, diabetes, or protein in the urine.  An ultrasound to confirm the proper growth and development of the baby.  An amniocentesis to check for possible genetic problems.  Fetal screens for spina bifida and Down syndrome.  HIV (human immunodeficiency virus) testing. Routine prenatal testing includes  screening for HIV, unless you choose not to have this test.  Follow these instructions at home: Medicines  Follow your health care provider's instructions regarding medicine use. Specific medicines may be either safe or unsafe to take during pregnancy.  Take a prenatal vitamin that contains at least 600 micrograms (mcg) of folic acid.  If you develop constipation, try taking a stool softener if your health care provider approves. Eating and drinking  Eat a balanced diet that includes fresh fruits and vegetables, whole grains, good sources of protein such as meat, eggs, or tofu, and low-fat dairy. Your health care provider will help you determine the amount of weight gain that is right for you.  Avoid raw meat and uncooked cheese. These carry germs that can cause birth defects in the baby.  If you have low calcium intake from food, talk to your health care provider about whether you should take a daily calcium supplement.  Limit foods that are high in fat and processed sugars, such as fried and sweet foods.  To prevent constipation: ? Drink enough fluid to keep your urine clear or pale yellow. ? Eat foods that are high in fiber, such as fresh fruits and vegetables, whole grains, and beans. Activity  Exercise only as directed by your health care provider. Most women can continue their usual exercise routine during pregnancy. Try to exercise for 30 minutes at least 5 days a week. Stop exercising if you experience uterine contractions.  Avoid heavy lifting, wear low heel shoes, and practice good posture.  A sexual relationship may be continued unless your health care provider directs you otherwise. Relieving pain and discomfort  Wear a good support bra to prevent discomfort from breast tenderness.  Take warm sitz baths to soothe any pain or discomfort caused by hemorrhoids. Use hemorrhoid cream if your health care provider approves.  Rest with your legs elevated if you have leg cramps  or low back pain.  If you develop varicose veins, wear support hose. Elevate your feet for 15 minutes, 3-4 times a day. Limit salt in your diet. Prenatal Care  Write down your questions. Take them to your prenatal visits.  Keep all your prenatal visits as told by your health care provider. This is important. Safety  Wear your seat belt at all times when driving.  Make a list of emergency phone numbers, including numbers for family, friends, the hospital, and police and fire departments. General instructions  Ask your health care provider for a referral to a local prenatal education class. Begin classes no later than the beginning of month 6 of your pregnancy.  Ask for help if you have counseling or nutritional needs during pregnancy. Your health care provider can offer advice or refer you to specialists for help with various needs.  Do not use hot tubs, steam rooms, or saunas.  Do not douche or use tampons or scented sanitary pads.  Do not cross your legs for long periods of time.  Avoid cat litter boxes and soil used  by cats. These carry germs that can cause birth defects in the baby and possibly loss of the fetus by miscarriage or stillbirth.  Avoid all smoking, herbs, alcohol, and unprescribed drugs. Chemicals in these products can affect the formation and growth of the baby.  Do not use any products that contain nicotine or tobacco, such as cigarettes and e-cigarettes. If you need help quitting, ask your health care provider.  Visit your dentist if you have not gone yet during your pregnancy. Use a soft toothbrush to brush your teeth and be gentle when you floss. Contact a health care provider if:  You have dizziness.  You have mild pelvic cramps, pelvic pressure, or nagging pain in the abdominal area.  You have persistent nausea, vomiting, or diarrhea.  You have a bad smelling vaginal discharge.  You have pain when you urinate. Get help right away if:  You have a  fever.  You are leaking fluid from your vagina.  You have spotting or bleeding from your vagina.  You have severe abdominal cramping or pain.  You have rapid weight gain or weight loss.  You have shortness of breath with chest pain.  You notice sudden or extreme swelling of your face, hands, ankles, feet, or legs.  You have not felt your baby move in over an hour.  You have severe headaches that do not go away when you take medicine.  You have vision changes. Summary  The second trimester is from week 14 through week 27 (months 4 through 6). It is also a time when the fetus is growing rapidly.  Your body goes through many changes during pregnancy. The changes vary from woman to woman.  Avoid all smoking, herbs, alcohol, and unprescribed drugs. These chemicals affect the formation and growth your baby.  Do not use any tobacco products, such as cigarettes, chewing tobacco, and e-cigarettes. If you need help quitting, ask your health care provider.  Contact your health care provider if you have any questions. Keep all prenatal visits as told by your health care provider. This is important. This information is not intended to replace advice given to you by your health care provider. Make sure you discuss any questions you have with your health care provider. Document Released: 08/18/2001 Document Revised: 01/30/2016 Document Reviewed: 10/25/2012 Elsevier Interactive Patient Education  2017 [employer].

## 2018-04-04 NOTE — Progress Notes (Signed)
US 13 wks,measurements c/w dates,normal ovaries bilat,anterior pl gr 0,crl 71.42 mm,NB present,NT 1.5 mm,fhr 153 bpm

## 2018-04-04 NOTE — Progress Notes (Signed)
LOW-RISK PREGNANCY VISIT Patient name: Loretta Padilla MRN [id number]  Date of birth: 1986-01-14 Chief Complaint:   Routine Prenatal Visit (NT today)  History of Present Illness:   Loretta Padilla is a 32 y.o. [phone] female at [redacted]w[redacted]d with an Estimated Date of Delivery: 10/10/18 being seen today for ongoing management of a low-risk pregnancy.  Today she reports had seizure Friday, has had some spotting ever since. Supposed to be taking topamax 200mg  BID, doesn't think she was taking it, but has been better now since seizure. Has appt w/ neurologist in Sept. Denies vaginal d/c, itching/odor/irritation. Phenergan helps w/ nausea. Has been in bed a lot, when gets up and moves around, gets nauseated. Mom who is present today thinks depression is bad b/c of this, pt very adamant it is not depression. Is taking prozac and reports doing well.   . Vag. Bleeding: Scant.   Marland Kitchen denies leaking of fluid. Review of Systems:   Pertinent items are noted in HPI Denies abnormal vaginal discharge w/ itching/odor/irritation, headaches, visual changes, shortness of breath, chest pain, abdominal pain, severe nausea/vomiting, or problems with urination or bowel movements unless otherwise stated above. Pertinent History Reviewed:  Reviewed past medical,surgical, social, obstetrical and family history.  Reviewed problem list, medications and allergies. Physical Assessment:   Vitals:   04/04/18 1339  BP: 101/61  Pulse: (!) 104  Weight: 131 lb (59.4 kg)  Body mass index is 21.8 kg/m.        Physical Examination:   General appearance: Well appearing, and in no distress  Mental status: Alert, oriented to person, place, and time  Skin: Warm & dry  Cardiovascular: Normal heart rate noted  Respiratory: Normal respiratory effort, no distress  Abdomen: Soft, gravid, nontender  Pelvic: spec exam: cx visually long/closed, scant amt tan nonodorous d/c         Extremities: Edema: None  Fetal Status:          [place] 13  wks,measurements c/w dates,normal ovaries bilat,anterior pl gr 0,crl 71.42 mm,NB present,NT 1.5 mm,fhr 153 bpm  Results for orders placed or performed in visit on 04/04/18 (from the past 24 hour(s))  POC Urinalysis Dipstick OB   Collection Time: 04/04/18  1:40 PM  Result Value Ref Range   Color, UA     Clarity, UA     Glucose, UA Negative (A) (none)   Bilirubin, UA     Ketones, UA neg    Spec Grav, UA  1.010 - 1.025   Blood, UA neg    pH, UA  5.0 - 8.0   POC Protein UA Negative Negative, Trace   Urobilinogen, UA  0.2 or 1.0 E.U./dL   Nitrite, UA neg    Leukocytes, UA Negative Negative   Appearance     Odor      Assessment & Plan:  1) Low-risk pregnancy [id number] at [redacted]w[redacted]d with an Estimated Date of Delivery: 10/10/18   2) Seizures, last on 7/26, wasn't taking topamax, has restarted 200mg  BID, has appt w/ neuro in Sept  3) Resolving spotting> u/s normal, no infection, pelvic rest x 7d   Meds: No orders of the defined types were placed in this encounter.  Labs/procedures today: 1st IT/NT  Plan:  Continue routine obstetrical care   Reviewed: Preterm labor symptoms and general obstetric precautions including but not limited to vaginal bleeding, contractions, leaking of fluid and fetal movement were reviewed in detail with the patient.  All questions were answered  Follow-up: Return in about 3 weeks (around  04/25/2018) for LROB, 2nd IT.  Orders Placed This Encounter  Procedures  . Integrated 1  . POC Urinalysis Dipstick OB   Cheral MarkerKimberly R Heaton Sarin CNM, [facility]NP-BC 04/04/2018 2:02 PM

## 2018-04-06 LAB — INTEGRATED 1
Crown Rump Length: 71.4 mm
Gest. Age on Collection Date: 13.1 weeks
Maternal Age at EDD: 35.1 yr
Nuchal Translucency (NT): 1.5 mm
Number of Fetuses: 1
PAPP-A Value: 1480.9 ng/mL
Weight: 131 [lb_av]

## 2018-04-13 ENCOUNTER — Ambulatory Visit (HOSPITAL_COMMUNITY): Payer: Medicare Other | Admitting: Psychiatry

## 2018-04-25 ENCOUNTER — Encounter: Payer: Self-pay | Admitting: Obstetrics and Gynecology

## 2018-04-25 ENCOUNTER — Ambulatory Visit (INDEPENDENT_AMBULATORY_CARE_PROVIDER_SITE_OTHER): Payer: Medicare Other | Admitting: Obstetrics and Gynecology

## 2018-04-25 VITALS — BP 86/54 | HR 78 | Wt 133.6 lb

## 2018-04-25 DIAGNOSIS — Z3482 Encounter for supervision of other normal pregnancy, second trimester: Secondary | ICD-10-CM

## 2018-04-25 DIAGNOSIS — Z1389 Encounter for screening for other disorder: Secondary | ICD-10-CM

## 2018-04-25 DIAGNOSIS — Z308 Encounter for other contraceptive management: Secondary | ICD-10-CM | POA: Insufficient documentation

## 2018-04-25 DIAGNOSIS — Z331 Pregnant state, incidental: Secondary | ICD-10-CM

## 2018-04-25 DIAGNOSIS — Z1379 Encounter for other screening for genetic and chromosomal anomalies: Secondary | ICD-10-CM | POA: Diagnosis not present

## 2018-04-25 DIAGNOSIS — Z3A16 16 weeks gestation of pregnancy: Secondary | ICD-10-CM

## 2018-04-25 LAB — POCT URINALYSIS DIPSTICK OB
Blood, UA: NEGATIVE
Glucose, UA: NEGATIVE — AB
Ketones, UA: NEGATIVE
Leukocytes, UA: NEGATIVE
Nitrite, UA: NEGATIVE
POC,PROTEIN,UA: NEGATIVE

## 2018-04-25 NOTE — Progress Notes (Signed)
Patient ID: Loretta Padilla, female   DOB: 12/26/1985, 32 y.o.   MRN: [id number]    LOW-RISK PREGNANCY VISIT Patient name: Loretta Padilla MRN [id number]  Date of birth: 12/08/1985 Chief Complaint:   low risk ob (2nd IT)  History of Present Illness:   Loretta Padilla is a 32 y.o. [phone] female at 5361w0d with an Estimated Date of Delivery: 10/10/18 being seen today for ongoing management of a low-risk pregnancy. Loretta Padilla has hx of seizures  and has been taking her seizure medication since last seizure 03/16/18. She doesn't feel baby moving yet around and has vaginal discharge, which is normal for her. She has been getting headaches daily and take tylenol for it. Today she reports backache, headache, nausea, vomiting and hip pain.  .  .  Movement: Absent. denies leaking of fluid other than normal vaginal discharge since patient says she has had since puberty Review of Systems:   Pertinent items are noted in HPI Denies abnormal vaginal discharge w/ itching/odor/irritation, headaches, visual changes, shortness of breath, chest pain, abdominal pain, severe nausea/vomiting, or problems with urination or bowel movements unless otherwise stated above. Pertinent History Reviewed:  Reviewed past medical,surgical, social, obstetrical and family history.  Reviewed problem list, medications and allergies. Physical Assessment:   Vitals:   04/25/18 1407  BP: (!) 86/54  Pulse: 78  Weight: 133 lb 9.6 oz (60.6 kg)  Body mass index is 22.23 kg/m.        Physical Examination:   General appearance: Well appearing, and in no distress  Mental status: Alert, oriented to person, place, and time  Skin: Warm & dry  Cardiovascular: Normal heart rate noted  Respiratory: Normal respiratory effort, no distress  Abdomen: Soft, gravid, nontender  Pelvic: Cervical exam deferred         Extremities: Edema: None  Fetal Status: Fetal Heart Rate (bpm): 134 Fundal Height: 15 cm Movement: Absent    Results for orders  placed or performed in visit on 04/25/18 (from the past 24 hour(s))  POC Urinalysis Dipstick OB   Collection Time: 04/25/18  2:54 PM  Result Value Ref Range   Color, UA     Clarity, UA     Glucose, UA Negative (A) (none)   Bilirubin, UA     Ketones, UA neg    Spec Grav, UA     Blood, UA neg    pH, UA     POC Protein UA Negative Negative, Trace   Urobilinogen, UA     Nitrite, UA neg    Leukocytes, UA Negative Negative   Appearance     Odor      Assessment & Plan:  1) Low-risk pregnancy [id number] at 3561w0d with an Estimated Date of Delivery: 10/10/18     Meds: No orders of the defined types were placed in this encounter.  Labs/procedures today: 2nd IT  Plan:  Continue routine obstetrical care F/u in 4 weeks for Lrob, and u/s: anatomy   Follow-up: Return in about 4 weeks (around 05/23/2018) for LROB, U/s: anatomy.  Orders Placed This Encounter  Procedures  . INTEGRATED 2  . POC Urinalysis Dipstick OB   By signing my name below, I, Arnette NorrisMari Johnson, attest that this documentation has been prepared under the direction and in the presence of Tilda BurrowFerguson, Graylee Arutyunyan V, MD. Electronically Signed: Arnette NorrisMari Johnson Medical Scribe. 04/25/18. 2:57 PM.  I personally performed the services described in this documentation, which was SCRIBED in my presence. The recorded information has been reviewed and considered accurate. It  has been edited as necessary during review. Jonnie Kind, MD

## 2018-04-27 LAB — INTEGRATED 2
AFP MoM: 1
Alpha-Fetoprotein: 33.9 ng/mL
Crown Rump Length: 71.4 mm
DIA MoM: 0.58
DIA Value: 110.1 pg/mL
Estriol, Unconjugated: 1.21 ng/mL
Gest. Age on Collection Date: 13.1 weeks
Gestational Age: 16.1 weeks
Maternal Age at EDD: 35.1 yr
Nuchal Translucency (NT): 1.5 mm
Nuchal Translucency MoM: 0.92
Number of Fetuses: 1
PAPP-A MoM: 1.1
PAPP-A Value: 1480.9 ng/mL
Test Results:: NEGATIVE
Weight: 131 [lb_av]
Weight: 131 [lb_av]
hCG MoM: 0.51
hCG Value: 20.1 IU/mL
uE3 MoM: 1.39

## 2018-05-02 ENCOUNTER — Ambulatory Visit (INDEPENDENT_AMBULATORY_CARE_PROVIDER_SITE_OTHER): Payer: Medicare Other | Admitting: Psychiatry

## 2018-05-02 ENCOUNTER — Encounter (HOSPITAL_COMMUNITY): Payer: Self-pay | Admitting: Psychiatry

## 2018-05-02 VITALS — BP 100/66 | HR 98 | Ht 65.0 in | Wt 138.0 lb

## 2018-05-02 DIAGNOSIS — Z818 Family history of other mental and behavioral disorders: Secondary | ICD-10-CM

## 2018-05-02 DIAGNOSIS — G40909 Epilepsy, unspecified, not intractable, without status epilepticus: Secondary | ICD-10-CM

## 2018-05-02 DIAGNOSIS — Z87891 Personal history of nicotine dependence: Secondary | ICD-10-CM

## 2018-05-02 DIAGNOSIS — Z811 Family history of alcohol abuse and dependence: Secondary | ICD-10-CM | POA: Diagnosis not present

## 2018-05-02 DIAGNOSIS — F331 Major depressive disorder, recurrent, moderate: Secondary | ICD-10-CM

## 2018-05-02 DIAGNOSIS — F419 Anxiety disorder, unspecified: Secondary | ICD-10-CM

## 2018-05-02 DIAGNOSIS — Z79899 Other long term (current) drug therapy: Secondary | ICD-10-CM | POA: Diagnosis not present

## 2018-05-02 MED ORDER — FLUOXETINE HCL 20 MG PO CAPS: 20.0000 mg | ORAL_CAPSULE | Freq: Every day | ORAL | 2 refills | Status: DC

## 2018-05-02 MED ORDER — ZOLPIDEM TARTRATE 10 MG PO TABS: 10.0000 mg | ORAL_TABLET | Freq: Every evening | ORAL | 2 refills | Status: DC | PRN
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
## 2018-05-13 ENCOUNTER — Other Ambulatory Visit: Payer: Self-pay | Admitting: Obstetrics and Gynecology

## 2018-05-13 DIAGNOSIS — Z363 Encounter for antenatal screening for malformations: Secondary | ICD-10-CM

## 2018-05-17 ENCOUNTER — Encounter: Payer: Self-pay | Admitting: Advanced Practice Midwife

## 2018-05-17 ENCOUNTER — Ambulatory Visit (INDEPENDENT_AMBULATORY_CARE_PROVIDER_SITE_OTHER): Payer: Medicare Other | Admitting: Advanced Practice Midwife

## 2018-05-17 ENCOUNTER — Ambulatory Visit (INDEPENDENT_AMBULATORY_CARE_PROVIDER_SITE_OTHER): Payer: Medicare Other

## 2018-05-17 ENCOUNTER — Other Ambulatory Visit: Payer: Self-pay

## 2018-05-17 VITALS — BP 99/64 | HR 85 | Wt 140.0 lb

## 2018-05-17 DIAGNOSIS — Z1389 Encounter for screening for other disorder: Secondary | ICD-10-CM

## 2018-05-17 DIAGNOSIS — Z363 Encounter for antenatal screening for malformations: Secondary | ICD-10-CM

## 2018-05-17 DIAGNOSIS — Z3402 Encounter for supervision of normal first pregnancy, second trimester: Secondary | ICD-10-CM

## 2018-05-17 DIAGNOSIS — Z23 Encounter for immunization: Secondary | ICD-10-CM

## 2018-05-17 DIAGNOSIS — Z3A19 19 weeks gestation of pregnancy: Secondary | ICD-10-CM

## 2018-05-17 DIAGNOSIS — Z331 Pregnant state, incidental: Secondary | ICD-10-CM

## 2018-05-17 LAB — POCT URINALYSIS DIPSTICK OB
Blood, UA: NEGATIVE
Glucose, UA: NEGATIVE
Ketones, UA: NEGATIVE
Leukocytes, UA: NEGATIVE
Nitrite, UA: NEGATIVE
POC,PROTEIN,UA: NEGATIVE

## 2018-05-17 NOTE — Patient Instructions (Signed)
Loretta Padilla, I greatly value your feedback.  If you receive a survey following your visit with [place] today, we appreciate you taking the time to fill it out.  Thanks, Cathie Beams, CNM     Second Trimester of Pregnancy The second trimester is from week 14 through week 27 (months 4 through 6). The second trimester is often a time when you feel your best. Your body has adjusted to being pregnant, and you begin to feel better physically. Usually, morning sickness has lessened or quit completely, you may have more energy, and you may have an increase in appetite. The second trimester is also a time when the fetus is growing rapidly. At the end of the sixth month, the fetus is about 9 inches long and weighs about 1 pounds. You will likely begin to feel the baby move (quickening) between 16 and 20 weeks of pregnancy. Body changes during your second trimester Your body continues to go through many changes during your second trimester. The changes vary from woman to woman.  Your weight will continue to increase. You will notice your lower abdomen bulging out.  You may begin to get stretch marks on your hips, abdomen, and breasts.  You may develop headaches that can be relieved by medicines. The medicines should be approved by your health care provider.  You may urinate more often because the fetus is pressing on your bladder.  You may develop or continue to have heartburn as a result of your pregnancy.  You may develop constipation because certain hormones are causing the muscles that push waste through your intestines to slow down.  You may develop hemorrhoids or swollen, bulging veins (varicose veins).  You may have back pain. This is caused by: ? Weight gain. ? Pregnancy hormones that are relaxing the joints in your pelvis. ? A shift in weight and the muscles that support your balance.  Your breasts will continue to grow and they will continue to become tender.  Your gums may  bleed and may be sensitive to brushing and flossing.  Dark spots or blotches (chloasma, mask of pregnancy) may develop on your face. This will likely fade after the baby is born.  A dark line from your belly button to the pubic area (linea nigra) may appear. This will likely fade after the baby is born.  You may have changes in your hair. These can include thickening of your hair, rapid growth, and changes in texture. Some women also have hair loss during or after pregnancy, or hair that feels dry or thin. Your hair will most likely return to normal after your baby is born.  What to expect at prenatal visits During a routine prenatal visit:  You will be weighed to make sure you and the fetus are growing normally.  Your blood pressure will be taken.  Your abdomen will be measured to track your baby's growth.  The fetal heartbeat will be listened to.  Any test results from the previous visit will be discussed.  Your health care provider may ask you:  How you are feeling.  If you are feeling the baby move.  If you have had any abnormal symptoms, such as leaking fluid, bleeding, severe headaches, or abdominal cramping.  If you are using any tobacco products, including cigarettes, chewing tobacco, and electronic cigarettes.  If you have any questions.  Other tests that may be performed during your second trimester include:  Blood tests that check for: ? Low iron levels (anemia). ? High  blood sugar that affects pregnant women (gestational diabetes) between 42 and 28 weeks. ? Rh antibodies. This is to check for a protein on red blood cells (Rh factor).  Urine tests to check for infections, diabetes, or protein in the urine.  An ultrasound to confirm the proper growth and development of the baby.  An amniocentesis to check for possible genetic problems.  Fetal screens for spina bifida and Down syndrome.  HIV (human immunodeficiency virus) testing. Routine prenatal testing  includes screening for HIV, unless you choose not to have this test.  Follow these instructions at home: Medicines  Follow your health care provider's instructions regarding medicine use. Specific medicines may be either safe or unsafe to take during pregnancy.  Take a prenatal vitamin that contains at least 600 micrograms (mcg) of folic acid.  If you develop constipation, try taking a stool softener if your health care provider approves. Eating and drinking  Eat a balanced diet that includes fresh fruits and vegetables, whole grains, good sources of protein such as meat, eggs, or tofu, and low-fat dairy. Your health care provider will help you determine the amount of weight gain that is right for you.  Avoid raw meat and uncooked cheese. These carry germs that can cause birth defects in the baby.  If you have low calcium intake from food, talk to your health care provider about whether you should take a daily calcium supplement.  Limit foods that are high in fat and processed sugars, such as fried and sweet foods.  To prevent constipation: ? Drink enough fluid to keep your urine clear or pale yellow. ? Eat foods that are high in fiber, such as fresh fruits and vegetables, whole grains, and beans. Activity  Exercise only as directed by your health care provider. Most women can continue their usual exercise routine during pregnancy. Try to exercise for 30 minutes at least 5 days a week. Stop exercising if you experience uterine contractions.  Avoid heavy lifting, wear low heel shoes, and practice good posture.  A sexual relationship may be continued unless your health care provider directs you otherwise. Relieving pain and discomfort  Wear a good support bra to prevent discomfort from breast tenderness.  Take warm sitz baths to soothe any pain or discomfort caused by hemorrhoids. Use hemorrhoid cream if your health care provider approves.  Rest with your legs elevated if you have  leg cramps or low back pain.  If you develop varicose veins, wear support hose. Elevate your feet for 15 minutes, 3-4 times a day. Limit salt in your diet. Prenatal Care  Write down your questions. Take them to your prenatal visits.  Keep all your prenatal visits as told by your health care provider. This is important. Safety  Wear your seat belt at all times when driving.  Make a list of emergency phone numbers, including numbers for family, friends, the hospital, and police and fire departments. General instructions  Ask your health care provider for a referral to a local prenatal education class. Begin classes no later than the beginning of month 6 of your pregnancy.  Ask for help if you have counseling or nutritional needs during pregnancy. Your health care provider can offer advice or refer you to specialists for help with various needs.  Do not use hot tubs, steam rooms, or saunas.  Do not douche or use tampons or scented sanitary pads.  Do not cross your legs for long periods of time.  Avoid cat litter boxes and soil  used by cats. These carry germs that can cause birth defects in the baby and possibly loss of the fetus by miscarriage or stillbirth.  Avoid all smoking, herbs, alcohol, and unprescribed drugs. Chemicals in these products can affect the formation and growth of the baby.  Do not use any products that contain nicotine or tobacco, such as cigarettes and e-cigarettes. If you need help quitting, ask your health care provider.  Visit your dentist if you have not gone yet during your pregnancy. Use a soft toothbrush to brush your teeth and be gentle when you floss. Contact a health care provider if:  You have dizziness.  You have mild pelvic cramps, pelvic pressure, or nagging pain in the abdominal area.  You have persistent nausea, vomiting, or diarrhea.  You have a bad smelling vaginal discharge.  You have pain when you urinate. Get help right away if:  You  have a fever.  You are leaking fluid from your vagina.  You have spotting or bleeding from your vagina.  You have severe abdominal cramping or pain.  You have rapid weight gain or weight loss.  You have shortness of breath with chest pain.  You notice sudden or extreme swelling of your face, hands, ankles, feet, or legs.  You have not felt your baby move in over an hour.  You have severe headaches that do not go away when you take medicine.  You have vision changes. Summary  The second trimester is from week 14 through week 27 (months 4 through 6). It is also a time when the fetus is growing rapidly.  Your body goes through many changes during pregnancy. The changes vary from woman to woman.  Avoid all smoking, herbs, alcohol, and unprescribed drugs. These chemicals affect the formation and growth your baby.  Do not use any tobacco products, such as cigarettes, chewing tobacco, and e-cigarettes. If you need help quitting, ask your health care provider.  Contact your health care provider if you have any questions. Keep all prenatal visits as told by your health care provider. This is important. This information is not intended to replace advice given to you by your health care provider. Make sure you discuss any questions you have with your health care provider.      CHILDBIRTH CLASSES [phone] is the phone number for Pregnancy Classes or hospital tours at [facility] will be referred to  [email or web address] for more information on childbirth classes  At this site you may register for classes. You may sign up for a waiting list if classes are full. Please SIGN UP FOR THIS!.   When the waiting list becomes long, sometimes new classes can be added.

## 2018-05-17 NOTE — Progress Notes (Signed)
[place] 19+1 wks,cephalic,anterior pl gr 0,cx 4.3 cm,normal ovaries bilat,svp of fluid 4.6 cm,fhr 140 bpm,efw 286 g 55%,anatomy complete,no obvious abnormalities

## 2018-05-17 NOTE — Progress Notes (Signed)
LOW-RISK PREGNANCY VISIT Patient name: Loretta Padilla MRN [id number]  Date of birth: 1986/01/04 Chief Complaint:   Routine Prenatal Visit (u/s today)  History of Present Illness:   Loretta Padilla is a 32 y.o. [id number] female at [redacted]w[redacted]d with an Estimated Date of Delivery: 10/10/18 being seen today for ongoing management of a low-risk pregnancy.  Today she reports no c/o other than normal pregnancy complaints.  . Vag. Bleeding: None.  Movement: Absent. denies leaking of fluid. Review of Systems:   Pertinent items are noted in HPI Denies abnormal vaginal discharge w/ itching/odor/irritation, headaches, visual changes, shortness of breath, chest pain, abdominal pain, severe nausea/vomiting, or problems with urination or bowel movements unless otherwise stated above.  Pertinent History Reviewed:  Medical & Surgical Hx:   Past Medical History:  Diagnosis Date  . Anxiety   . Back pain   . Depression   . Kidney stones   . Seizures (HCC)    Past Surgical History:  Procedure Laterality Date  . IMPLANTATION VAGAL NERVE STIMULATOR    . VSD REPAIR     Family History  Problem Relation Age of Onset  . Bipolar disorder Mother   . Alcohol abuse Mother   . Depression Sister   . Anxiety disorder Sister   . Alcohol abuse Maternal Grandfather   . Diabetes Maternal Grandfather   . Heart attack Maternal Grandfather   . Alcohol abuse Paternal Grandfather   . Heart disease Paternal Grandfather   . Other Father        unsure of history  . Asthma Son   . Other Son        EOE  . Colon cancer Maternal Grandmother     Current Outpatient Medications:  .  Doxylamine-Pyridoxine 10-10 MG TBEC, 2 PO qhs; may take 1po in am and 1po in afternoon prn nausea, Disp: 120 tablet, Rfl: 3 .  FLUoxetine (PROZAC) 20 MG capsule, Take 1 capsule (20 mg total) by mouth daily., Disp: 30 capsule, Rfl: 2 .  hydrOXYzine (ATARAX/VISTARIL) 25 MG tablet, Take 1 tablet (25 mg total) by mouth 2 (two) times daily as needed for  anxiety or nausea., Disp: 60 tablet, Rfl: 2 .  oxymetazoline (AFRIN) 0.05 % nasal spray, Place 1 spray into both nostrils 2 (two) times daily as needed for congestion. , Disp: , Rfl:  .  Prenatal Vit-Fe Fumarate-FA (MULTIVITAMIN-PRENATAL) 27-0.8 MG TABS tablet, Take 1 tablet by mouth daily at 12 noon., Disp: , Rfl:  .  promethazine (PHENERGAN) 25 MG tablet, Take 1 tablet (25 mg total) by mouth every 6 (six) hours as needed., Disp: 20 tablet, Rfl: 1 .  topiramate (TOPAMAX) 100 MG tablet, Take 2 tablets (200 mg total) by mouth 2 (two) times daily., Disp: 360 tablet, Rfl: 4 .  zolpidem (AMBIEN) 10 MG tablet, Take 1 tablet (10 mg total) by mouth at bedtime as needed for sleep., Disp: 30 tablet, Rfl: 2 Social History: Reviewed -  reports that she has quit smoking. She smoked 0.25 packs per day. She has never used smokeless tobacco.  Physical Assessment:   Vitals:   05/17/18 1108  BP: 99/64  Pulse: 85  Weight: 140 lb (63.5 kg)  Body mass index is 23.3 kg/m.        Physical Examination:   General appearance: Well appearing, and in no distress  Mental status: Alert, oriented to person, place, and time  Skin: Warm & dry  Cardiovascular: Normal heart rate noted  Respiratory: Normal respiratory effort, no distress  Abdomen:  Soft, gravid, nontender  Pelvic: Cervical exam deferred         Extremities: Edema: None  Fetal Status: Fetal Heart Rate (bpm): 140   Movement: Absent   [place] 19+1 wks,cephalic,anterior pl gr 0,cx 4.3 cm,normal ovaries bilat,svp of fluid 4.6 cm,fhr 140 bpm,efw 286 g 55%,anatomy complete,no obvious abnormalities   Results for orders placed or performed in visit on 05/17/18 (from the past 24 hour(s))  POC Urinalysis Dipstick OB   Collection Time: 05/17/18 11:09 AM  Result Value Ref Range   Color, UA     Clarity, UA     Glucose, UA Negative Negative   Bilirubin, UA     Ketones, UA neg    Spec Grav, UA     Blood, UA neg    pH, UA     POC Protein UA Negative Negative, Trace    Urobilinogen, UA     Nitrite, UA neg    Leukocytes, UA Negative Negative   Appearance     Odor      Assessment & Plan:  1) Low-risk pregnancy [id number] at [redacted]w[redacted]d with an Estimated Date of Delivery: 10/10/18   2) Seizure disorder, stable,     Plan:  Continue routine obstetrical care    Follow-up: Return in about 4 weeks (around 06/14/2018) for LROB.  Orders Placed This Encounter  Procedures  . Flu Vaccine QUAD 36+ mos IM  . POC Urinalysis Dipstick OB   Jacklyn Shell CNM 05/17/2018 2:27 PM

## 2018-05-26 ENCOUNTER — Other Ambulatory Visit: Payer: Self-pay | Admitting: *Deleted

## 2018-05-26 ENCOUNTER — Ambulatory Visit (INDEPENDENT_AMBULATORY_CARE_PROVIDER_SITE_OTHER): Payer: Medicare Other | Admitting: Neurology

## 2018-05-26 ENCOUNTER — Encounter: Payer: Self-pay | Admitting: Neurology

## 2018-05-26 VITALS — BP 111/64 | HR 108 | Ht 65.0 in | Wt 143.0 lb

## 2018-05-26 DIAGNOSIS — G40909 Epilepsy, unspecified, not intractable, without status epilepticus: Secondary | ICD-10-CM

## 2018-05-26 DIAGNOSIS — G40219 Localization-related (focal) (partial) symptomatic epilepsy and epileptic syndromes with complex partial seizures, intractable, without status epilepticus: Secondary | ICD-10-CM | POA: Diagnosis not present

## 2018-05-26 MED ORDER — TOPIRAMATE 100 MG PO TABS: ORAL_TABLET | ORAL | 4 refills | Status: DC

## 2018-05-26 NOTE — Progress Notes (Signed)
[facility]  PATIENT: Loretta Padilla DOB: 02-07-1986   REASON FOR VISIT: Follow-up for seizure disorder HISTORY FROM: Patient    HISTORY OF PRESENT ILLNESS: Marlies Ligman is a 32 year old female, accompanied by her boyfriend [name], seen in refer by her primary care doctor  Avon Gully, for evaluation of seizure, initial evaluation was October 8th 2018.  I reviewed and summarized the referring note, she had a past medical history of depression, epilepsy, is taking Topamax 100 mg, 2 tablets twice a day, She recently moved from [place] to [place] in June 2018,  She was previously under the care of local neurologist Dr.Richard Sharee Holster, MD (Phone: [phone]; Fax:  [phone]; [facility]- [phone] [address] [employer] [phone] [address], [address] [place], [place] [address]).  She reported a history of epilepsy since 32 years old, she used to have generalized tonic-clonic seizure, tried different medications, has been on stable dose of Topamax 100 mg 2 tablets twice a day for many years, but because of frequent recurrent seizures, eventually had VNS placement in December 2014, she reported mild improvement with the VNS, post surgical one year, she has not had generalized seizure, but began to have recurrent smaller spells, staring into the space, unresponsive for few minutes, she began to have increased spells again 2017, now having a spell on a monthly basis, rarely generalized tonic-clonic seizures,  Recent adjustment for her VNS was in June 2018, she noticed wearing off every 5 minutes, with mild left neck pain,   I reviewed the laboratory evaluation in September 2018: Normal CMP, CBC, hemoglobin of 13.2  UPDATE Nov 04 2017:YY She is now taking topamax 100mg  2 tab bid, onfi 20mg  qhs since Oct 2018,   She was having "silenct seizure' when she runs out of her Topamax 400 mg daily, she has dizziness, lightheadedness, staring off  into space, confused, lasting for few second, no GTC events.  Those small seizure-like spells can happen multiple times a day.  Add on onfi 20 mg every night has made a difference, she no longer has recurrent generalized tonic-clonic seizure, before that, she was having it on a monthly basis, She is also taking Prozac for depression, and other simple medication from her psychiatrist office, complains of chronic insomnia, Today we also performed a VNS interrogation, adjustment, she complains of could not tolerate magnet stimulation  Update June 4th 2019:  She has children at age 19, 28, 38, she was taking on topamax while 32 years old has GI issue, 11, health, 5 years ferile seizure.  Last peroid was on May 1, 2nd.  Before that was March 24, 25, 26th.  Home preganncy test was positive  She has not has grandmal seizure, for a long times,     UPDATE Sept 19 2019: She had one seizure on May 24, 2018, feel it coming on, felt dizzy, spinning sensation, loss of consciousness bed, heard her left foot, she is currently [redacted] weeks pregnant, is going to have a boy, due date is on Oct 10 2018.  She continue have smaller spells, staring, unresponsiveness, taking Topamax 100 mg 2 tablets twice a day," more seizure medications did not make any difference"  She could not tolerate strong VNS settings, complains of throat pain  REVIEW OF SYSTEMS: Full 14 system review of systems performed and notable only for those listed, all others are neg:  Fatigue, hearing loss, ringing in ears, runny nose, cough, shortness of breath, nausea, vomiting, insomnia, back pain, walking difficulty, neck  pain, headaches, seizure  ALLERGIES: Allergies  Allergen Reactions  . Adhesive [Tape]     Birth control patch and nicoderm patch   . Carbatrol [Carbamazepine] Rash  . Dilantin [Phenytoin Sodium Extended] Rash  . Tizanidine Hcl Rash  . Venlafaxine Rash    HOME MEDICATIONS: Outpatient Medications Prior to Visit    Medication Sig Dispense Refill  . Doxylamine-Pyridoxine 10-10 MG TBEC 2 PO qhs; may take 1po in am and 1po in afternoon prn nausea 120 tablet 3  . FLUoxetine (PROZAC) 20 MG capsule Take 1 capsule (20 mg total) by mouth daily. 30 capsule 2  . hydrOXYzine (ATARAX/VISTARIL) 25 MG tablet Take 1 tablet (25 mg total) by mouth 2 (two) times daily as needed for anxiety or nausea. 60 tablet 2  . oxymetazoline (AFRIN) 0.05 % nasal spray Place 1 spray into both nostrils 2 (two) times daily as needed for congestion.     . Prenatal Vit-Fe Fumarate-FA (MULTIVITAMIN-PRENATAL) 27-0.8 MG TABS tablet Take 1 tablet by mouth daily at 12 noon.    . promethazine (PHENERGAN) 25 MG tablet Take 1 tablet (25 mg total) by mouth every 6 (six) hours as needed. 20 tablet 1  . topiramate (TOPAMAX) 100 MG tablet Take 2 tablets (200 mg total) by mouth 2 (two) times daily. 360 tablet 4  . zolpidem (AMBIEN) 10 MG tablet Take 1 tablet (10 mg total) by mouth at bedtime as needed for sleep. 30 tablet 2   No facility-administered medications prior to visit.     PAST MEDICAL HISTORY: Past Medical History:  Diagnosis Date  . Anxiety   . Back pain   . Depression   . Kidney stones   . Seizures (HCC)     PAST SURGICAL HISTORY: Past Surgical History:  Procedure Laterality Date  . IMPLANTATION VAGAL NERVE STIMULATOR    . VSD REPAIR      FAMILY HISTORY: Family History  Problem Relation Age of Onset  . Bipolar disorder Mother   . Alcohol abuse Mother   . Depression Sister   . Anxiety disorder Sister   . Alcohol abuse Maternal Grandfather   . Diabetes Maternal Grandfather   . Heart attack Maternal Grandfather   . Alcohol abuse Paternal Grandfather   . Heart disease Paternal Grandfather   . Other Father        unsure of history  . Asthma Son   . Other Son        EOE  . Colon cancer Maternal Grandmother     SOCIAL HISTORY: Social History   Socioeconomic History  . Marital status: Divorced    Spouse name: Not  on file  . Number of children: 3  . Years of education: 3 years college  . Highest education level: Not on file  Occupational History  . Occupation: Disabled  Social Needs  . Financial resource strain: Not on file  . Food insecurity:    Worry: Not on file    Inability: Not on file  . Transportation needs:    Medical: Not on file    Non-medical: Not on file  Tobacco Use  . Smoking status: Former Smoker    Packs/day: 0.25  . Smokeless tobacco: Never Used  Substance and Sexual Activity  . Alcohol use: Not Currently    Comment: occ., 05-07-2017 per pt 2-3 times a mth  . Drug use: No  . Sexual activity: Yes    Birth control/protection: None  Lifestyle  . Physical activity:    Days per week: Not  on file    Minutes per session: Not on file  . Stress: Not on file  Relationships  . Social connections:    Talks on phone: Not on file    Gets together: Not on file    Attends religious service: Not on file    Active member of club or organization: Not on file    Attends meetings of clubs or organizations: Not on file    Relationship status: Not on file  . Intimate partner violence:    Fear of current or ex partner: Not on file    Emotionally abused: Not on file    Physically abused: Not on file    Forced sexual activity: Not on file  Other Topics Concern  . Not on file  Social History Narrative   Lives at home with boyfriend and children.   Right-handed.   Occasional use of caffeine.        PHYSICAL EXAM  Vitals:   05/26/18 1330  BP: 111/64  Pulse: (!) 108  Weight: 143 lb (64.9 kg)  Height: 5\' 5"  (1.651 m)   Body mass index is 23.8 kg/m.  Generalized: Well developed, in no acute distress  Head: normocephalic and atraumatic,. Oropharynx benign  Neck: Supple,  Musculoskeletal: No deformity   Neurological examination   Mentation: Alert oriented to time, place, history taking. Attention span and concentration appropriate. Recent and remote memory intact.  Follows  all commands speech and language fluent.   Cranial nerve II-XII: Pupils were equal round reactive to light extraocular movements were full, visual field were full on confrontational test. Facial sensation and strength were normal. hearing was intact to finger rubbing bilaterally. Uvula tongue midline. head turning and shoulder shrug were normal and symmetric.Tongue protrusion into cheek strength was normal. Motor: normal bulk and tone, full strength in the BUE, BLE,  Sensory: normal and symmetric to light touch,  Coordination: finger-nose-finger, heel-to-shin bilaterally, no dysmetria Reflexes: Symmetric upper and lower, plantar responses were flexor bilaterally. Gait and Station: antaglic gait due to left foot pain  DIAGNOSTIC DATA (LABS, IMAGING, TESTING) - I reviewed patient records, labs, notes, testing and imaging myself where available.  Lab Results  Component Value Date   WBC 5.9 03/15/2018   HGB 10.5 (L) 03/15/2018   HCT 30.6 (L) 03/15/2018   MCV 89.7 03/15/2018   PLT 179 03/15/2018      Component Value Date/Time   NA 133 (L) 03/15/2018 2219   K 3.1 (L) 03/15/2018 2219   CL 106 03/15/2018 2219   CO2 22 03/15/2018 2219   GLUCOSE 111 (H) 03/15/2018 2219   BUN 11 03/15/2018 2219   CREATININE 0.58 03/15/2018 2219   CALCIUM 8.6 (L) 03/15/2018 2219   PROT 6.6 04/04/2017 2300   ALBUMIN 4.2 04/04/2017 2300   AST 13 (L) 04/04/2017 2300   ALT 11 (L) 04/04/2017 2300   ALKPHOS 46 04/04/2017 2300   BILITOT 0.9 04/04/2017 2300   GFRNONAA >60 03/15/2018 2219   GFRAA >60 03/15/2018 2219    ASSESSMENT AND PLAN 32 year old female, Epilepsy  Most recent seizure was on May 24, 2018  Currently [redacted] weeks pregnant, due date was on October 10, 2018  Increase Topamax to 100 2 tablets in the morning, 3 tablets at nighttime,  Check Topamax level   VNS:  Model Demipulse 103, serial [id number], Implant time 2013-08-21.  Generator Serial No. [id number]  Normal OutPut Current: 1.25  mA Signal Frequency: 20 Hz  Pulse Width:  130  sec Signal on time:  30 seconds Signal off time: 1.8  minute     Magnetic Output Current: 1.5 mA Pulse Width: 130 sec Signal On Time:  60 Sec Diagnostic Impedance: 3328  OHMS  l confirmed the VNS settings. The VNS stimulator was interrogated and reprogrammed as above setting (91478)

## 2018-05-27 ENCOUNTER — Encounter (HOSPITAL_COMMUNITY): Payer: Self-pay | Admitting: *Deleted

## 2018-05-27 ENCOUNTER — Emergency Department (HOSPITAL_COMMUNITY)
Admission: EM | Admit: 2018-05-27 | Discharge: 2018-05-27 | Disposition: A | Discharge status: Home / Self Care | Payer: Medicare Other | Attending: Emergency Medicine | Admitting: Emergency Medicine

## 2018-05-27 ENCOUNTER — Other Ambulatory Visit: Payer: Self-pay

## 2018-05-27 ENCOUNTER — Emergency Department (HOSPITAL_COMMUNITY): Payer: Medicare Other

## 2018-05-27 DIAGNOSIS — Z87891 Personal history of nicotine dependence: Secondary | ICD-10-CM | POA: Diagnosis not present

## 2018-05-27 DIAGNOSIS — H73892 Other specified disorders of tympanic membrane, left ear: Secondary | ICD-10-CM | POA: Insufficient documentation

## 2018-05-27 DIAGNOSIS — Z79899 Other long term (current) drug therapy: Secondary | ICD-10-CM | POA: Insufficient documentation

## 2018-05-27 DIAGNOSIS — Z23 Encounter for immunization: Secondary | ICD-10-CM | POA: Diagnosis not present

## 2018-05-27 DIAGNOSIS — O9989 Other specified diseases and conditions complicating pregnancy, childbirth and the puerperium: Secondary | ICD-10-CM | POA: Insufficient documentation

## 2018-05-27 DIAGNOSIS — Z3A21 21 weeks gestation of pregnancy: Secondary | ICD-10-CM | POA: Insufficient documentation

## 2018-05-27 DIAGNOSIS — M79672 Pain in left foot: Secondary | ICD-10-CM

## 2018-05-27 DIAGNOSIS — H6592 Unspecified nonsuppurative otitis media, left ear: Secondary | ICD-10-CM

## 2018-05-27 DIAGNOSIS — S59902A Unspecified injury of left elbow, initial encounter: Secondary | ICD-10-CM | POA: Diagnosis not present

## 2018-05-27 DIAGNOSIS — H73891 Other specified disorders of tympanic membrane, right ear: Secondary | ICD-10-CM | POA: Diagnosis not present

## 2018-05-27 DIAGNOSIS — M25522 Pain in left elbow: Secondary | ICD-10-CM | POA: Diagnosis not present

## 2018-05-27 MED ORDER — TETANUS-DIPHTH-ACELL PERTUSSIS 5-2.5-18.5 LF-MCG/0.5 IM SUSP
0.5000 mL | Freq: Once | INTRAMUSCULAR | Status: AC
  Administered 2018-05-27: 0.5 mL via INTRAMUSCULAR
  Filled 2018-05-27: qty 0.5

## 2018-05-27 MED ORDER — CEPHALEXIN 500 MG PO CAPS: 500.0000 mg | ORAL_CAPSULE | Freq: Three times a day (TID) | ORAL | 0 refills | Status: DC

## 2018-05-27 MED ORDER — FLUTICASONE PROPIONATE 50 MCG/ACT NA SUSP: 1.0000 | Freq: Every day | NASAL | 0 refills | Status: DC

## 2018-05-27 NOTE — ED Provider Notes (Signed)
[facility] EMERGENCY DEPARTMENT Provider Note   CSN: [id number] Arrival date & time: 05/27/18  0741     History   Chief Complaint Chief Complaint  Patient presents with  . Otalgia  . Foot Pain    HPI Loretta Padilla is a 32 y.o. female with a hx of anxiety, depression, and seizures who presents to the ED with complaints of L ear discomfort x 1 week and L foot pain x 3 days. Patient states that for the past 1 week she has had a fullness sensation to the L ear with discomfort, occasional popping sensation, and muffled hearing.  She has noted associated nasal congestion/rhinorrhea. Has utilized afrin nasal spray without significant change, no other specific alleviating/aggravating factors. Denies fever, sore throat, cough, or ear drainage. Patient also with complaints of L foot pain for the past 3 days. She has a hx of epilepsy, had a seizure, and believes she injured the foot during this. When she came back to following the seizure the medial aspect of the L foot was sore and she though she saw a black splinter like object at the surface to the area of pain. She states the dark FB disappeared underneath the skin and therefore she and her boyfriend attempted to dig it out with a needle and tweezers 2 days ago. This AM she woke up with redness to the area and increased pain prompting ER visit. Pain has a burning quality, worse with bearing weight and movement, no alleviating factors. No other noted areas of injury. Denies numbness, weakness, paresthesias, or fevers. She was evaluated by her neurologist yesterday for the seizure. Patient is [redacted] weeks pregnant, she is [facility]. Last tetanus was 10 years prior.  HPI  Past Medical History:  Diagnosis Date  . Anxiety   . Back pain   . Depression   . Kidney stones   . Seizures [facility](HCC)     Patient Active Problem List   Diagnosis Date Noted  . Encounter for other contraceptive management 04/25/2018  .  Supervision of normal pregnancy 03/15/2018  . Insomnia 03/15/2018  . Epilepsy (HCC) 11/23/2017  . Smoker 08/03/2017  . Depression 05/07/2017    Past Surgical History:  Procedure Laterality Date  . IMPLANTATION VAGAL NERVE STIMULATOR    . VSD REPAIR       OB History    Gravida  8   Para  3   Term  3   Preterm      AB  4   Living  3     SAB  2   TAB  2   Ectopic      Multiple      Live Births  3            Home Medications    Prior to Admission medications   Medication Sig Start Date End Date Taking? Authorizing Provider  Doxylamine-Pyridoxine 10-10 MG TBEC 2 PO qhs; may take 1po in am and 1po in afternoon prn nausea 02/16/18   Cresenzo-Dishmon, Scarlette CalicoFrances, CNM  FLUoxetine (PROZAC) 20 MG capsule Take 1 capsule (20 mg total) by mouth daily. 05/02/18   Myrlene Brokeross, Deborah R, MD  hydrOXYzine (ATARAX/VISTARIL) 25 MG tablet Take 1 tablet (25 mg total) by mouth 2 (two) times daily as needed for anxiety or nausea. 02/16/18   Myrlene Brokeross, Deborah R, MD  oxymetazoline (AFRIN) 0.05 % nasal spray Place 1 spray into both nostrils 2 (two) times daily as needed for congestion.  [provider]  Prenatal Vit-Fe Fumarate-FA (MULTIVITAMIN-PRENATAL) 27-0.8 MG TABS tablet Take 1 tablet by mouth daily at 12 noon.    [provider]  promethazine (PHENERGAN) 25 MG tablet Take 1 tablet (25 mg total) by mouth every 6 (six) hours as needed. 03/16/18   Donnetta Hutching, MD  topiramate (TOPAMAX) 100 MG tablet 2 tabs in the morning and 3 tabs at night 05/26/18   Levert Feinstein, MD  zolpidem (AMBIEN) 10 MG tablet Take 1 tablet (10 mg total) by mouth at bedtime as needed for sleep. 05/02/18 06/01/18  Myrlene Broker, MD    Family History Family History  Problem Relation Age of Onset  . Bipolar disorder Mother   . Alcohol abuse Mother   . Depression Sister   . Anxiety disorder Sister   . Alcohol abuse Maternal Grandfather   . Diabetes Maternal Grandfather   . Heart attack Maternal Grandfather    . Alcohol abuse Paternal Grandfather   . Heart disease Paternal Grandfather   . Other Father        unsure of history  . Asthma Son   . Other Son        EOE  . Colon cancer Maternal Grandmother     Social History Social History   Tobacco Use  . Smoking status: Former Smoker    Packs/day: 0.25  . Smokeless tobacco: Never Used  Substance Use Topics  . Alcohol use: Not Currently    Comment: occ., 05-07-2017 per pt 2-3 times a mth  . Drug use: No     Allergies   Adhesive [tape]; Carbatrol [carbamazepine]; Dilantin [phenytoin sodium extended]; Tizanidine hcl; and Venlafaxine   Review of Systems Review of Systems  Constitutional: Negative for chills and fever.  HENT: Positive for congestion, ear pain, hearing loss and rhinorrhea. Negative for ear discharge, sore throat, trouble swallowing and voice change.   Respiratory: Negative for cough and shortness of breath.   Cardiovascular: Negative for chest pain.  Musculoskeletal:       Positive for L foot pain.  Skin: Positive for wound.     Physical Exam Updated Vital Signs BP 101/65 (BP Location: Right Arm)   Pulse 84   Temp 97.7 F (36.5 C) (Oral)   Resp 16   Ht 5\' 5"  (1.651 m)   Wt 64.9 kg   LMP 12/28/2017 (Exact Date)   SpO2 100%   BMI 23.80 kg/m   Physical Exam  Constitutional: She appears well-developed and well-nourished. No distress.  HENT:  Head: Normocephalic and atraumatic.  Right Ear: No drainage, swelling or tenderness. No mastoid tenderness. Tympanic membrane is not perforated, not erythematous, not retracted and not bulging. No middle ear effusion.  Left Ear: No drainage, swelling or tenderness. No mastoid tenderness. Tympanic membrane is not perforated, not erythematous, not retracted and not bulging. A middle ear effusion is present.  Nose: Mucosal edema present.  Mouth/Throat: Uvula is midline and oropharynx is clear and moist. No oropharyngeal exudate or posterior oropharyngeal erythema.  No  mastoid erythema/swelling bilaterally.   Eyes: Pupils are equal, round, and reactive to light. Conjunctivae are normal. Right eye exhibits no discharge. Left eye exhibits no discharge.  Neck: Normal range of motion. Neck supple.  Cardiovascular: Normal rate and regular rhythm.  No murmur heard. 2+ symmetric DP/PT pulses.  Pulmonary/Chest: Breath sounds normal. No respiratory distress. She has no wheezes. She has no rales.  Abdominal: Soft. She exhibits no distension. There is no tenderness.  Musculoskeletal:  Lower extremities: There  is a 1cm diameter area of erythema to the medial aspect of the L 1st MTP joint with central area of increased erythema with what appears to be small previous blistering. No obvious deformity. No appreciable warmth. No palpable fluctuance. No visible foreign body at skin surface. Patient able to move her ankles and all digits. She is tender to palpation directly over area of erythema with light palpation, otherwise nontender. NVI distally.   Lymphadenopathy:    She has no cervical adenopathy.  Neurological: She is alert.  Sensation grossly intact to bilateral lower extremities. 5/5 strength with plantar/dorsiflexion bilaterally. Ambulatory, antalgic gait.   Skin: Skin is warm and dry. Capillary refill takes less than 2 seconds. No rash noted.  Psychiatric: She has a normal mood and affect. Her behavior is normal.  Nursing note and vitals reviewed.      ED Treatments / Results  Labs (all labs ordered are listed, but only abnormal results are displayed) Labs Reviewed - No data to display  EKG None  Radiology Dg Foot Complete Left  Result Date: 05/27/2018 CLINICAL DATA:  Pain following fall EXAM: LEFT FOOT - COMPLETE 3+ VIEW COMPARISON:  None. FINDINGS: Frontal, oblique, and lateral views were obtained. No fracture or dislocation. Joint spaces appear normal. No erosive change. IMPRESSION: No fracture or dislocation.  No apparent arthropathy. Electronically  Signed   By: Bretta Bang III M.D.   On: 05/27/2018 08:56    Procedures Procedures (including critical care time)  EMERGENCY DEPARTMENT US SOFT TISSUE INTERPRETATION "Study: Limited Soft Tissue Ultrasound"  INDICATIONS: Pain Multiple views of the body part were obtained in real-time with a multi-frequency linear probe  PERFORMED BY: Myself IMAGES ARCHIVED?: No SIDE:Left BODY PART:Foot INTERPRETATION:  No evidence of abscess or obvious FB  Medications Ordered in ED Medications  Tdap (BOOSTRIX) injection 0.5 mL (0.5 mLs Intramuscular Given 05/27/18 0932)     Initial Impression / Assessment and Plan / ED Course  I have reviewed the triage vital signs and the nursing notes.  Pertinent labs & imaging results that were available during my care of the patient were reviewed by me and considered in my medical decision making (see chart for details).   Patient is [redacted] weeks pregnant presenting with L ear sxs x 1 week and L foot pain x 3 days. Patient nontoxic appearing, resting comfortably, vitals with soft pressures which appear baseline on chart review.   - L ear fullness/discomfort/popping- likely secondary to middle ear effusion seen on exam with nasal congestion. There is no evidence of AOM, EOM, mastoiditis, or perforated tympanic membrane. Will give Floanse to assist with this. Discussed discontinuation of afrin and to only use this for < 48 hour time periods for this type of condition due to potential adverse affects. Tylenol for pain.   - L foot discomfort s/p seizure activity- she has seen her neurologist and been evaluated for her seizure therefore do not feel this is necessary today in the ER. The L medial 1st MTP area is erythematous. X-ray obtained negative for fracture/dislocation or radiopaque FB. Bedside US without obvious FB or abscess. Will cover for infection with Keflex. Tdap updated- instructed patient to inform her obgyn of this. Tylenol for pain.   Findings and plan  of care discussed with supervising physician Dr. Jacqulyn Bath- in agreement.   I discussed results, treatment plan, need for follow-up, and return precautions with the patient. Provided opportunity for questions, patient confirmed understanding and is in agreement with plan.    Final Clinical  Impressions(s) / ED Diagnoses   Final diagnoses:  Left foot pain  Fluid level behind tympanic membrane of left ear    ED Discharge Orders         Ordered    cephALEXin (KEFLEX) 500 MG capsule  3 times daily     05/27/18 0924    fluticasone (FLONASE) 50 MCG/ACT nasal spray  Daily     05/27/18 0924           Cherly Anderson, PA-C 05/27/18 1012    Maia Plan, MD 05/27/18 1506

## 2018-05-27 NOTE — ED Notes (Signed)
Patient transported to X-ray 

## 2018-05-27 NOTE — Discharge Instructions (Addendum)
You were seen in the ER for left ear and foot pain.  We suspect that your ear symptoms are related to your nasal congestion causing fluid in the ear.  We are prescribing you Flonase, and intranasal steroid to help with this.  You may use this daily.  Please stop using Afrin as it can have adverse effects if you use it for too long of a period of time.  Regarding your foot pain, your x-ray and the ultrasound we did did not show any evidence of foreign body, there were no fractures or dislocations.  We are placing you on Keflex, and antibiotic to help with potential infection.  We have prescribed you new medication(s) today. Discuss the medications prescribed today with your pharmacist as they can have adverse effects and interactions with your other medicines including over the counter and prescribed medications. Seek medical evaluation if you start to experience new or abnormal symptoms after taking one of these medicines, seek care immediately if you start to experience difficulty breathing, feeling of your throat closing, facial swelling, or rash as these could be indications of a more serious allergic reaction  Please take Tylenol per over-the-counter dosing for any discomfort you experience.  We have updated your tetanus (Tdap) vaccination at your visit today, be sure to inform your OB/GYN that she received this vaccination while in the emergency department.   We would like you to follow-up closely with your primary care provider within 5 days for reevaluation.  Return to the ER sooner for new or worsening symptoms including but not limited to worsening pain, fever, spreading redness, or any other concerns.

## 2018-05-27 NOTE — ED Triage Notes (Addendum)
Pt c/o popping sensation and difficulty hearing out of left ear since Friday. Pt also c/o left foot pain after she had a seizure three days ago and fell out of bed into her wall. Pt has abrasion to left foot and reports it "burns". Pt concerned "something might be in my foot". Pt is [redacted] weeks pregnant. [facility]. Pt denies any pregnancy complaints.

## 2018-06-14 ENCOUNTER — Encounter: Payer: Self-pay | Admitting: Obstetrics & Gynecology

## 2018-06-14 ENCOUNTER — Other Ambulatory Visit: Payer: Self-pay

## 2018-06-14 ENCOUNTER — Ambulatory Visit (INDEPENDENT_AMBULATORY_CARE_PROVIDER_SITE_OTHER): Payer: Medicare Other | Admitting: Obstetrics & Gynecology

## 2018-06-14 VITALS — BP 120/71 | HR 100 | Wt 150.0 lb

## 2018-06-14 DIAGNOSIS — Z3482 Encounter for supervision of other normal pregnancy, second trimester: Secondary | ICD-10-CM

## 2018-06-14 DIAGNOSIS — O99352 Diseases of the nervous system complicating pregnancy, second trimester: Secondary | ICD-10-CM

## 2018-06-14 DIAGNOSIS — Z1389 Encounter for screening for other disorder: Secondary | ICD-10-CM

## 2018-06-14 DIAGNOSIS — Z331 Pregnant state, incidental: Secondary | ICD-10-CM

## 2018-06-14 DIAGNOSIS — O99342 Other mental disorders complicating pregnancy, second trimester: Secondary | ICD-10-CM

## 2018-06-14 DIAGNOSIS — O0992 Supervision of high risk pregnancy, unspecified, second trimester: Secondary | ICD-10-CM

## 2018-06-14 DIAGNOSIS — Z3A23 23 weeks gestation of pregnancy: Secondary | ICD-10-CM

## 2018-06-14 LAB — POCT URINALYSIS DIPSTICK OB
Blood, UA: NEGATIVE
Glucose, UA: NEGATIVE
Ketones, UA: NEGATIVE
Leukocytes, UA: NEGATIVE
Nitrite, UA: NEGATIVE
POC,PROTEIN,UA: NEGATIVE

## 2018-06-14 NOTE — Progress Notes (Signed)
   HIGH-RISK PREGNANCY VISIT Patient name: Loretta Padilla MRN [id number]  Date of birth: Feb 06, 1986 Chief Complaint:   Routine Prenatal Visit  History of Present Illness:   Loretta Padilla is a 32 y.o. [id number] female at [redacted]w[redacted]d with an Estimated Date of Delivery: 10/10/18 being seen today for ongoing management of a high-risk pregnancy complicated by seizure disorder, depression.  Today she reports still having some seizures.  . Vag. Bleeding: None.  Movement: Present. denies leaking of fluid.  Review of Systems:   Pertinent items are noted in HPI Denies abnormal vaginal discharge w/ itching/odor/irritation, headaches, visual changes, shortness of breath, chest pain, abdominal pain, severe nausea/vomiting, or problems with urination or bowel movements unless otherwise stated above. Pertinent History Reviewed:  Reviewed past medical,surgical, social, obstetrical and family history.  Reviewed problem list, medications and allergies. Physical Assessment:   Vitals:   06/14/18 1019  BP: 120/71  Pulse: 100  Weight: 150 lb (68 kg)  Body mass index is 24.96 kg/m.           Physical Examination:   General appearance: alert, well appearing, and in no distress  Mental status: alert, oriented to person, place, and time  Skin: warm & dry   Extremities: Edema: None    Cardiovascular: normal heart rate noted  Respiratory: normal respiratory effort, no distress  Abdomen: gravid, soft, non-tender  Pelvic: Cervical exam deferred         Fetal Status: Fetal Heart Rate (bpm): 145 Fundal Height: 24 cm Movement: Present    Fetal Surveillance Testing today: FHR 145   Results for orders placed or performed in visit on 06/14/18 (from the past 24 hour(s))  POC Urinalysis Dipstick OB   Collection Time: 06/14/18 10:19 AM  Result Value Ref Range   Color, UA     Clarity, UA     Glucose, UA Negative Negative   Bilirubin, UA     Ketones, UA neg    Spec Grav, UA     Blood, UA neg    pH, UA     POC  Protein UA Negative Negative, Trace   Urobilinogen, UA     Nitrite, UA neg    Leukocytes, UA Negative Negative   Appearance     Odor      Assessment & Plan:  1) High-risk pregnancy [id number] at [redacted]w[redacted]d with an Estimated Date of Delivery: 10/10/18   2) Seizure disorder, unstable, on topamax, can't take keppra or lamictal  3) Depression/insomnia, stable  Meds: No orders of the defined types were placed in this encounter.   Labs/procedures today:   Treatment Plan:  PN2 next visit  Reviewed: Preterm labor symptoms and general obstetric precautions including but not limited to vaginal bleeding, contractions, leaking of fluid and fetal movement were reviewed in detail with the patient.  All questions were answered.  Follow-up: Return in about 4 weeks (around 07/12/2018) for Pn2, HROB.  Orders Placed This Encounter  Procedures  . POC Urinalysis Dipstick OB   Amaryllis Dyke Mittie Knittel  06/14/2018 10:51 AM

## 2018-06-20 ENCOUNTER — Encounter (HOSPITAL_COMMUNITY): Payer: Self-pay | Admitting: Emergency Medicine

## 2018-06-20 ENCOUNTER — Emergency Department (HOSPITAL_COMMUNITY)
Admission: EM | Admit: 2018-06-20 | Discharge: 2018-06-21 | Disposition: A | Discharge status: Home / Self Care | Payer: Medicare Other | Attending: Emergency Medicine | Admitting: Emergency Medicine

## 2018-06-20 DIAGNOSIS — R410 Disorientation, unspecified: Secondary | ICD-10-CM | POA: Diagnosis not present

## 2018-06-20 DIAGNOSIS — I959 Hypotension, unspecified: Secondary | ICD-10-CM | POA: Diagnosis not present

## 2018-06-20 DIAGNOSIS — R000 Tachycardia, unspecified: Secondary | ICD-10-CM | POA: Diagnosis not present

## 2018-06-20 DIAGNOSIS — Z79899 Other long term (current) drug therapy: Secondary | ICD-10-CM | POA: Insufficient documentation

## 2018-06-20 DIAGNOSIS — R109 Unspecified abdominal pain: Secondary | ICD-10-CM | POA: Insufficient documentation

## 2018-06-20 DIAGNOSIS — G40909 Epilepsy, unspecified, not intractable, without status epilepticus: Secondary | ICD-10-CM | POA: Diagnosis not present

## 2018-06-20 DIAGNOSIS — O9989 Other specified diseases and conditions complicating pregnancy, childbirth and the puerperium: Secondary | ICD-10-CM | POA: Insufficient documentation

## 2018-06-20 DIAGNOSIS — Z3A24 24 weeks gestation of pregnancy: Secondary | ICD-10-CM | POA: Insufficient documentation

## 2018-06-20 DIAGNOSIS — R404 Transient alteration of awareness: Secondary | ICD-10-CM | POA: Diagnosis not present

## 2018-06-20 DIAGNOSIS — R569 Unspecified convulsions: Secondary | ICD-10-CM | POA: Insufficient documentation

## 2018-06-20 DIAGNOSIS — O99352 Diseases of the nervous system complicating pregnancy, second trimester: Secondary | ICD-10-CM | POA: Diagnosis not present

## 2018-06-20 DIAGNOSIS — Z87891 Personal history of nicotine dependence: Secondary | ICD-10-CM | POA: Diagnosis not present

## 2018-06-20 LAB — BASIC METABOLIC PANEL
Anion gap: 7 (ref 5–15)
BUN: 8 mg/dL (ref 6–20)
CO2: 21 mmol/L — ABNORMAL LOW (ref 22–32)
Calcium: 8.9 mg/dL (ref 8.9–10.3)
Chloride: 106 mmol/L (ref 98–111)
Creatinine, Ser: 0.65 mg/dL (ref 0.44–1.00)
GFR calc Af Amer: >60 mL/min (ref >60)
GFR calc non Af Amer: >60 mL/min (ref >60)
Glucose, Bld: 91 mg/dL (ref 70–99)
Potassium: 3.9 mmol/L (ref 3.5–5.1)
Sodium: 134 mmol/L — ABNORMAL LOW (ref 135–145)

## 2018-06-20 LAB — CBC
HCT: 31.2 % — ABNORMAL LOW (ref 36.0–46.0)
Hemoglobin: 10.3 g/dL — ABNORMAL LOW (ref 12.0–15.0)
MCH: 32 pg (ref 26.0–34.0)
MCHC: 33 g/dL (ref 30.0–36.0)
MCV: 96.9 fL (ref 80.0–100.0)
Platelets: 203 10*3/uL (ref 150–400)
RBC: 3.22 MIL/uL — ABNORMAL LOW (ref 3.87–5.11)
RDW: 12.9 % (ref 11.5–15.5)
WBC: 8.4 10*3/uL (ref 4.0–10.5)
nRBC: 0 % (ref 0.0–0.2)

## 2018-06-20 LAB — HCG, QUANTITATIVE, PREGNANCY: hCG, Beta Chain, Quant, S: 5469 m[IU]/mL — ABNORMAL HIGH (ref <5)

## 2018-06-20 LAB — URINALYSIS, ROUTINE W REFLEX MICROSCOPIC
Bilirubin Urine: NEGATIVE
Glucose, UA: NEGATIVE mg/dL
Hgb urine dipstick: NEGATIVE
Ketones, ur: NEGATIVE mg/dL
Leukocytes, UA: NEGATIVE
Nitrite: NEGATIVE
Protein, ur: NEGATIVE mg/dL
Specific Gravity, Urine: 1.005 (ref 1.005–1.030)
pH: 8 (ref 5.0–8.0)

## 2018-06-20 LAB — MAGNESIUM: Magnesium: 1.6 mg/dL — ABNORMAL LOW (ref 1.7–2.4)

## 2018-06-20 NOTE — Progress Notes (Addendum)
Dr Vergie Living notified of SVE & FHR tracing, provider also evaluated remotely. OB cleared. Would like pt to follow up with OB provider this week. EDMD also aware of FPMD POC.

## 2018-06-20 NOTE — Progress Notes (Signed)
Spoke with Dr Vergie Living, FPMD oncall. Notified of pt and c/o's, and current FHR/toco tracing. MD would like cervical exam and FHR monitoring until EDMD decides pt's disposition.

## 2018-06-20 NOTE — Discharge Instructions (Addendum)
Work-up here tonight without any specific abnormalities.  Baby was evaluated by rapid response OB nurse.  Everything checked out okay.  Labs without significant abnormalities.  Recommend that she contact her [place] neurologist in the morning to see what adjustments they may want to make if any at all.  Continue taking your Topamax 200 mg in the morning and a total of 300 mg at bedtime.  Return for any new or worse symptoms or recurrent seizures.  Follow-up with [facility] in [place] as scheduled.

## 2018-06-20 NOTE — Progress Notes (Signed)
Pt at [facility] with c/o post-seizure with fall from bed and lower abdominal pain. She is a G8P3 at [redacted]wk gestation. A pt of [facility]. Pt states she has not noticed fetal movement, however fetus can be heard moving on monitor and seen moving upon abdominal visual assessment. No leaking of fluid, no bleeding. Will assess FHR and update FPMD.

## 2018-06-20 NOTE — Progress Notes (Signed)
Dr Vergie Living notified of SVE & FHR tracing, provider also evaluated remotely. OB cleared. Would like pt to follow up with OB provider this week.

## 2018-06-20 NOTE — ED Triage Notes (Signed)
Pt from home via [facility], family witnessed a 3 minute seizure on her bed (unsure if she fell)  (hx of), A&O x4. 111/50, P 94. Pt is currently [redacted] weeks pregnant. Pt currently c/o abdominal, back, neck and head pain.

## 2018-06-20 NOTE — ED Provider Notes (Signed)
[facility] EMERGENCY DEPARTMENT Provider Note   CSN: [id number] Arrival date & time: 06/20/18  2111     History   Chief Complaint Chief Complaint  Patient presents with  . Seizures  . Abdominal Pain    HPI Loretta Padilla is a 32 y.o. female.  Patient with known history of seizures.  Patient is currently [redacted] weeks pregnant.  Her due date is October 10, 2018.  She is gravida 8 para 3.  Patient followed by [facility] in [place].  Just recently seen by Dr. Despina Hidden on October 8.  Also followed by [facility] neurology and was last seen by them on September 19.  Both parties are aware that there is been some difficulty controlling her seizures.  On the 19th her Topamax which is a medication she is on was increased to 200 mg in the morning and 300 mg at bedtime.  Patient also has a VNS.  That was also adjusted recently by [facility] neurology.  Patient apparently has not tolerated other antiseizure meds very well.  Patient denies any vaginal bleeding any discomfort with urination any fevers.  Patient seizures are generalized in nature but sometimes she gets a prodrome and does not go into that.  Her last seizure was September 20 or thereabouts.  And then she had another seizure here today.  She did not fall.  She went down to her knees.  There was somebody at home with her.  She did go into a generalized seizure and had postictal.  Patient denies any incontinence I did not injure her tongue.  Patient's been having seizures according to the records since about age 6.     Past Medical History:  Diagnosis Date  . Anxiety   . Back pain   . Depression   . Kidney stones   . Seizures [facility])     Patient Active Problem List   Diagnosis Date Noted  . Encounter for other contraceptive management 04/25/2018  . Supervision of normal pregnancy 03/15/2018  . Insomnia 03/15/2018  . Epilepsy (HCC) 11/23/2017  . Smoker 08/03/2017  . Depression 05/07/2017    Past Surgical  History:  Procedure Laterality Date  . IMPLANTATION VAGAL NERVE STIMULATOR    . VSD REPAIR       OB History    Gravida  8   Para  3   Term  3   Preterm      AB  4   Living  3     SAB  2   TAB  2   Ectopic      Multiple      Live Births  3            Home Medications    Prior to Admission medications   Medication Sig Start Date End Date Taking? Authorizing Provider  Doxylamine-Pyridoxine 10-10 MG TBEC 2 PO qhs; may take 1po in am and 1po in afternoon prn nausea 02/16/18   Cresenzo-Dishmon, Scarlette Calico, CNM  FLUoxetine (PROZAC) 20 MG capsule Take 1 capsule (20 mg total) by mouth daily. 05/02/18   Myrlene Broker, MD  fluticasone (FLONASE) 50 MCG/ACT nasal spray Place 1 spray into both nostrils daily. 05/27/18   Petrucelli, Samantha R, PA-C  hydrOXYzine (ATARAX/VISTARIL) 25 MG tablet Take 1 tablet (25 mg total) by mouth 2 (two) times daily as needed for anxiety or nausea. 02/16/18   Myrlene Broker, MD  oxymetazoline (AFRIN) 0.05 % nasal spray Place 1 spray into both nostrils 2 (two)  times daily as needed for congestion.     [provider]  Prenatal Vit-Fe Fumarate-FA (MULTIVITAMIN-PRENATAL) 27-0.8 MG TABS tablet Take 1 tablet by mouth daily at 12 noon.    [provider]  promethazine (PHENERGAN) 25 MG tablet Take 1 tablet (25 mg total) by mouth every 6 (six) hours as needed. 03/16/18   Donnetta Hutching, MD  topiramate (TOPAMAX) 100 MG tablet 2 tabs in the morning and 3 tabs at night 05/26/18   Levert Feinstein, MD  zolpidem (AMBIEN) 10 MG tablet Take 1 tablet (10 mg total) by mouth at bedtime as needed for sleep. 05/02/18 06/01/18  Myrlene Broker, MD    Family History Family History  Problem Relation Age of Onset  . Bipolar disorder Mother   . Alcohol abuse Mother   . Depression Sister   . Anxiety disorder Sister   . Alcohol abuse Maternal Grandfather   . Diabetes Maternal Grandfather   . Heart attack Maternal Grandfather   . Alcohol abuse Paternal Grandfather    . Heart disease Paternal Grandfather   . Other Father        unsure of history  . Asthma Son   . Other Son        EOE  . Colon cancer Maternal Grandmother     Social History Social History   Tobacco Use  . Smoking status: Former Smoker    Packs/day: 0.25  . Smokeless tobacco: Never Used  Substance Use Topics  . Alcohol use: Not Currently    Comment: occ., 05-07-2017 per pt 2-3 times a mth  . Drug use: No     Allergies   Adhesive [tape]; Carbatrol [carbamazepine]; Dilantin [phenytoin sodium extended]; Tizanidine hcl; and Venlafaxine   Review of Systems Review of Systems  Constitutional: Negative for fever.  HENT: Negative for congestion.   Eyes: Negative for visual disturbance.  Respiratory: Negative for shortness of breath.   Cardiovascular: Negative for chest pain.  Gastrointestinal: Positive for abdominal pain. Negative for nausea and vomiting.  Genitourinary: Negative for dysuria, hematuria and vaginal bleeding.  Musculoskeletal: Negative for back pain and neck pain.  Skin: Negative for rash and wound.  Neurological: Positive for seizures.  Hematological: Does not bruise/bleed easily.  Psychiatric/Behavioral: Negative for confusion.     Physical Exam Updated Vital Signs BP 102/68   Pulse 89 Comment: Simultaneous filing. User may not have seen previous data.  Temp 98.5 F (36.9 C) (Oral)   Resp 16 Comment: Simultaneous filing. User may not have seen previous data.  LMP 12/28/2017 (Exact Date)   SpO2 99% Comment: Simultaneous filing. User may not have seen previous data.  Physical Exam  Constitutional: She appears well-developed and well-nourished.  HENT:  Head: Normocephalic and atraumatic.  Mouth/Throat: Oropharynx is clear and moist.  Eyes: Pupils are equal, round, and reactive to light. Conjunctivae and EOM are normal.  Neck: Normal range of motion. Neck supple.  Cardiovascular: Normal rate, regular rhythm and normal heart sounds.  Pulmonary/Chest:  Effort normal and breath sounds normal. No respiratory distress.  Abdominal: Soft. Bowel sounds are normal. There is no tenderness.  Gravid  Nursing note and vitals reviewed.    ED Treatments / Results  Labs (all labs ordered are listed, but only abnormal results are displayed) Labs Reviewed  URINALYSIS, ROUTINE W REFLEX MICROSCOPIC - Abnormal; Notable for the following components:      Result Value   Color, Urine STRAW (*)    All other components within normal limits  CBC - Abnormal;  Notable for the following components:   RBC 3.22 (*)    Hemoglobin 10.3 (*)    HCT 31.2 (*)    All other components within normal limits  BASIC METABOLIC PANEL - Abnormal; Notable for the following components:   Sodium 134 (*)    CO2 21 (*)    All other components within normal limits  MAGNESIUM - Abnormal; Notable for the following components:   Magnesium 1.6 (*)    All other components within normal limits  HCG, QUANTITATIVE, PREGNANCY - Abnormal; Notable for the following components:   hCG, Beta Chain, Quant, S 5,469 (*)    All other components within normal limits    EKG None  Radiology No results found.  Procedures Procedures (including critical care time)  Medications Ordered in ED Medications - No data to display   Initial Impression / Assessment and Plan / ED Course  I have reviewed the triage vital signs and the nursing notes.  Pertinent labs & imaging results that were available during my care of the patient were reviewed by me and considered in my medical decision making (see chart for details).     Baby screening by rapid response OB nurse.  No complicating factors.  Lab work-up without significant abnormalities other than a mild hypo-magnesium.  But is just 1/10 off from normal.  Reviewed patient's follow-up with [facility] neurology.  Seems that they are struggling to help control the seizures.  Patient states she is taking her Topamax with the higher doses that were  prescribed on September 19.  Patient has been stable here.  Baby cleared by rapid response OB.  Recommend contacting her neurologist to go for neurology in the morning make them aware of the recurrent seizure and to see what I have her adjustments they want to do.   In addition urinalysis is normal.   Final Clinical Impressions(s) / ED Diagnoses   Final diagnoses:  Seizure (HCC)  [redacted] weeks gestation of pregnancy    ED Discharge Orders    None       Vanetta Mulders, MD 06/20/18 2340

## 2018-06-21 ENCOUNTER — Telehealth: Payer: Self-pay | Admitting: Neurology

## 2018-06-21 DIAGNOSIS — R569 Unspecified convulsions: Secondary | ICD-10-CM

## 2018-06-21 NOTE — Telephone Encounter (Signed)
I called pt and advised her that Dr. Terrace Arabia would like to order a topiramate level before adjusting her topamax dose.  Pt can come tomorrow at 10:00am for her blood draw.  Pt is asking if her VNS needs to be adjusted because she is having uncontrolled seizures and the VNS does not seem to be working. She asked that I respond via mychart.

## 2018-06-21 NOTE — Telephone Encounter (Signed)
  -----   Message -----  From: [name]  Sent: 06/20/2018 10:56 PM EDT  To: [facility] Clinical Pool  Subject: Visit Follow-Up Question               Had a seizure tonight. In the er waiting to go home or be admitted. Er doc recommend i contact you and has not decided to adjust meds at this time.i felt it this time and was able to lay on the floor before becoming unconscious. Bf says it lasted about 10 mins and i was incoherent for about an hour after coming conscious   Please call patient, I ordered Topamax level at her previous visit on May 26, 2018, she did not have it draw.  I have put in order for Topamax level, will adjust the Topamax dosage based on the level,  She is currently pregnant, due in February 2020

## 2018-06-22 ENCOUNTER — Other Ambulatory Visit (INDEPENDENT_AMBULATORY_CARE_PROVIDER_SITE_OTHER): Payer: Self-pay

## 2018-06-22 ENCOUNTER — Other Ambulatory Visit: Payer: Self-pay | Admitting: *Deleted

## 2018-06-22 DIAGNOSIS — Z0289 Encounter for other administrative examinations: Secondary | ICD-10-CM

## 2018-06-22 DIAGNOSIS — Z79899 Other long term (current) drug therapy: Secondary | ICD-10-CM

## 2018-06-22 DIAGNOSIS — R569 Unspecified convulsions: Secondary | ICD-10-CM

## 2018-06-22 NOTE — Telephone Encounter (Signed)
Per vo by Dr. Terrace Arabia, we will wait for topiramate level to come back prior to changing VNS settings.  She would like the patient worked into her schedule.

## 2018-06-22 NOTE — Telephone Encounter (Signed)
It is ok to add her on my schedule the day she come for lab for me to see her vns setting

## 2018-06-22 NOTE — Telephone Encounter (Signed)
Spoke to patient - she lives 45 minutes away and is having to be financially cautious with her money right now (copay, gas).  She was just here today for labs.  She would like Dr. Terrace Arabia to review her topiramate results first and then make a decision of whether she needs to come in to the office.

## 2018-06-23 LAB — TOPIRAMATE LEVEL: Topiramate Lvl: 12.9 ug/mL (ref 2.0–25.0)

## 2018-06-23 MED ORDER — TOPIRAMATE 100 MG PO TABS: ORAL_TABLET | ORAL | 3 refills | Status: DC

## 2018-06-23 NOTE — Addendum Note (Signed)
Addended by: Lindell Spar C on: 06/23/2018 03:30 PM   Modules accepted: Orders

## 2018-06-23 NOTE — Telephone Encounter (Signed)
Patient's topiramate level is 12.9.  She is currently taking topiramate 100mg , 2 tablets in am and 3 tablets in pm.  Per vo by Dr. Terrace Arabia, increase topiramate 100mg  to three tablets in am and three tablets in pm.  Pt verbalized understanding and she is agreeable to this plan.  New rx sent to pharmacy.  Instructed patient to call us for any continued seizure activity.  If necessary, we can get her worked into Dr. Zannie Cove schedule to have her VNS settings evaluated.

## 2018-07-12 ENCOUNTER — Ambulatory Visit (INDEPENDENT_AMBULATORY_CARE_PROVIDER_SITE_OTHER): Payer: Medicare Other | Admitting: Obstetrics & Gynecology

## 2018-07-12 ENCOUNTER — Other Ambulatory Visit: Payer: Medicare Other

## 2018-07-12 ENCOUNTER — Encounter: Payer: Self-pay | Admitting: Obstetrics & Gynecology

## 2018-07-12 VITALS — BP 107/67 | HR 84 | Wt 163.5 lb

## 2018-07-12 DIAGNOSIS — O0992 Supervision of high risk pregnancy, unspecified, second trimester: Secondary | ICD-10-CM

## 2018-07-12 DIAGNOSIS — Z3482 Encounter for supervision of other normal pregnancy, second trimester: Secondary | ICD-10-CM | POA: Diagnosis not present

## 2018-07-12 DIAGNOSIS — Z3A27 27 weeks gestation of pregnancy: Secondary | ICD-10-CM

## 2018-07-12 DIAGNOSIS — G40909 Epilepsy, unspecified, not intractable, without status epilepticus: Secondary | ICD-10-CM

## 2018-07-12 DIAGNOSIS — Z131 Encounter for screening for diabetes mellitus: Secondary | ICD-10-CM | POA: Diagnosis not present

## 2018-07-12 DIAGNOSIS — Z1389 Encounter for screening for other disorder: Secondary | ICD-10-CM

## 2018-07-12 DIAGNOSIS — Z331 Pregnant state, incidental: Secondary | ICD-10-CM

## 2018-07-12 DIAGNOSIS — O99351 Diseases of the nervous system complicating pregnancy, first trimester: Secondary | ICD-10-CM

## 2018-07-12 LAB — POCT URINALYSIS DIPSTICK OB
Blood, UA: NEGATIVE
Glucose, UA: NEGATIVE
Ketones, UA: NEGATIVE
Leukocytes, UA: NEGATIVE
Nitrite, UA: NEGATIVE
POC,PROTEIN,UA: NEGATIVE

## 2018-07-12 NOTE — Progress Notes (Signed)
   HIGH-RISK PREGNANCY VISIT Patient name: Loretta Padilla MRN [id number]  Date of birth: 1986-05-19 Chief Complaint:   Routine Prenatal Visit  History of Present Illness:   Loretta Padilla is a 32 y.o. [id number] female at [redacted]w[redacted]d with an Estimated Date of Delivery: 10/10/18 being seen today for ongoing management of a high-risk pregnancy complicated by seizure disorder.  Today she reports backache and pelvic pressure.  . Vag. Bleeding: None.  Movement: Present. denies leaking of fluid.  Review of Systems:   Pertinent items are noted in HPI Denies abnormal vaginal discharge w/ itching/odor/irritation, headaches, visual changes, shortness of breath, chest pain, abdominal pain, severe nausea/vomiting, or problems with urination or bowel movements unless otherwise stated above. Pertinent History Reviewed:  Reviewed past medical,surgical, social, obstetrical and family history.  Reviewed problem list, medications and allergies. Physical Assessment:   Vitals:   07/12/18 0939  BP: 107/67  Pulse: 84  Weight: 163 lb 8 oz (74.2 kg)  Body mass index is 27.21 kg/m.           Physical Examination:   General appearance: alert, well appearing, and in no distress  Mental status: alert, oriented to person, place, and time  Skin: warm & dry   Extremities: Edema: None    Cardiovascular: normal heart rate noted  Respiratory: normal respiratory effort, no distress  Abdomen: gravid, soft, non-tender  Pelvic: Cervical exam deferred         Fetal Status: Fetal Heart Rate (bpm): 155 Fundal Height: 27 cm Movement: Present    Fetal Surveillance Testing today: FHR 155   Results for orders placed or performed in visit on 07/12/18 (from the past 24 hour(s))  POC Urinalysis Dipstick OB   Collection Time: 07/12/18  9:44 AM  Result Value Ref Range   Color, UA     Clarity, UA     Glucose, UA Negative Negative   Bilirubin, UA     Ketones, UA neg    Spec Grav, UA     Blood, UA neg    pH, UA     POC Protein  UA Negative Negative, Trace   Urobilinogen, UA     Nitrite, UA neg    Leukocytes, UA Negative Negative   Appearance     Odor      Assessment & Plan:  1) High-risk pregnancy [id number] at [redacted]w[redacted]d with an Estimated Date of Delivery: 10/10/18   2) Seizure disorder, improved on increased topamax  3) Dpression/insomnia, stable  Meds: No orders of the defined types were placed in this encounter.   Labs/procedures today: PN2  Treatment Plan:  Routine care  Reviewed: Preterm labor symptoms and general obstetric precautions including but not limited to vaginal bleeding, contractions, leaking of fluid and fetal movement were reviewed in detail with the patient.  All questions were answered.  Follow-up: Return in about 3 weeks (around 08/02/2018) for HROB.  Orders Placed This Encounter  Procedures  . POC Urinalysis Dipstick OB   Loretta Padilla  07/12/2018 10:01 AM

## 2018-07-13 LAB — RPR: RPR Ser Ql: NONREACTIVE

## 2018-07-13 LAB — CBC
Hematocrit: 29.7 % — ABNORMAL LOW (ref 34.0–46.6)
Hemoglobin: 10.2 g/dL — ABNORMAL LOW (ref 11.1–15.9)
MCH: 32.2 pg (ref 26.6–33.0)
MCHC: 34.3 g/dL (ref 31.5–35.7)
MCV: 94 fL (ref 79–97)
Platelets: 224 10*3/uL (ref 150–450)
RBC: 3.17 x10E6/uL — ABNORMAL LOW (ref 3.77–5.28)
RDW: 11.6 % — ABNORMAL LOW (ref 12.3–15.4)
WBC: 7.2 10*3/uL (ref 3.4–10.8)

## 2018-07-13 LAB — GLUCOSE TOLERANCE, 2 HOURS W/ 1HR
Glucose, 1 hour: 176 mg/dL (ref 65–179)
Glucose, 2 hour: 123 mg/dL (ref 65–152)
Glucose, Fasting: 85 mg/dL (ref 65–91)

## 2018-07-13 LAB — ANTIBODY SCREEN: Antibody Screen: NEGATIVE

## 2018-07-13 LAB — HIV ANTIBODY (ROUTINE TESTING W REFLEX): HIV Screen 4th Generation wRfx: NONREACTIVE

## 2018-07-31 ENCOUNTER — Emergency Department (HOSPITAL_COMMUNITY)
Admission: EM | Admit: 2018-07-31 | Discharge: 2018-07-31 | Disposition: A | Discharge status: Home / Self Care | Payer: Medicare Other | Attending: Emergency Medicine | Admitting: Emergency Medicine

## 2018-07-31 ENCOUNTER — Other Ambulatory Visit: Payer: Self-pay

## 2018-07-31 DIAGNOSIS — Z79899 Other long term (current) drug therapy: Secondary | ICD-10-CM | POA: Diagnosis not present

## 2018-07-31 DIAGNOSIS — O9989 Other specified diseases and conditions complicating pregnancy, childbirth and the puerperium: Secondary | ICD-10-CM | POA: Diagnosis not present

## 2018-07-31 DIAGNOSIS — Z87891 Personal history of nicotine dependence: Secondary | ICD-10-CM | POA: Diagnosis not present

## 2018-07-31 DIAGNOSIS — M545 Low back pain, unspecified: Secondary | ICD-10-CM

## 2018-07-31 DIAGNOSIS — Z3A29 29 weeks gestation of pregnancy: Secondary | ICD-10-CM | POA: Diagnosis not present

## 2018-07-31 LAB — CBC WITH DIFFERENTIAL/PLATELET
Abs Immature Granulocytes: 0.04 10*3/uL (ref 0.00–0.07)
Basophils Absolute: 0 10*3/uL (ref 0.0–0.1)
Basophils Relative: 0 %
Eosinophils Absolute: 0.1 10*3/uL (ref 0.0–0.5)
Eosinophils Relative: 2 %
HCT: 33.3 % — ABNORMAL LOW (ref 36.0–46.0)
Hemoglobin: 10.6 g/dL — ABNORMAL LOW (ref 12.0–15.0)
Immature Granulocytes: 1 %
Lymphocytes Relative: 16 %
Lymphs Abs: 1.1 10*3/uL (ref 0.7–4.0)
MCH: 30.4 pg (ref 26.0–34.0)
MCHC: 31.8 g/dL (ref 30.0–36.0)
MCV: 95.4 fL (ref 80.0–100.0)
Monocytes Absolute: 0.4 10*3/uL (ref 0.1–1.0)
Monocytes Relative: 6 %
Neutro Abs: 5.1 10*3/uL (ref 1.7–7.7)
Neutrophils Relative %: 75 %
Platelets: 216 10*3/uL (ref 150–400)
RBC: 3.49 MIL/uL — ABNORMAL LOW (ref 3.87–5.11)
RDW: 11.9 % (ref 11.5–15.5)
WBC: 6.8 10*3/uL (ref 4.0–10.5)
nRBC: 0 % (ref 0.0–0.2)

## 2018-07-31 LAB — URINALYSIS, ROUTINE W REFLEX MICROSCOPIC
Bilirubin Urine: NEGATIVE
Glucose, UA: NEGATIVE mg/dL
Hgb urine dipstick: NEGATIVE
Ketones, ur: 20 mg/dL — AB
Leukocytes, UA: NEGATIVE
Nitrite: NEGATIVE
Protein, ur: NEGATIVE mg/dL
Specific Gravity, Urine: 1.018 (ref 1.005–1.030)
pH: 5 (ref 5.0–8.0)

## 2018-07-31 LAB — COMPREHENSIVE METABOLIC PANEL
ALT: 9 U/L (ref 0–44)
AST: 13 U/L — ABNORMAL LOW (ref 15–41)
Albumin: 3 g/dL — ABNORMAL LOW (ref 3.5–5.0)
Alkaline Phosphatase: 52 U/L (ref 38–126)
Anion gap: 8 (ref 5–15)
BUN: 12 mg/dL (ref 6–20)
CO2: 19 mmol/L — ABNORMAL LOW (ref 22–32)
Calcium: 8.5 mg/dL — ABNORMAL LOW (ref 8.9–10.3)
Chloride: 107 mmol/L (ref 98–111)
Creatinine, Ser: 0.74 mg/dL (ref 0.44–1.00)
GFR calc Af Amer: >60 mL/min (ref >60)
GFR calc non Af Amer: >60 mL/min (ref >60)
Glucose, Bld: 91 mg/dL (ref 70–99)
Potassium: 3.2 mmol/L — ABNORMAL LOW (ref 3.5–5.1)
Sodium: 134 mmol/L — ABNORMAL LOW (ref 135–145)
Total Bilirubin: 0.4 mg/dL (ref 0.3–1.2)
Total Protein: 6.2 g/dL — ABNORMAL LOW (ref 6.5–8.1)

## 2018-07-31 LAB — LIPASE, BLOOD: Lipase: 27 U/L (ref 11–51)

## 2018-07-31 MED ORDER — ACETAMINOPHEN 500 MG PO TABS
1000.0000 mg | ORAL_TABLET | Freq: Once | ORAL | Status: AC
  Administered 2018-07-31: 1000 mg via ORAL
  Filled 2018-07-31: qty 2

## 2018-07-31 NOTE — ED Triage Notes (Addendum)
Pt to ED from home for evaluation of back/bilateral flank pain onset last night. Denies injury or over exertion. Pt has tried Tylenol, ice, and heat with no relief. Pt denies urinary symptoms other than not having the urge to urinate. Pt [redacted] weeks pregnant.

## 2018-07-31 NOTE — ED Provider Notes (Signed)
I saw and evaluated the patient, reviewed the resident's note and I agree with the findings and plan.  EKG: None  Final diagnoses:  Acute bilateral low back pain without sciatica      Linwood DibblesKnapp, Bradden Tadros, MD 07/31/18 2302

## 2018-07-31 NOTE — Progress Notes (Signed)
Spoke with Dr. Despina HiddenEure. Pt is a G8P3 at 29 6/7 weeks here with c/o back pain in the middle of her back. No vaginal bleeding or leaking of fluid. FHR baseline is 135BPM, mod variability, accels, no decels. No uc's tracing monitor and none palpated. Pt is being foloowed by [facility] for her epilepsy and says her last seizure was a month ago.Pt is OB cleared. ED staff notified.

## 2018-07-31 NOTE — ED Provider Notes (Signed)
[facility] EMERGENCY DEPARTMENT Provider Note   CSN: [id number] Arrival date & time: 07/31/18  1517     History   Chief Complaint Chief Complaint  Patient presents with  . Back Pain    HPI Loretta Padilla is a 32 y.o. female.  The history is provided by the patient. No language interpreter was used.  Back Pain   This is a new problem. The current episode started yesterday. The problem occurs constantly. The problem has not changed since onset.The pain is associated with no known injury. The pain is present in the thoracic spine. The pain does not radiate. The pain is moderate. Worse during: waxed and waned. Associated symptoms include pelvic pain. Pertinent negatives include no chest pain, no fever, no numbness, no weight loss, no headaches, no abdominal pain, no abdominal swelling, no bladder incontinence, no dysuria and no weakness. She has tried analgesics and ice for the symptoms. The treatment provided no relief. Risk factors include pregnancy.    Past Medical History:  Diagnosis Date  . Anxiety   . Back pain   . Depression   . Kidney stones   . Seizures [facility])     Patient Active Problem List   Diagnosis Date Noted  . Encounter for other contraceptive management 04/25/2018  . Supervision of normal pregnancy 03/15/2018  . Insomnia 03/15/2018  . Epilepsy (HCC) 11/23/2017  . Smoker 08/03/2017  . Depression 05/07/2017    Past Surgical History:  Procedure Laterality Date  . IMPLANTATION VAGAL NERVE STIMULATOR    . VSD REPAIR       OB History    Gravida  8   Para  3   Term  3   Preterm      AB  4   Living  3     SAB  2   TAB  2   Ectopic      Multiple      Live Births  3            Home Medications    Prior to Admission medications   Medication Sig Start Date End Date Taking? Authorizing Provider  Doxylamine-Pyridoxine 10-10 MG TBEC 2 PO qhs; may take 1po in am and 1po in afternoon prn nausea Patient not taking:  Reported on 07/12/2018 02/16/18   Cresenzo-Dishmon, Scarlette Calico, CNM  FLUoxetine (PROZAC) 20 MG capsule Take 1 capsule (20 mg total) by mouth daily. 05/02/18   Myrlene Broker, MD  fluticasone (FLONASE) 50 MCG/ACT nasal spray Place 1 spray into both nostrils daily. 05/27/18   Petrucelli, Samantha R, PA-C  hydrOXYzine (ATARAX/VISTARIL) 25 MG tablet Take 1 tablet (25 mg total) by mouth 2 (two) times daily as needed for anxiety or nausea. 02/16/18   Myrlene Broker, MD  oxymetazoline (AFRIN) 0.05 % nasal spray Place 1 spray into both nostrils 2 (two) times daily as needed for congestion.     [provider]  Prenatal Vit-Fe Fumarate-FA (MULTIVITAMIN-PRENATAL) 27-0.8 MG TABS tablet Take 1 tablet by mouth daily at 12 noon.    [provider]  promethazine (PHENERGAN) 25 MG tablet Take 1 tablet (25 mg total) by mouth every 6 (six) hours as needed. Patient not taking: Reported on 07/12/2018 03/16/18   Donnetta Hutching, MD  topiramate (TOPAMAX) 100 MG tablet 3 tabs in the morning and 3 tabs at night 06/23/18   Levert Feinstein, MD  zolpidem (AMBIEN) 10 MG tablet Take 1 tablet (10 mg total) by mouth at bedtime as needed for sleep.  05/02/18 06/01/18  Myrlene Brokeross, Deborah R, MD    Family History Family History  Problem Relation Age of Onset  . Bipolar disorder Mother   . Alcohol abuse Mother   . Depression Sister   . Anxiety disorder Sister   . Alcohol abuse Maternal Grandfather   . Diabetes Maternal Grandfather   . Heart attack Maternal Grandfather   . Alcohol abuse Paternal Grandfather   . Heart disease Paternal Grandfather   . Other Father        unsure of history  . Asthma Son   . Other Son        EOE  . Colon cancer Maternal Grandmother     Social History Social History   Tobacco Use  . Smoking status: Former Smoker    Packs/day: 0.25  . Smokeless tobacco: Never Used  Substance Use Topics  . Alcohol use: Not Currently    Comment: occ., 05-07-2017 per pt 2-3 times a mth  . Drug use: No      Allergies   Adhesive [tape]; Carbatrol [carbamazepine]; Dilantin [phenytoin sodium extended]; Tizanidine hcl; and Venlafaxine   Review of Systems Review of Systems  Constitutional: Positive for activity change. Negative for chills, fever and weight loss.  HENT: Negative for ear pain and sore throat.   Eyes: Negative for pain and visual disturbance.  Respiratory: Negative for cough and shortness of breath.   Cardiovascular: Negative for chest pain and palpitations.  Gastrointestinal: Negative for abdominal pain and vomiting.  Genitourinary: Positive for pelvic pain. Negative for bladder incontinence, dysuria and hematuria.  Musculoskeletal: Positive for back pain. Negative for arthralgias.  Skin: Negative for color change and rash.  Neurological: Negative for seizures, syncope, weakness, numbness and headaches.  All other systems reviewed and are negative.    Physical Exam Updated Vital Signs BP 100/62   Pulse 67   Temp 98.8 F (37.1 C) (Oral)   Resp 16   Ht 5\' 5"  (1.651 m)   Wt 74.4 kg   LMP 12/28/2017 (Exact Date)   SpO2 100%   BMI 27.29 kg/m   Physical Exam  Constitutional: She appears well-developed and well-nourished. No distress.  HENT:  Head: Normocephalic and atraumatic.  Eyes: Conjunctivae are normal.  Neck: Neck supple.  Cardiovascular: Normal rate and regular rhythm.  No murmur heard. Pulmonary/Chest: Effort normal and breath sounds normal. No respiratory distress.  Abdominal: Soft. She exhibits distension. There is no tenderness. There is no rigidity, no rebound, no guarding and no CVA tenderness.  Musculoskeletal: She exhibits no edema.       Thoracic back: She exhibits no tenderness and no bony tenderness.       Lumbar back: She exhibits no tenderness and no bony tenderness.  Neurological: She is alert.  Skin: Skin is warm and dry.  Psychiatric: She has a normal mood and affect.  Nursing note and vitals reviewed.    ED Treatments / Results   Labs (all labs ordered are listed, but only abnormal results are displayed) Labs Reviewed  URINALYSIS, ROUTINE W REFLEX MICROSCOPIC - Abnormal; Notable for the following components:      Result Value   APPearance HAZY (*)    Ketones, ur 20 (*)    All other components within normal limits  CBC WITH DIFFERENTIAL/PLATELET - Abnormal; Notable for the following components:   RBC 3.49 (*)    Hemoglobin 10.6 (*)    HCT 33.3 (*)    All other components within normal limits  COMPREHENSIVE METABOLIC PANEL - Abnormal;  Notable for the following components:   Sodium 134 (*)    Potassium 3.2 (*)    CO2 19 (*)    Calcium 8.5 (*)    Total Protein 6.2 (*)    Albumin 3.0 (*)    AST 13 (*)    All other components within normal limits  URINE CULTURE  LIPASE, BLOOD    EKG None  Radiology No results found.  Procedures Procedures (including critical care time)  Medications Ordered in ED Medications  acetaminophen (TYLENOL) tablet 1,000 mg (1,000 mg Oral Given 07/31/18 1716)     Initial Impression / Assessment and Plan / ED Course  I have reviewed the triage vital signs and the nursing notes.  Pertinent labs & imaging results that were available during my care of the patient were reviewed by me and considered in my medical decision making (see chart for details).     Patient is a 32 y.o. female who is [redacted]w[redacted]d pregnant presenting to the ED for worsening back pain that has waxed and waned since last night. Patient states that she has had a lot of chronic back pain and pelvic pain in the pregnancy but acutely worsened since last night.  Rapid OB nurse was consulted, patient evaluated and determined safe for discharge, no concerning findings.  Basic labs were obtained which were unremarkable. UA had no signs of infection. Patient given 1L LR bolus and PO tylenol and minimal response. Findings were discussed with patient at the bedside, all questions were answered. Patient safe for discharge.   Strict return precautions were discussed.   Final Clinical Impressions(s) / ED Diagnoses   Final diagnoses:  Acute bilateral low back pain without sciatica    ED Discharge Orders    None       Joaquin Courts, MD 07/31/18 1801    Linwood Dibbles, MD 07/31/18 2318

## 2018-07-31 NOTE — ED Notes (Signed)
OB Nurse at bedside. 

## 2018-07-31 NOTE — Progress Notes (Signed)
Pt is a G8P3 at 9529 6/[redacted] weeks gestation here with c/o back pain in the middle of her back. Pt denies any problems with this pregnancy or previous pregnancies. Says she does have a hx of seizures and takes Topromax 100mg , three tablets twice a day. Says her last seizure was one month ago.Denies vaginal bleeding or leaking of fluid.

## 2018-07-31 NOTE — ED Notes (Signed)
Patient verbalizes understanding of discharge instructions. Opportunity for questioning and answers were provided. Armband removed by staff, pt discharged from ED.  

## 2018-08-01 LAB — URINE CULTURE

## 2018-08-02 ENCOUNTER — Encounter (HOSPITAL_COMMUNITY): Payer: Self-pay | Admitting: Psychiatry

## 2018-08-02 ENCOUNTER — Ambulatory Visit (INDEPENDENT_AMBULATORY_CARE_PROVIDER_SITE_OTHER): Payer: Medicare Other | Admitting: Obstetrics & Gynecology

## 2018-08-02 ENCOUNTER — Ambulatory Visit (INDEPENDENT_AMBULATORY_CARE_PROVIDER_SITE_OTHER): Payer: Medicare Other | Admitting: Psychiatry

## 2018-08-02 VITALS — BP 121/79 | HR 74 | Ht 65.0 in | Wt 162.0 lb

## 2018-08-02 VITALS — BP 114/75 | HR 84 | Wt 163.0 lb

## 2018-08-02 DIAGNOSIS — F331 Major depressive disorder, recurrent, moderate: Secondary | ICD-10-CM

## 2018-08-02 DIAGNOSIS — O99351 Diseases of the nervous system complicating pregnancy, first trimester: Secondary | ICD-10-CM

## 2018-08-02 DIAGNOSIS — Z3A30 30 weeks gestation of pregnancy: Secondary | ICD-10-CM

## 2018-08-02 DIAGNOSIS — G40909 Epilepsy, unspecified, not intractable, without status epilepticus: Secondary | ICD-10-CM

## 2018-08-02 DIAGNOSIS — O0992 Supervision of high risk pregnancy, unspecified, second trimester: Secondary | ICD-10-CM

## 2018-08-02 DIAGNOSIS — Z331 Pregnant state, incidental: Secondary | ICD-10-CM

## 2018-08-02 DIAGNOSIS — Z1389 Encounter for screening for other disorder: Secondary | ICD-10-CM

## 2018-08-02 LAB — POCT URINALYSIS DIPSTICK OB
Blood, UA: NEGATIVE
Glucose, UA: NEGATIVE
Leukocytes, UA: NEGATIVE
Nitrite, UA: NEGATIVE
POC,PROTEIN,UA: NEGATIVE

## 2018-08-02 MED ORDER — ZOLPIDEM TARTRATE 10 MG PO TABS: 10.0000 mg | ORAL_TABLET | Freq: Every evening | ORAL | 2 refills | Status: DC | PRN

## 2018-08-02 MED ORDER — FLUOXETINE HCL 20 MG PO CAPS: 20.0000 mg | ORAL_CAPSULE | Freq: Every day | ORAL | 2 refills | Status: DC

## 2018-08-02 MED ORDER — CYCLOBENZAPRINE HCL 10 MG PO TABS: 10.0000 mg | ORAL_TABLET | Freq: Three times a day (TID) | ORAL | 0 refills | Status: DC | PRN

## 2018-08-02 NOTE — Progress Notes (Signed)
HIGH-RISK PREGNANCY VISIT Patient name: Loretta Padilla MRN [id number]  Date of birtCaleen Essexh: 06/07/1986 Chief Complaint:   Routine Prenatal Visit  History of Present Illness:   Loretta EssexMelonie Quinonez is a 32 y.o. [id number] female at 6785w1d with an Estimated Date of Delivery: 10/10/18 being seen today for ongoing management of a high-risk pregnancy complicated by seizure disorder.  Today she reports backache. States she has tried heating pads, stretching, and warm baths with only minimal relief. Has also been seen in ED. Has also been using prenatal cradle without much benefit.  . Vag. Bleeding: None.  Movement: Present. denies leaking of fluid.  Review of Systems:   Pertinent items are noted in HPI Denies abnormal vaginal discharge w/ itching/odor/irritation, headaches, visual changes, shortness of breath, chest pain, abdominal pain, severe nausea/vomiting, or problems with urination or bowel movements unless otherwise stated above. Pertinent History Reviewed:  Reviewed past medical,surgical, social, obstetrical and family history.  Reviewed problem list, medications and allergies. Physical Assessment:   Vitals:   08/02/18 0901  BP: 114/75  Pulse: 84  Weight: 163 lb (73.9 kg)  Body mass index is 27.12 kg/m.           Physical Examination:   General appearance: alert, well appearing, and in no distress  Mental status: alert, oriented to person, place, and time  Skin: warm & dry   Extremities: Edema: None    Cardiovascular: normal heart rate noted  Respiratory: normal respiratory effort, no distress  Abdomen: gravid, soft, non-tender  Pelvic: Cervical exam deferred   MSK: no tenderness to palpation of cervical, thoracic, lumbar spine. bilateral tenderness of paraspinal musculature of thoracic and lumbar spine        Fetal Status: Fetal Heart Rate (bpm): 138 Fundal Height: 30 cm Movement: Present    Fetal Surveillance Testing today: FHR 138   Results for orders placed or performed in visit on  08/02/18 (from the past 24 hour(s))  POC Urinalysis Dipstick OB   Collection Time: 08/02/18  9:05 AM  Result Value Ref Range   Color, UA     Clarity, UA     Glucose, UA Negative Negative   Bilirubin, UA     Ketones, UA large    Spec Grav, UA     Blood, UA neg    pH, UA     POC,PROTEIN,UA Negative Negative, Trace, Small (1+), Moderate (2+), Large (3+), 4+   Urobilinogen, UA     Nitrite, UA neg    Leukocytes, UA Negative Negative   Appearance     Odor      Assessment & Plan:  1) High-risk pregnancy [id number] at 6785w1d with an Estimated Date of Delivery: 10/10/18   2) Seizure disorder, stable. Continue topamax   3) Depression/insomnia, stable  4) Back pain, likely MSK related. Discussed at length.  Recommended back exercises and stretching.  Discussed against wall back exercises and demonstrated to patient.  Recommended heat PRN.  RX for flexeril 10mg  tid PRN given.   Meds:  Meds ordered this encounter  Medications  . cyclobenzaprine (FLEXERIL) 10 MG tablet    Sig: Take 1 tablet (10 mg total) by mouth 3 (three) times daily as needed for muscle spasms.    Dispense:  45 tablet    Refill:  0    Labs/procedures today: UA (neg)  Treatment Plan:  Follow up in 2 weeks   Reviewed: Preterm labor symptoms and general obstetric precautions including but not limited to vaginal bleeding, contractions, leaking of fluid and fetal  movement were reviewed in detail with the patient.  All questions were answered.  Follow-up: Return in about 2 weeks (around 08/16/2018).  Orders Placed This Encounter  Procedures  . POC Urinalysis Dipstick OB   Oralia Manis, DO, PGY-2 08/02/2018 10:04 AM
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
## 2018-08-16 ENCOUNTER — Ambulatory Visit (INDEPENDENT_AMBULATORY_CARE_PROVIDER_SITE_OTHER): Payer: Medicare Other | Admitting: Obstetrics & Gynecology

## 2018-08-16 ENCOUNTER — Encounter: Payer: Self-pay | Admitting: Obstetrics & Gynecology

## 2018-08-16 VITALS — BP 106/69 | HR 93 | Wt 166.0 lb

## 2018-08-16 DIAGNOSIS — Z1389 Encounter for screening for other disorder: Secondary | ICD-10-CM

## 2018-08-16 DIAGNOSIS — Z3A32 32 weeks gestation of pregnancy: Secondary | ICD-10-CM

## 2018-08-16 DIAGNOSIS — Z331 Pregnant state, incidental: Secondary | ICD-10-CM

## 2018-08-16 DIAGNOSIS — G40909 Epilepsy, unspecified, not intractable, without status epilepticus: Secondary | ICD-10-CM

## 2018-08-16 DIAGNOSIS — O99353 Diseases of the nervous system complicating pregnancy, third trimester: Secondary | ICD-10-CM

## 2018-08-16 DIAGNOSIS — Z3483 Encounter for supervision of other normal pregnancy, third trimester: Secondary | ICD-10-CM

## 2018-08-16 LAB — POCT URINALYSIS DIPSTICK OB
Blood, UA: NEGATIVE
Glucose, UA: NEGATIVE
Ketones, UA: NEGATIVE
Leukocytes, UA: NEGATIVE
Nitrite, UA: NEGATIVE
POC,PROTEIN,UA: NEGATIVE

## 2018-08-16 NOTE — Progress Notes (Signed)
   HIGH-RISK PREGNANCY VISIT Patient name: Loretta Padilla MRN [id number]  Date of birth: 08/02/1986 Chief Complaint:   Routine Prenatal Visit  History of Present Illness:   Loretta Padilla is a 32 y.o. [id number] female at 5416w1d with an Estimated Date of Delivery: 10/10/18 being seen today for ongoing management of a high-risk pregnancy complicated by seizure disorder.  Today she reports backache and pelvic pressure. Contractions: Not present.  .  Movement: Present. denies leaking of fluid.  Review of Systems:   Pertinent items are noted in HPI Denies abnormal vaginal discharge w/ itching/odor/irritation, headaches, visual changes, shortness of breath, chest pain, abdominal pain, severe nausea/vomiting, or problems with urination or bowel movements unless otherwise stated above. Pertinent History Reviewed:  Reviewed past medical,surgical, social, obstetrical and family history.  Reviewed problem list, medications and allergies. Physical Assessment:   Vitals:   08/16/18 1002  BP: 106/69  Pulse: 93  Weight: 166 lb (75.3 kg)  Body mass index is 27.62 kg/m.           Physical Examination:   General appearance: alert, well appearing, and in no distress  Mental status: alert, oriented to person, place, and time  Skin: warm & dry   Extremities: Edema: Trace    Cardiovascular: normal heart rate noted  Respiratory: normal respiratory effort, no distress  Abdomen: gravid, soft, non-tender  Pelvic: Cervical exam deferred         Fetal Status:     Movement: Present    Fetal Surveillance Testing today:    Results for orders placed or performed in visit on 08/16/18 (from the past 24 hour(s))  POC Urinalysis Dipstick OB   Collection Time: 08/16/18 10:12 AM  Result Value Ref Range   Color, UA     Clarity, UA     Glucose, UA Negative Negative   Bilirubin, UA     Ketones, UA neg    Spec Grav, UA     Blood, UA neg    pH, UA     POC,PROTEIN,UA Negative Negative, Trace, Small (1+), Moderate  (2+), Large (3+), 4+   Urobilinogen, UA     Nitrite, UA neg    Leukocytes, UA Negative Negative   Appearance     Odor      Assessment & Plan:  1) High-risk pregnancy [id number] at 2916w1d with an Estimated Date of Delivery: 10/10/18   2) Seizure disorder, stable, none for a few weeks  3) Dpression/insomnia, stable, improved  Meds: No orders of the defined types were placed in this encounter.   Labs/procedures today:   Treatment Plan:  Continue Keppra  Reviewed: Preterm labor symptoms and general obstetric precautions including but not limited to vaginal bleeding, contractions, leaking of fluid and fetal movement were reviewed in detail with the patient.  All questions were answered.  Follow-up: Return in about 2 weeks (around 08/30/2018) for HROB.  Orders Placed This Encounter  Procedures  . POC Urinalysis Dipstick OB   Amaryllis DykeLuther H Elon Lomeli 08/16/2018 10:54 AM

## 2018-09-01 ENCOUNTER — Encounter: Payer: Self-pay | Admitting: Obstetrics and Gynecology

## 2018-09-01 ENCOUNTER — Ambulatory Visit (INDEPENDENT_AMBULATORY_CARE_PROVIDER_SITE_OTHER): Payer: Medicare Other | Admitting: Obstetrics and Gynecology

## 2018-09-01 VITALS — BP 107/66 | HR 83 | Wt 164.4 lb

## 2018-09-01 DIAGNOSIS — Z331 Pregnant state, incidental: Secondary | ICD-10-CM

## 2018-09-01 DIAGNOSIS — G40909 Epilepsy, unspecified, not intractable, without status epilepticus: Secondary | ICD-10-CM

## 2018-09-01 DIAGNOSIS — Z3A34 34 weeks gestation of pregnancy: Secondary | ICD-10-CM

## 2018-09-01 DIAGNOSIS — O99353 Diseases of the nervous system complicating pregnancy, third trimester: Secondary | ICD-10-CM

## 2018-09-01 DIAGNOSIS — Z1389 Encounter for screening for other disorder: Secondary | ICD-10-CM

## 2018-09-01 LAB — POCT URINALYSIS DIPSTICK OB
Blood, UA: NEGATIVE
Glucose, UA: NEGATIVE
Ketones, UA: NEGATIVE
Leukocytes, UA: NEGATIVE
Nitrite, UA: NEGATIVE
POC,PROTEIN,UA: NEGATIVE

## 2018-09-01 NOTE — Progress Notes (Signed)
Subjective:  Loretta EssexMelonie Padilla is a 32 y.o. [id number] at 365w3d being seen today for ongoing prenatal care.  She is currently monitored for the following issues for this high-risk pregnancy and has Depression; Smoker; Epilepsy (HCC); Supervision of normal pregnancy; Insomnia; and Encounter for other contraceptive management on their problem list.  Patient reports general discomforts of pregnancy.  Contractions: Irregular. Vag. Bleeding: None.  Movement: Present. Denies leaking of fluid.   The following portions of the patient's history were reviewed and updated as appropriate: allergies, current medications, past family history, past medical history, past social history, past surgical history and problem list. Problem list updated.  Objective:   Vitals:   09/01/18 1141  BP: 107/66  Pulse: 83  Weight: 164 lb 6.4 oz (74.6 kg)    Fetal Status:     Movement: Present     General:  Alert, oriented and cooperative. Patient is in no acute distress.  Skin: Skin is warm and dry. No rash noted.   Cardiovascular: Normal heart rate noted  Respiratory: Normal respiratory effort, no problems with respiration noted  Abdomen: Soft, gravid, appropriate for gestational age. Pain/Pressure: Present     Pelvic:  Cervical exam deferred        Extremities: Normal range of motion.  Edema: Trace  Mental Status: Normal mood and affect. Normal behavior. Normal judgment and thought content.   Urinalysis:      Assessment and Plan:  Pregnancy: [id number] at 615w3d  1. Screening for genitourinary condition  - POC Urinalysis Dipstick OB  2. Pregnant state, incidental Stable Vaginal cultures next visit - POC Urinalysis Dipstick OB  3. Nonintractable epilepsy without status epilepticus, unspecified epilepsy type (HCC) Stable On Topamax Followed by Neurology  Preterm labor symptoms and general obstetric precautions including but not limited to vaginal bleeding, contractions, leaking of fluid and fetal movement  were reviewed in detail with the patient. Please refer to After Visit Summary for other counseling recommendations.  Return in about 2 weeks (around 09/15/2018) for OB visit.   Hermina StaggersErvin, Tani Virgo L, MD

## 2018-09-01 NOTE — Patient Instructions (Signed)
Third Trimester of Pregnancy The third trimester is from week 28 through week 40 (months 7 through 9). The third trimester is a time when the unborn baby (fetus) is growing rapidly. At the end of the ninth month, the fetus is about 20 inches in length and weighs 6-10 pounds. Body changes during your third trimester Your body will continue to go through many changes during pregnancy. The changes vary from woman to woman. During the third trimester:  Your weight will continue to increase. You can expect to gain 25-35 pounds (11-16 kg) by the end of the pregnancy.  You may begin to get stretch marks on your hips, abdomen, and breasts.  You may urinate more often because the fetus is moving lower into your pelvis and pressing on your bladder.  You may develop or continue to have heartburn. This is caused by increased hormones that slow down muscles in the digestive tract.  You may develop or continue to have constipation because increased hormones slow digestion and cause the muscles that push waste through your intestines to relax.  You may develop hemorrhoids. These are swollen veins (varicose veins) in the rectum that can itch or be painful.  You may develop swollen, bulging veins (varicose veins) in your legs.  You may have increased body aches in the pelvis, back, or thighs. This is due to weight gain and increased hormones that are relaxing your joints.  You may have changes in your hair. These can include thickening of your hair, rapid growth, and changes in texture. Some women also have hair loss during or after pregnancy, or hair that feels dry or thin. Your hair will most likely return to normal after your baby is born.  Your breasts will continue to grow and they will continue to become tender. A yellow fluid (colostrum) may leak from your breasts. This is the first milk you are producing for your baby.  Your belly button may stick out.  You may notice more swelling in your hands,  face, or ankles.  You may have increased tingling or numbness in your hands, arms, and legs. The skin on your belly may also feel numb.  You may feel short of breath because of your expanding uterus.  You may have more problems sleeping. This can be caused by the size of your belly, increased need to urinate, and an increase in your body's metabolism.  You may notice the fetus "dropping," or moving lower in your abdomen (lightening).  You may have increased vaginal discharge.  You may notice your joints feel loose and you may have pain around your pelvic bone. What to expect at prenatal visits You will have prenatal exams every 2 weeks until week 36. Then you will have weekly prenatal exams. During a routine prenatal visit:  You will be weighed to make sure you and the baby are growing normally.  Your blood pressure will be taken.  Your abdomen will be measured to track your baby's growth.  The fetal heartbeat will be listened to.  Any test results from the previous visit will be discussed.  You may have a cervical check near your due date to see if your cervix has softened or thinned (effaced).  You will be tested for Group B streptococcus. This happens between 35 and 37 weeks. Your health care provider may ask you:  What your birth plan is.  How you are feeling.  If you are feeling the baby move.  If you have had any abnormal   symptoms, such as leaking fluid, bleeding, severe headaches, or abdominal cramping.  If you are using any tobacco products, including cigarettes, chewing tobacco, and electronic cigarettes.  If you have any questions. Other tests or screenings that may be performed during your third trimester include:  Blood tests that check for low iron levels (anemia).  Fetal testing to check the health, activity level, and growth of the fetus. Testing is done if you have certain medical conditions or if there are problems during the pregnancy.  Nonstress test  (NST). This test checks the health of your baby to make sure there are no signs of problems, such as the baby not getting enough oxygen. During this test, a belt is placed around your belly. The baby is made to move, and its heart rate is monitored during movement. What is false labor? False labor is a condition in which you feel small, irregular tightenings of the muscles in the womb (contractions) that usually go away with rest, changing position, or drinking water. These are called Braxton Hicks contractions. Contractions may last for hours, days, or even weeks before true labor sets in. If contractions come at regular intervals, become more frequent, increase in intensity, or become painful, you should see your health care provider. What are the signs of labor?  Abdominal cramps.  Regular contractions that start at 10 minutes apart and become stronger and more frequent with time.  Contractions that start on the top of the uterus and spread down to the lower abdomen and back.  Increased pelvic pressure and dull back pain.  A watery or bloody mucus discharge that comes from the vagina.  Leaking of amniotic fluid. This is also known as your "water breaking." It could be a slow trickle or a gush. Let your health care provider know if it has a color or strange odor. If you have any of these signs, call your health care provider right away, even if it is before your due date. Follow these instructions at home: Medicines  Follow your health care provider's instructions regarding medicine use. Specific medicines may be either safe or unsafe to take during pregnancy.  Take a prenatal vitamin that contains at least 600 micrograms (mcg) of folic acid.  If you develop constipation, try taking a stool softener if your health care provider approves. Eating and drinking   Eat a balanced diet that includes fresh fruits and vegetables, whole grains, good sources of protein such as meat, eggs, or tofu,  and low-fat dairy. Your health care provider will help you determine the amount of weight gain that is right for you.  Avoid raw meat and uncooked cheese. These carry germs that can cause birth defects in the baby.  If you have low calcium intake from food, talk to your health care provider about whether you should take a daily calcium supplement.  Eat four or five small meals rather than three large meals a day.  Limit foods that are high in fat and processed sugars, such as fried and sweet foods.  To prevent constipation: ? Drink enough fluid to keep your urine clear or pale yellow. ? Eat foods that are high in fiber, such as fresh fruits and vegetables, whole grains, and beans. Activity  Exercise only as directed by your health care provider. Most women can continue their usual exercise routine during pregnancy. Try to exercise for 30 minutes at least 5 days a week. Stop exercising if you experience uterine contractions.  Avoid heavy lifting.  Do   not exercise in extreme heat or humidity, or at high altitudes.  Wear low-heel, comfortable shoes.  Practice good posture.  You may continue to have sex unless your health care provider tells you otherwise. Relieving pain and discomfort  Take frequent breaks and rest with your legs elevated if you have leg cramps or low back pain.  Take warm sitz baths to soothe any pain or discomfort caused by hemorrhoids. Use hemorrhoid cream if your health care provider approves.  Wear a good support bra to prevent discomfort from breast tenderness.  If you develop varicose veins: ? Wear support pantyhose or compression stockings as told by your healthcare provider. ? Elevate your feet for 15 minutes, 3-4 times a day. Prenatal care  Write down your questions. Take them to your prenatal visits.  Keep all your prenatal visits as told by your health care provider. This is important. Safety  Wear your seat belt at all times when driving.  Make  a list of emergency phone numbers, including numbers for family, friends, the hospital, and police and fire departments. General instructions  Avoid cat litter boxes and soil used by cats. These carry germs that can cause birth defects in the baby. If you have a cat, ask someone to clean the litter box for you.  Do not travel far distances unless it is absolutely necessary and only with the approval of your health care provider.  Do not use hot tubs, steam rooms, or saunas.  Do not drink alcohol.  Do not use any products that contain nicotine or tobacco, such as cigarettes and e-cigarettes. If you need help quitting, ask your health care provider.  Do not use any medicinal herbs or unprescribed drugs. These chemicals affect the formation and growth of the baby.  Do not douche or use tampons or scented sanitary pads.  Do not cross your legs for long periods of time.  To prepare for the arrival of your baby: ? Take prenatal classes to understand, practice, and ask questions about labor and delivery. ? Make a trial run to the hospital. ? Visit the hospital and tour the maternity area. ? Arrange for maternity or paternity leave through employers. ? Arrange for family and friends to take care of pets while you are in the hospital. ? Purchase a rear-facing car seat and make sure you know how to install it in your car. ? Pack your hospital bag. ? Prepare the baby's nursery. Make sure to remove all pillows and stuffed animals from the baby's crib to prevent suffocation.  Visit your dentist if you have not gone during your pregnancy. Use a soft toothbrush to brush your teeth and be gentle when you floss. Contact a health care provider if:  You are unsure if you are in labor or if your water has broken.  You become dizzy.  You have mild pelvic cramps, pelvic pressure, or nagging pain in your abdominal area.  You have lower back pain.  You have persistent nausea, vomiting, or  diarrhea.  You have an unusual or bad smelling vaginal discharge.  You have pain when you urinate. Get help right away if:  Your water breaks before 37 weeks.  You have regular contractions less than 5 minutes apart before 37 weeks.  You have a fever.  You are leaking fluid from your vagina.  You have spotting or bleeding from your vagina.  You have severe abdominal pain or cramping.  You have rapid weight loss or weight gain.  You have   shortness of breath with chest pain.  You notice sudden or extreme swelling of your face, hands, ankles, feet, or legs.  Your baby makes fewer than 10 movements in 2 hours.  You have severe headaches that do not go away when you take medicine.  You have vision changes. Summary  The third trimester is from week 28 through week 40, months 7 through 9. The third trimester is a time when the unborn baby (fetus) is growing rapidly.  During the third trimester, your discomfort may increase as you and your baby continue to gain weight. You may have abdominal, leg, and back pain, sleeping problems, and an increased need to urinate.  During the third trimester your breasts will keep growing and they will continue to become tender. A yellow fluid (colostrum) may leak from your breasts. This is the first milk you are producing for your baby.  False labor is a condition in which you feel small, irregular tightenings of the muscles in the womb (contractions) that eventually go away. These are called Braxton Hicks contractions. Contractions may last for hours, days, or even weeks before true labor sets in.  Signs of labor can include: abdominal cramps; regular contractions that start at 10 minutes apart and become stronger and more frequent with time; watery or bloody mucus discharge that comes from the vagina; increased pelvic pressure and dull back pain; and leaking of amniotic fluid. This information is not intended to replace advice given to you by your  health care provider. Make sure you discuss any questions you have with your health care provider. Document Released: 08/18/2001 Document Revised: 09/29/2016 Document Reviewed: 09/29/2016 [employer] Interactive Patient Education  2019 [employer]  

## 2018-09-05 NOTE — Progress Notes (Deleted)
[facility]  PATIENT: Loretta Padilla DOB: 06/30/1986   REASON FOR VISIT: *** HISTORY FROM:    HISTORY OF PRESENT ILLNESS: Loretta Carrillois a 32 year old female, accompanied by her boyfriend Loretta CanalesSteve, seen in refer by her primary care doctor Loretta Padilla, Loretta Padilla, for evaluation of seizure, initial evaluation was October 8th 2018.  I reviewed and summarized the referring note, she had a past medical history of depression, epilepsy, is taking Topamax 100 mg, 2 tablets twice a day, She recently [place] from [place] to [place] in June 2018,  She was previously under the care of local neurologist Dr.Richard Sharee Padilla. [place], MD (Phone: [phone]; Fax: [phone]; Locations [facility]- [phone] [address] [phone] [address], [address] [place], [place] [id number]).  She reported a history of epilepsy since 32 years old, she used to have generalized tonic-clonic seizure, tried different medications, has been on stable dose of Topamax 100 mg 2 tablets twice a day for many years, but because of frequent recurrent seizures, eventually had VNS placement in December 2014, she reported mild improvement with the VNS, post surgical one year, she has not had generalized seizure, but began to have recurrent smaller spells, staring into the space, unresponsive for few minutes, she began to have increased spells again 2017, now having a spell on a monthly basis, rarely generalized tonic-clonic seizures,  Recent adjustment for her VNS was in June 2018, she noticed wearing off every 5 minutes, with mild left neck pain,   I reviewed the laboratory evaluation in September 2018: Normal CMP, CBC, hemoglobin of 13.2  UPDATE Nov 04 2017:YY She is now taking topamax 100mg  2 tab bid, onfi 20mg  qhs since Oct 2018,   She was having "silenct seizure' when she runs out of herTopamax 400 mg daily, she has dizziness, lightheadedness, staring off into space, confused, lasting for  few second, no GTC events.Thosesmall seizure-like spells can happen multiple times a day. Add ononfi20 mg every night has made a difference, she no longer has recurrent generalized tonic-clonic seizure, before that, she was having it on a monthly basis, She is also taking Prozac for depression, and other simple medication from her psychiatrist office, complains of chronic insomnia, Today we also performed a VNS interrogation, adjustment, she complains of could not tolerate magnet stimulation  Update June 4th 2019:  She has children at age 32, 3010, 405, she was taking on topamax while 32 years old has GI issue, 13, health, 5 years ferile seizure.  Last peroid was on May 1, 2nd.  Before that was March 24, 25, 26th.  Home preganncy test was positive  She has not has grandmal seizure, for a long times,     UPDATE Sept 19 2019:YY She had one seizure on May 24, 2018, feel it coming on, felt dizzy, spinning sensation, loss of consciousness bed, heard her left foot, she is currently [redacted] weeks pregnant, is going to have a boy, due date is on Oct 10 2018.  She continue have smaller spells, staring, unresponsiveness, taking Topamax 100 mg 2 tablets twice a day," more seizure medications did not make any difference"  She could not tolerate strong VNS settings, complains of throat pain  REVIEW OF SYSTEMS: Full 14 system review of systems performed and notable only for those listed, all others are neg:  Constitutional: neg  Cardiovascular: neg Ear/Nose/Throat: neg  Skin: neg Eyes: neg Respiratory: neg Gastroitestinal: neg  Hematology/Lymphatic: neg  Endocrine: neg Musculoskeletal:neg Allergy/Immunology: neg Neurological: neg Psychiatric: neg Sleep : neg  ALLERGIES: Allergies  Allergen Reactions  . Adhesive [Tape]     Birth control patch and nicoderm patch   . Carbatrol [Carbamazepine] Rash  . Dilantin [Phenytoin Sodium Extended] Rash  . Tizanidine Hcl Rash  .  Venlafaxine Rash    HOME MEDICATIONS: Outpatient Medications Prior to Visit  Medication Sig Dispense Refill  . cyclobenzaprine (FLEXERIL) 10 MG tablet Take 1 tablet (10 mg total) by mouth 3 (three) times daily as needed for muscle spasms. 45 tablet 0  . Doxylamine-Pyridoxine 10-10 MG TBEC 2 PO qhs; may take 1po in am and 1po in afternoon prn nausea 120 tablet 3  . FLUoxetine (PROZAC) 20 MG capsule Take 1 capsule (20 mg total) by mouth daily. 30 capsule 2  . fluticasone (FLONASE) 50 MCG/ACT nasal spray Place 1 spray into both nostrils daily. 16 g 0  . hydrOXYzine (ATARAX/VISTARIL) 25 MG tablet Take 1 tablet (25 mg total) by mouth 2 (two) times daily as needed for anxiety or nausea. 60 tablet 2  . oxymetazoline (AFRIN) 0.05 % nasal spray Place 1 spray into both nostrils 2 (two) times daily as needed for congestion.     . Prenatal Vit-Fe Fumarate-FA (MULTIVITAMIN-PRENATAL) 27-0.8 MG TABS tablet Take 1 tablet by mouth daily at 12 noon.    . promethazine (PHENERGAN) 25 MG tablet Take 1 tablet (25 mg total) by mouth every 6 (six) hours as needed. 20 tablet 1  . topiramate (TOPAMAX) 100 MG tablet 3 tabs in the morning and 3 tabs at night 540 tablet 3  . zolpidem (AMBIEN) 10 MG tablet Take 1 tablet (10 mg total) by mouth at bedtime as needed for sleep. 30 tablet 2   No facility-administered medications prior to visit.     PAST MEDICAL HISTORY: Past Medical History:  Diagnosis Date  . Anxiety   . Back pain   . Depression   . Kidney stones   . Seizures (HCC)     PAST SURGICAL HISTORY: Past Surgical History:  Procedure Laterality Date  . IMPLANTATION VAGAL NERVE STIMULATOR    . VSD REPAIR      FAMILY HISTORY: Family History  Problem Relation Age of Onset  . Bipolar disorder Mother   . Alcohol abuse Mother   . Depression Sister   . Anxiety disorder Sister   . Alcohol abuse Maternal Grandfather   . Diabetes Maternal Grandfather   . Heart attack Maternal Grandfather   . Alcohol abuse  Paternal Grandfather   . Heart disease Paternal Grandfather   . Other Father        unsure of history  . Asthma Son   . Other Son        EOE  . Colon cancer Maternal Grandmother     SOCIAL HISTORY: Social History   Socioeconomic History  . Marital status: Divorced    Spouse name: Not on file  . Number of children: 3  . Years of education: 3 years college  . Highest education level: Not on file  Occupational History  . Occupation: Disabled  Social Needs  . Financial resource strain: Not on file  . Food insecurity:    Worry: Not on file    Inability: Not on file  . Transportation needs:    Medical: Not on file    Non-medical: Not on file  Tobacco Use  . Smoking status: Former Smoker    Packs/day: 0.25  . Smokeless tobacco: Never Used  Substance and Sexual Activity  . Alcohol use: Not Currently  Comment: occ., 05-07-2017 per pt 2-3 times a mth  . Drug use: No  . Sexual activity: Yes    Birth control/protection: None  Lifestyle  . Physical activity:    Days per week: Not on file    Minutes per session: Not on file  . Stress: Not on file  Relationships  . Social connections:    Talks on phone: Not on file    Gets together: Not on file    Attends religious service: Not on file    Active member of club or organization: Not on file    Attends meetings of clubs or organizations: Not on file    Relationship status: Not on file  . Intimate partner violence:    Fear of current or ex partner: Not on file    Emotionally abused: Not on file    Physically abused: Not on file    Forced sexual activity: Not on file  Other Topics Concern  . Not on file  Social History Narrative   Lives at home with boyfriend and children.   Right-handed.   Occasional use of caffeine.        PHYSICAL EXAM  There were no vitals filed for this visit. There is no height or weight on file to calculate BMI.  Generalized: Well developed, in no acute distress  Head: normocephalic and  atraumatic,. Oropharynx benign  Neck: Supple, no carotid bruits  Cardiac: Regular rate rhythm, no murmur  Musculoskeletal: No deformity   Neurological examination   Mentation: Alert oriented to time, place, history taking. Attention span and concentration appropriate. Recent and remote memory intact.  Follows all commands speech and language fluent.   Cranial nerve II-XII: Fundoscopic exam reveals sharp disc margins.Pupils were equal round reactive to light extraocular movements were full, visual field were full on confrontational test. Facial sensation and strength were normal. hearing was intact to finger rubbing bilaterally. Uvula tongue midline. head turning and shoulder shrug were normal and symmetric.Tongue protrusion into cheek strength was normal. Motor: normal bulk and tone, full strength in the BUE, BLE, fine finger movements normal, no pronator drift. No focal weakness Sensory: normal and symmetric to light touch, pinprick, and  Vibration, proprioception  Coordination: finger-nose-finger, heel-to-shin bilaterally, no dysmetria Reflexes: Brachioradialis 2/2, biceps 2/2, triceps 2/2, patellar 2/2, Achilles 2/2, plantar responses were flexor bilaterally. Gait and Station: Rising up from seated position without assistance, normal stance,  moderate stride, good arm swing, smooth turning, able to perform tiptoe, and heel walking without difficulty. Tandem gait is steady  DIAGNOSTIC DATA (LABS, IMAGING, TESTING) - I reviewed patient records, labs, notes, testing and imaging myself where available.  Lab Results  Component Value Date   WBC 6.8 07/31/2018   HGB 10.6 (L) 07/31/2018   HCT 33.3 (L) 07/31/2018   MCV 95.4 07/31/2018   PLT 216 07/31/2018      Component Value Date/Time   NA 134 (L) 07/31/2018 1611   K 3.2 (L) 07/31/2018 1611   CL 107 07/31/2018 1611   CO2 19 (L) 07/31/2018 1611   GLUCOSE 91 07/31/2018 1611   BUN 12 07/31/2018 1611   CREATININE 0.74 07/31/2018 1611    CALCIUM 8.5 (L) 07/31/2018 1611   PROT 6.2 (L) 07/31/2018 1611   ALBUMIN 3.0 (L) 07/31/2018 1611   AST 13 (L) 07/31/2018 1611   ALT 9 07/31/2018 1611   ALKPHOS 52 07/31/2018 1611   BILITOT 0.4 07/31/2018 1611   GFRNONAA >60 07/31/2018 1611   GFRAA >60 07/31/2018 1611  No results found for: CHOL, HDL, LDLCALC, LDLDIRECT, TRIG, CHOLHDL No results found for: BJYN8GHGBA1C No results found for: VITAMINB12 No results found for: TSH  ***  ASSESSMENT AND PLAN  32 y.o. year old female  has a past medical history of Anxiety, Back pain, Depression, Kidney stones, and Seizures (HCC). here with ***  32 year old female, Epilepsy             Most recent seizure was on May 24, 2018             Currently [redacted] weeks pregnant, due date was on October 10, 2018             Increase Topamax to 100 2 tablets in the morning, 3 tablets at nighttime,             Check Topamax level   VNS:  Model Demipulse 103, serial [id number], Implant time 2013-08-21.  Generator Serial No. [id number]  Normal OutPut Current: 1.25 mA Signal Frequency: 20 Hz  Pulse Width:  130  sec Signal on time: 30 seconds Signal off time: 1.8  minute     Magnetic Output Current: 1.5 mA Pulse Width: 130 sec Signal On Time:  60 Sec Diagnostic Impedance: 3328  OHMS  l confirmed the VNS settings. The VNS stimulator was interrogated and reprogrammed as above setting ([id number])     Nilda RiggsNancy Carolyn Canton Yearby, [facility]NP, [facility], APRN  [facility] [address], [address] [place], [place] [address] [phone]

## 2018-09-07 NOTE — L&D Delivery Note (Signed)
Patient is a 33 y.o. now [id number] s/p NSVD at [redacted]w[redacted]d, who was admitted for IOL for NRNST, BPP 6/10 & Polyhydramnios.  She progressed with/without augmentation to complete and pushed for 5 mins to deliver.  Cord clamping delayed by 60 seconds then clamped by CNM and cut by MGF. NICU called for delivery. NICU in room after delivery. Infant taken immediately to NICU team awaiting at the infant warmer. Narcan administered to infant despite no recent narcotics administered to mother prior to delivery. Placenta intact and spontaneous, bleeding moderate.  No laceration identified.  Mom stable prior to transfer to postpartum. She plans on breastfeeding. She requests BTL for birth control; consent signed 08/16/2018.  Delivery Note At 12:56 AM a viable female was delivered via Vaginal, Spontaneous (Presentation: ROA).  APGAR: 1/6/9; weight pending.   Placenta status: spontaneous, intact via Tomasa Blase.  Cord: 3VC with no complications.  Cord pH: not done  Anesthesia: Epidural  Episiotomy: None  Lacerations: None Suture Repair: N/A Est. Blood Loss (mL):   Mom to postpartum.  Baby to Couplet care / Skin to Skin.   Raelyn Mora, MSN, CNM 10/10/18, 1:16 AM

## 2018-09-08 ENCOUNTER — Telehealth: Payer: Self-pay | Admitting: *Deleted

## 2018-09-08 ENCOUNTER — Ambulatory Visit: Payer: Medicare Other | Admitting: Nurse Practitioner

## 2018-09-08 NOTE — Telephone Encounter (Signed)
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  I offered pt 09-28-18 at 0800 with MM/NP.  She is doing ok.  Is due to have her 4th child.  Due date is 10-10-18, she is having labor pains.  Pt stated was cutting it close.

## 2018-09-15 ENCOUNTER — Encounter: Payer: Self-pay | Admitting: Advanced Practice Midwife

## 2018-09-15 ENCOUNTER — Ambulatory Visit (INDEPENDENT_AMBULATORY_CARE_PROVIDER_SITE_OTHER): Payer: Medicare Other | Admitting: Advanced Practice Midwife

## 2018-09-15 VITALS — BP 110/66 | HR 81 | Wt 164.0 lb

## 2018-09-15 DIAGNOSIS — Z3483 Encounter for supervision of other normal pregnancy, third trimester: Secondary | ICD-10-CM

## 2018-09-15 DIAGNOSIS — Z1389 Encounter for screening for other disorder: Secondary | ICD-10-CM

## 2018-09-15 DIAGNOSIS — Z331 Pregnant state, incidental: Secondary | ICD-10-CM

## 2018-09-15 DIAGNOSIS — Z3A36 36 weeks gestation of pregnancy: Secondary | ICD-10-CM

## 2018-09-15 DIAGNOSIS — L299 Pruritus, unspecified: Secondary | ICD-10-CM

## 2018-09-15 LAB — POCT URINALYSIS DIPSTICK OB
Blood, UA: NEGATIVE
Glucose, UA: NEGATIVE
Ketones, UA: NEGATIVE
Leukocytes, UA: NEGATIVE
Nitrite, UA: NEGATIVE
POC,PROTEIN,UA: NEGATIVE

## 2018-09-15 NOTE — Patient Instructions (Addendum)
AM I IN LABOR? What is labor? Labor is the work that your body does to birth your baby. Your uterus (the womb) contracts. Your cervix (the mouth of the uterus) opens. You will push your baby out into the world.  What do contractions (labor pains) feel like? When they first start, contractions usually feel like cramps during your period. Sometimes you feel pain in your back. Most often, contractions feel like muscles pulling painfully in your lower belly. At first, the contractions will probably be 15 to 20 minutes apart. They will not feel too painful. As labor goes on, the contractions get stronger, closer together, and more painful.  How do I time the contractions? Time your contractions by counting the number of minutes from the start of one contraction to the start of the next contraction.  What should I do when the contractions start? If it is night and you can sleep, sleep. If it happens during the day, here are some things you can do to take care of yourself at home: ? Walk. If the pains you are having are real labor, walking will make the contractions come faster and harder. If the contractions are not going to continue and be real labor, walking will make the contractions slow down. ? Take a shower or bath. This will help you relax. ? Eat. Labor is a big event. It takes a lot of energy. ? Drink water. Not drinking enough water can cause false labor (contractions that hurt but do not open your cervix). If this is true labor, drinking water will help you have strength to get through your labor. ? Take a nap. Get all the rest you can. ? Get a massage. If your labor is in your back, a strong massage on your lower back may feel very good. Getting a foot massage is always good. ? Don't panic. You can do this. Your body was made for this. You are strong!  When should I go to the hospital or call my health care provider? ? Your contractions have been 5 minutes apart or less for at least 1  hour. ? If several contractions are so painful you cannot walk or talk during one. ? Your bag of waters breaks. (You may have a big gush of water or just water that runs down your legs when you walk.)  Are there other reasons to call my health care provider? Yes, you should call your health care provider or go to the hospital if you start to bleed like you are having a period- blood that soaks your underwear or runs down your legs, if you have sudden severe pain, if your baby has not moved for several hours, or if you are leaking green fluid. The rule is as follows: If you are very concerned about something, call.    Cervical Ripening: May try one or both  Red Raspberry Leaf capsules:  two 300mg or 400mg tablets with each meal, 2-3 times a day  Potential Side Effects Of Raspberry Leaf:  Most women do not experience any side effects from drinking raspberry leaf tea. However, nausea and loose stools are possible     Evening Primrose Oil capsules: may take 1 to 3 capsules daily. May also prick one to release the oil and insert it into your vagina at night.  Some of the potential side effects:  Upset stomach  Loose stools or diarrhea  Headaches  Nausea:   ``````````````````````````````` 

## 2018-09-15 NOTE — Progress Notes (Addendum)
  [id number] [redacted]w[redacted]d Estimated Date of Delivery: 10/10/18  Blood pressure 110/66, pulse 81, weight 164 lb (74.4 kg), last menstrual period 12/28/2017.   BP weight and urine results all reviewed and noted.  Please refer to the obstetrical flow sheet for the fundal height and fetal heart rate documentation:  Patient reports good fetal movement, denies any bleeding and no rupture of membranes symptoms or regular contractions. Patient has always been induced early d/t "uncontrolled seizures."  Last reported seizure 06/20/18.  Discussed that at this time, IOL is not indicated.  All questions were answered.   Physical Assessment:   Vitals:   09/15/18 1515  BP: 110/66  Pulse: 81  Weight: 164 lb (74.4 kg)  Body mass index is 27.29 kg/m.        Physical Examination:   General appearance: Well appearing, and in no distress  Mental status: Alert, oriented to person, place, and time  Skin: Warm & dry  Cardiovascular: Normal heart rate noted  Respiratory: Normal respiratory effort, no distress  Abdomen: Soft, gravid, nontender  Pelvic: deferred        Extremities:    Fetal Status:     Movement: Present    Results for orders placed or performed in visit on 09/15/18 (from the past 24 hour(s))  POC Urinalysis Dipstick OB   Collection Time: 09/15/18  3:17 PM  Result Value Ref Range   Color, UA     Clarity, UA     Glucose, UA Negative Negative   Bilirubin, UA     Ketones, UA neg    Spec Grav, UA     Blood, UA neg    pH, UA     POC,PROTEIN,UA Negative Negative, Trace, Small (1+), Moderate (2+), Large (3+), 4+   Urobilinogen, UA     Nitrite, UA neg    Leukocytes, UA Negative Negative   Appearance     Odor       Orders Placed This Encounter  Procedures  . POC Urinalysis Dipstick OB    Plan:  Continued routine obstetrical care, Topomax; offered membrane stripping >39 weeks if cx favorable.  Return in about 1 week (around 09/22/2018) for LROB.

## 2018-09-22 ENCOUNTER — Ambulatory Visit (INDEPENDENT_AMBULATORY_CARE_PROVIDER_SITE_OTHER): Payer: Medicare Other | Admitting: Family Medicine

## 2018-09-22 VITALS — BP 116/70 | HR 77 | Wt 165.2 lb

## 2018-09-22 DIAGNOSIS — Z3483 Encounter for supervision of other normal pregnancy, third trimester: Secondary | ICD-10-CM

## 2018-09-22 DIAGNOSIS — Z331 Pregnant state, incidental: Secondary | ICD-10-CM

## 2018-09-22 DIAGNOSIS — Z3A37 37 weeks gestation of pregnancy: Secondary | ICD-10-CM

## 2018-09-22 DIAGNOSIS — Z1389 Encounter for screening for other disorder: Secondary | ICD-10-CM

## 2018-09-22 LAB — POCT URINALYSIS DIPSTICK OB
Blood, UA: NEGATIVE
Glucose, UA: NEGATIVE
Ketones, UA: NEGATIVE
Leukocytes, UA: NEGATIVE
Nitrite, UA: NEGATIVE
POC,PROTEIN,UA: NEGATIVE

## 2018-09-22 LAB — OB RESULTS CONSOLE GBS: GBS: NEGATIVE

## 2018-09-25 LAB — GC/CHLAMYDIA PROBE AMP
Chlamydia trachomatis, NAA: NEGATIVE
Neisseria gonorrhoeae by PCR: NEGATIVE

## 2018-09-25 NOTE — Progress Notes (Signed)
    PRENATAL VISIT NOTE  Subjective:  Loretta Padilla is a 33 y.o. [phone] at [redacted]w[redacted]d being seen today for ongoing prenatal care.  She is currently monitored for the following issues for this high-risk pregnancy and has Depression; Smoker; Epilepsy (HCC); Supervision of normal pregnancy; Insomnia; and Encounter for other contraceptive management on their problem list.  Patient reports no complaints.  Contractions: Irregular. Vag. Bleeding: None.  Movement: Present. Denies leaking of fluid.   The following portions of the patient's history were reviewed and updated as appropriate: allergies, current medications, past family history, past medical history, past social history, past surgical history and problem list. Problem list updated.  Objective:   Vitals:   09/22/18 1135  BP: 116/70  Pulse: 77  Weight: 165 lb 3.2 oz (74.9 kg)    Fetal Status: Fetal Heart Rate (bpm): 135 Fundal Height: 37 cm Movement: Present  Presentation: Vertex  General:  Alert, oriented and cooperative. Patient is in no acute distress.  Skin: Skin is warm and dry. No rash noted.   Cardiovascular: Normal heart rate noted  Respiratory: Normal respiratory effort, no problems with respiration noted  Abdomen: Soft, gravid, appropriate for gestational age.  Pain/Pressure: Present     Pelvic: Cervical exam deferred        Extremities: Normal range of motion.  Edema: Trace  Mental Status: Normal mood and affect. Normal behavior. Normal judgment and thought content.   Assessment and Plan:  Pregnancy: [id number] at [redacted]w[redacted]d  1. Pregnant state, incidental - POC Urinalysis Dipstick OB - GC/Chlamydia Probe Amp([facility]) - Culture, beta strep (group b only)  2. Screening for genitourinary condition - POC Urinalysis Dipstick OB  3. Supervision of normal intrauterine pregnancy in multigravida, third trimester Up to date Client strongly desires IOL as soon as possible. Discussed 39 or later IOL.   4. Epilepsy - on topomax,  last seizure in July but also conflicts this and says she is having monthly seizures. She has not seen her neurologist.  - has appt upcoming, await evaluation to determine possible timing of delivery. The patient's seizures have not increased in pregnancy per her report. She says she has "always had a seizure per month"  Term labor symptoms and general obstetric precautions including but not limited to vaginal bleeding, contractions, leaking of fluid and fetal movement were reviewed in detail with the patient. Please refer to After Visit Summary for other counseling recommendations.  Return in about 1 week (around 09/29/2018).  Future Appointments  Date Time Provider Department Center  09/28/2018  8:00 AM Butch Penny, NP [facility] None  09/29/2018  4:15 PM Cheral Marker, CNM [facility]  11/02/2018  1:00 PM Myrlene Broker, MD [facility] None    Federico Flake, MD

## 2018-09-26 LAB — CULTURE, BETA STREP (GROUP B ONLY): Strep Gp B Culture: NEGATIVE

## 2018-09-28 ENCOUNTER — Ambulatory Visit: Payer: Self-pay | Admitting: Adult Health

## 2018-09-28 ENCOUNTER — Telehealth: Payer: Self-pay

## 2018-09-28 NOTE — Telephone Encounter (Signed)
Patient was a no call/no show for their appointment today.   

## 2018-09-29 ENCOUNTER — Ambulatory Visit (INDEPENDENT_AMBULATORY_CARE_PROVIDER_SITE_OTHER): Payer: Medicare Other | Admitting: Advanced Practice Midwife

## 2018-09-29 ENCOUNTER — Encounter: Payer: Self-pay | Admitting: Advanced Practice Midwife

## 2018-09-29 ENCOUNTER — Other Ambulatory Visit: Payer: Self-pay

## 2018-09-29 ENCOUNTER — Encounter: Payer: Self-pay | Admitting: Adult Health

## 2018-09-29 VITALS — BP 115/69 | HR 70 | Wt 165.0 lb

## 2018-09-29 DIAGNOSIS — Z331 Pregnant state, incidental: Secondary | ICD-10-CM

## 2018-09-29 DIAGNOSIS — G40909 Epilepsy, unspecified, not intractable, without status epilepticus: Secondary | ICD-10-CM

## 2018-09-29 DIAGNOSIS — Z3A38 38 weeks gestation of pregnancy: Secondary | ICD-10-CM

## 2018-09-29 DIAGNOSIS — Z1389 Encounter for screening for other disorder: Secondary | ICD-10-CM

## 2018-09-29 DIAGNOSIS — Z3483 Encounter for supervision of other normal pregnancy, third trimester: Secondary | ICD-10-CM

## 2018-09-29 LAB — POCT URINALYSIS DIPSTICK OB
Blood, UA: NEGATIVE
Glucose, UA: NEGATIVE
Ketones, UA: NEGATIVE
Leukocytes, UA: NEGATIVE
Nitrite, UA: NEGATIVE
POC,PROTEIN,UA: NEGATIVE

## 2018-09-29 NOTE — Progress Notes (Addendum)
  [id number] [redacted]w[redacted]d Estimated Date of Delivery: 10/10/18  Blood pressure 115/69, pulse 70, weight 165 lb (74.8 kg), last menstrual period 12/28/2017.   BP weight and urine results all reviewed and noted.  Please refer to the obstetrical flow sheet for the fundal height and fetal heart rate documentation:  Patient reports good fetal movement, denies any bleeding and no rupture of membranes symptoms or regular contractions. Patient is without complaints other than normal pg complaints. No seizures. Was "too tired" to make neuro appt. Wants membranes stripped at 39 weeks All questions were answered.   Physical Assessment:   Vitals:   09/29/18 1622  BP: 115/69  Pulse: 70  Weight: 165 lb (74.8 kg)  Body mass index is 27.46 kg/m.        Physical Examination:   General appearance: Well appearing, and in no distress  Mental status: Alert, oriented to person, place, and time  Skin: Warm & dry  Cardiovascular: Normal heart rate noted  Respiratory: Normal respiratory effort, no distress  Abdomen: Soft, gravid, nontender  Pelvic: Cervical exam performed  Dilation: 2 Effacement (%): 50 Station: -2  Extremities: Edema: Trace  Fetal Status: Fetal Heart Rate (bpm): 140 Fundal Height: 38 cm Movement: Present Presentation: Vertex  Results for orders placed or performed in visit on 09/29/18 (from the past 24 hour(s))  POC Urinalysis Dipstick OB   Collection Time: 09/29/18  4:23 PM  Result Value Ref Range   Color, UA     Clarity, UA     Glucose, UA Negative Negative   Bilirubin, UA     Ketones, UA neg    Spec Grav, UA     Blood, UA neg    pH, UA     POC,PROTEIN,UA Negative Negative, Trace, Small (1+), Moderate (2+), Large (3+), 4+   Urobilinogen, UA     Nitrite, UA neg    Leukocytes, UA Negative Negative   Appearance     Odor       Orders Placed This Encounter  Procedures  . POC Urinalysis Dipstick OB    Plan:  Continued routine obstetrical care, no showed for neuro appt yesterday,  so no indicatoin for early delivery.   Return in about 4 days (around 10/03/2018) for LROB.

## 2018-10-03 ENCOUNTER — Inpatient Hospital Stay (HOSPITAL_COMMUNITY)
Admission: AD | Admit: 2018-10-03 | Discharge: 2018-10-03 | Disposition: A | Discharge status: Home / Self Care | Payer: Medicare Other | Source: Ambulatory Visit | Attending: Obstetrics & Gynecology | Admitting: Obstetrics & Gynecology

## 2018-10-03 ENCOUNTER — Inpatient Hospital Stay (HOSPITAL_COMMUNITY)
Admission: AD | Admit: 2018-10-03 | Discharge: 2018-10-04 | Disposition: A | Discharge status: Home / Self Care | Payer: Medicare Other | Source: Ambulatory Visit | Attending: Obstetrics & Gynecology | Admitting: Obstetrics & Gynecology

## 2018-10-03 ENCOUNTER — Ambulatory Visit (INDEPENDENT_AMBULATORY_CARE_PROVIDER_SITE_OTHER): Payer: Medicare Other | Admitting: Obstetrics & Gynecology

## 2018-10-03 ENCOUNTER — Encounter (HOSPITAL_COMMUNITY): Payer: Self-pay

## 2018-10-03 ENCOUNTER — Other Ambulatory Visit: Payer: Self-pay

## 2018-10-03 VITALS — BP 106/70 | HR 73 | Wt 165.0 lb

## 2018-10-03 DIAGNOSIS — O471 False labor at or after 37 completed weeks of gestation: Secondary | ICD-10-CM | POA: Insufficient documentation

## 2018-10-03 DIAGNOSIS — Z3A39 39 weeks gestation of pregnancy: Secondary | ICD-10-CM

## 2018-10-03 DIAGNOSIS — O479 False labor, unspecified: Secondary | ICD-10-CM

## 2018-10-03 DIAGNOSIS — Z3483 Encounter for supervision of other normal pregnancy, third trimester: Secondary | ICD-10-CM

## 2018-10-03 NOTE — Progress Notes (Signed)
   LOW-RISK PREGNANCY VISIT Patient name: Loretta Padilla MRN [id number]  Date of birth: 16-Jun-1986 Chief Complaint:   Routine Prenatal Visit  History of Present Illness:   Loretta Padilla is a 33 y.o. [id number] female at [redacted]w[redacted]d with an Estimated Date of Delivery: 10/10/18 being seen today for ongoing management of a low-risk pregnancy.  Today she reports contractions went to MAU and false labor. Contractions: Irregular. Vag. Bleeding: None.  Movement: Present. denies leaking of fluid. Review of Systems:   Pertinent items are noted in HPI Denies abnormal vaginal discharge w/ itching/odor/irritation, headaches, visual changes, shortness of breath, chest pain, abdominal pain, severe nausea/vomiting, or problems with urination or bowel movements unless otherwise stated above. Pertinent History Reviewed:  Reviewed past medical,surgical, social, obstetrical and family history.  Reviewed problem list, medications and allergies. Physical Assessment:   Vitals:   10/03/18 1053  BP: 106/70  Pulse: 73  Weight: 165 lb (74.8 kg)  Body mass index is 27.46 kg/m.        Physical Examination:   General appearance: Well appearing, and in no distress  Mental status: Alert, oriented to person, place, and time  Skin: Warm & dry  Cardiovascular: Normal heart rate noted  Respiratory: Normal respiratory effort, no distress  Abdomen: Soft, gravid, nontender  Pelvic: Cervical exam performed       3/th/-3  Extremities: Edema: Trace  Fetal Status:     Movement: Present    No results found for this or any previous visit (from the past 24 hour(s)).  Assessment & Plan:  1) Low-risk pregnancy [id number] at [redacted]w[redacted]d with an Estimated Date of Delivery: 10/10/18   2) Early vs false,    Meds: No orders of the defined types were placed in this encounter.  Labs/procedures today:   Plan:  Continue routine obstetrical care   Reviewed: Term labor symptoms and general obstetric precautions including but not limited to  vaginal bleeding, contractions, leaking of fluid and fetal movement were reviewed in detail with the patient.  All questions were answered  Follow-up: Return in about 1 week (around 10/10/2018) for LROB.  No orders of the defined types were placed in this encounter.  Lazaro Arms  10/03/2018 11:36 AM

## 2018-10-03 NOTE — Discharge Instructions (Signed)

## 2018-10-03 NOTE — MAU Note (Signed)
Pt states she has been having some ctx and pressure since 0300 this morning.  Pt denies LOF or vag. Bleeding.  Pt rates pain 4/10.

## 2018-10-03 NOTE — Progress Notes (Signed)
RN labor evaluation. Cervix unchanged. Reactive NST. Vital signs reviewed.   NST:  Baseline: 130 bpm, Variability: Good {> 6 bpm), Accelerations: Reactive and Decelerations: Absent   Loretta Maddison, NP   

## 2018-10-03 NOTE — MAU Note (Signed)
Pt reports contractions all weekend but worsened after having membranes stripped this afternoon. Denies LOF or vaginal bleeding. Reports that she has not felt her baby move since this afternoon. Cervix was 3cm earlier today

## 2018-10-04 DIAGNOSIS — Z3A39 39 weeks gestation of pregnancy: Secondary | ICD-10-CM | POA: Diagnosis not present

## 2018-10-04 DIAGNOSIS — O471 False labor at or after 37 completed weeks of gestation: Secondary | ICD-10-CM | POA: Diagnosis not present

## 2018-10-04 NOTE — MAU Provider Note (Signed)
RN Labor Eval  33yo [id number] at [redacted]w[redacted]d who presented to MAU for uterine contractions. Monitored for about 90 minutes without any cervical change, head ballotable and cervix posterior. Reactive tracing, category I strip - 140s/mod/+a/-d; contractions every 5-10 minutes. Denies vaginal bleeding, leakage of fluids. Reports normal fetal movement. Has follow-up appointment next week for routine prenatal care. Labor precautions reviewed. Not in active labor and stable for discharge.   Loretta Padilla. Earlene Plater, DO OB/GYN Fellow

## 2018-10-04 NOTE — MAU Note (Signed)
I have communicated with Rhett Bannister, DO and reviewed vital signs:  Vitals:   10/03/18 2233  BP: 111/72  Pulse: 79  Resp: 19  Temp: 98.3 F (36.8 C)    Vaginal exam:  Dilation: 3 Effacement (%): 50 Cervical Position: Posterior Presentation: Undeterminable Exam by:: A Makilah Dowda, RN,   Also reviewed contraction pattern and that non-stress test is reactive.  It has been documented that patient is contracting every 5-10 minutes with no cervical change over 1.5 hours not indicating active labor.  Patient denies any other complaints.  Based on this report provider has given order for discharge.  A discharge order and diagnosis entered by a provider.   Labor discharge instructions reviewed with patient.

## 2018-10-08 ENCOUNTER — Encounter (HOSPITAL_COMMUNITY): Payer: Self-pay | Admitting: *Deleted

## 2018-10-08 ENCOUNTER — Inpatient Hospital Stay (HOSPITAL_COMMUNITY)
Admission: AD | Admit: 2018-10-08 | Discharge: 2018-10-12 | DRG: 797 | Disposition: A | Discharge status: Home / Self Care | Payer: Medicare Other | Attending: Obstetrics & Gynecology | Admitting: Obstetrics & Gynecology

## 2018-10-08 DIAGNOSIS — Z87891 Personal history of nicotine dependence: Secondary | ICD-10-CM

## 2018-10-08 DIAGNOSIS — Z3490 Encounter for supervision of normal pregnancy, unspecified, unspecified trimester: Secondary | ICD-10-CM | POA: Diagnosis present

## 2018-10-08 DIAGNOSIS — O99354 Diseases of the nervous system complicating childbirth: Secondary | ICD-10-CM | POA: Diagnosis present

## 2018-10-08 DIAGNOSIS — Z302 Encounter for sterilization: Secondary | ICD-10-CM

## 2018-10-08 DIAGNOSIS — O99344 Other mental disorders complicating childbirth: Secondary | ICD-10-CM | POA: Diagnosis present

## 2018-10-08 DIAGNOSIS — F329 Major depressive disorder, single episode, unspecified: Secondary | ICD-10-CM | POA: Diagnosis present

## 2018-10-08 DIAGNOSIS — Z3483 Encounter for supervision of other normal pregnancy, third trimester: Secondary | ICD-10-CM

## 2018-10-08 DIAGNOSIS — G40909 Epilepsy, unspecified, not intractable, without status epilepticus: Secondary | ICD-10-CM | POA: Diagnosis present

## 2018-10-08 DIAGNOSIS — Z3009 Encounter for other general counseling and advice on contraception: Secondary | ICD-10-CM

## 2018-10-08 DIAGNOSIS — O403XX0 Polyhydramnios, third trimester, not applicable or unspecified: Principal | ICD-10-CM | POA: Diagnosis present

## 2018-10-08 DIAGNOSIS — Z3A39 39 weeks gestation of pregnancy: Secondary | ICD-10-CM

## 2018-10-08 DIAGNOSIS — O99824 Streptococcus B carrier state complicating childbirth: Secondary | ICD-10-CM | POA: Diagnosis present

## 2018-10-09 ENCOUNTER — Inpatient Hospital Stay (HOSPITAL_COMMUNITY): Payer: Medicare Other | Admitting: Anesthesiology

## 2018-10-09 ENCOUNTER — Other Ambulatory Visit: Payer: Self-pay

## 2018-10-09 ENCOUNTER — Inpatient Hospital Stay (HOSPITAL_BASED_OUTPATIENT_CLINIC_OR_DEPARTMENT_OTHER): Payer: Medicare Other

## 2018-10-09 ENCOUNTER — Encounter (HOSPITAL_COMMUNITY): Payer: Self-pay | Admitting: *Deleted

## 2018-10-09 DIAGNOSIS — O99334 Smoking (tobacco) complicating childbirth: Secondary | ICD-10-CM | POA: Diagnosis not present

## 2018-10-09 DIAGNOSIS — O368330 Maternal care for abnormalities of the fetal heart rate or rhythm, third trimester, not applicable or unspecified: Secondary | ICD-10-CM

## 2018-10-09 DIAGNOSIS — Z3A39 39 weeks gestation of pregnancy: Secondary | ICD-10-CM

## 2018-10-09 DIAGNOSIS — O9989 Other specified diseases and conditions complicating pregnancy, childbirth and the puerperium: Secondary | ICD-10-CM | POA: Diagnosis not present

## 2018-10-09 DIAGNOSIS — O403XX0 Polyhydramnios, third trimester, not applicable or unspecified: Secondary | ICD-10-CM | POA: Diagnosis not present

## 2018-10-09 DIAGNOSIS — Z3A40 40 weeks gestation of pregnancy: Secondary | ICD-10-CM | POA: Diagnosis not present

## 2018-10-09 DIAGNOSIS — Z3490 Encounter for supervision of normal pregnancy, unspecified, unspecified trimester: Secondary | ICD-10-CM | POA: Diagnosis present

## 2018-10-09 DIAGNOSIS — O99354 Diseases of the nervous system complicating childbirth: Secondary | ICD-10-CM | POA: Diagnosis not present

## 2018-10-09 DIAGNOSIS — O99824 Streptococcus B carrier state complicating childbirth: Secondary | ICD-10-CM | POA: Diagnosis not present

## 2018-10-09 DIAGNOSIS — G40909 Epilepsy, unspecified, not intractable, without status epilepticus: Secondary | ICD-10-CM | POA: Diagnosis not present

## 2018-10-09 DIAGNOSIS — O99344 Other mental disorders complicating childbirth: Secondary | ICD-10-CM | POA: Diagnosis not present

## 2018-10-09 DIAGNOSIS — F17210 Nicotine dependence, cigarettes, uncomplicated: Secondary | ICD-10-CM | POA: Diagnosis not present

## 2018-10-09 DIAGNOSIS — Z3009 Encounter for other general counseling and advice on contraception: Secondary | ICD-10-CM

## 2018-10-09 DIAGNOSIS — Z302 Encounter for sterilization: Secondary | ICD-10-CM | POA: Diagnosis not present

## 2018-10-09 DIAGNOSIS — F329 Major depressive disorder, single episode, unspecified: Secondary | ICD-10-CM | POA: Diagnosis not present

## 2018-10-09 DIAGNOSIS — Z87891 Personal history of nicotine dependence: Secondary | ICD-10-CM | POA: Diagnosis not present

## 2018-10-09 LAB — CBC
HCT: 29.8 % — ABNORMAL LOW (ref 36.0–46.0)
Hemoglobin: 9.6 g/dL — ABNORMAL LOW (ref 12.0–15.0)
MCH: 28.9 pg (ref 26.0–34.0)
MCHC: 32.2 g/dL (ref 30.0–36.0)
MCV: 89.8 fL (ref 80.0–100.0)
Platelets: 180 10*3/uL (ref 150–400)
RBC: 3.32 MIL/uL — ABNORMAL LOW (ref 3.87–5.11)
RDW: 13.8 % (ref 11.5–15.5)
WBC: 6.2 10*3/uL (ref 4.0–10.5)
nRBC: 0 % (ref 0.0–0.2)

## 2018-10-09 LAB — RPR: RPR Ser Ql: NONREACTIVE

## 2018-10-09 LAB — ABO/RH: ABO/RH(D): A POS

## 2018-10-09 LAB — TYPE AND SCREEN
ABO/RH(D): A POS
Antibody Screen: NEGATIVE

## 2018-10-09 MED ORDER — LACTATED RINGERS IV SOLN
500.0000 mL | INTRAVENOUS | Status: DC | PRN
  Administered 2018-10-09: 500 mL via INTRAVENOUS

## 2018-10-09 MED ORDER — FENTANYL 2.5 MCG/ML BUPIVACAINE 1/10 % EPIDURAL INFUSION (WH - ANES): 14.0000 mL/h | INTRAMUSCULAR | Status: DC | PRN

## 2018-10-09 MED ORDER — TERBUTALINE SULFATE 1 MG/ML IJ SOLN
0.2500 mg | Freq: Once | INTRAMUSCULAR | Status: DC | PRN
  Filled 2018-10-09: qty 1

## 2018-10-09 MED ORDER — ACETAMINOPHEN 325 MG PO TABS
650.0000 mg | ORAL_TABLET | ORAL | Status: DC | PRN
  Administered 2018-10-10: 650 mg via ORAL
  Filled 2018-10-09: qty 2

## 2018-10-09 MED ORDER — PHENYLEPHRINE 40 MCG/ML (10ML) SYRINGE FOR IV PUSH (FOR BLOOD PRESSURE SUPPORT): 80.0000 ug | PREFILLED_SYRINGE | INTRAVENOUS | Status: DC | PRN

## 2018-10-09 MED ORDER — PHENYLEPHRINE 40 MCG/ML (10ML) SYRINGE FOR IV PUSH (FOR BLOOD PRESSURE SUPPORT)
80.0000 ug | PREFILLED_SYRINGE | INTRAVENOUS | Status: DC | PRN
  Filled 2018-10-09: qty 10

## 2018-10-09 MED ORDER — EPHEDRINE 5 MG/ML INJ
10.0000 mg | INTRAVENOUS | Status: DC | PRN
  Filled 2018-10-09: qty 2

## 2018-10-09 MED ORDER — EPHEDRINE 5 MG/ML INJ: 10.0000 mg | INTRAVENOUS | Status: DC | PRN

## 2018-10-09 MED ORDER — TOPIRAMATE 100 MG PO TABS
200.0000 mg | ORAL_TABLET | Freq: Two times a day (BID) | ORAL | Status: DC
  Administered 2018-10-10: 200 mg via ORAL
  Filled 2018-10-09 (×3): qty 2

## 2018-10-09 MED ORDER — OXYTOCIN 40 UNITS IN NORMAL SALINE INFUSION - SIMPLE MED
1.0000 m[IU]/min | INTRAVENOUS | Status: DC
  Administered 2018-10-09: 2 m[IU]/min via INTRAVENOUS
  Administered 2018-10-10: 8 m[IU]/min via INTRAVENOUS
  Filled 2018-10-09: qty 1000

## 2018-10-09 MED ORDER — SOD CITRATE-CITRIC ACID 500-334 MG/5ML PO SOLN: 30.0000 mL | ORAL | Status: DC | PRN

## 2018-10-09 MED ORDER — ONDANSETRON HCL 4 MG/2ML IJ SOLN: 4.0000 mg | Freq: Four times a day (QID) | INTRAMUSCULAR | Status: DC | PRN

## 2018-10-09 MED ORDER — LACTATED RINGERS IV SOLN
INTRAVENOUS | Status: DC
  Administered 2018-10-09 – 2018-10-10 (×4): via INTRAVENOUS

## 2018-10-09 MED ORDER — FLUOXETINE HCL 20 MG PO CAPS
20.0000 mg | ORAL_CAPSULE | Freq: Every day | ORAL | Status: DC
  Administered 2018-10-09: 20 mg via ORAL
  Filled 2018-10-09 (×2): qty 1

## 2018-10-09 MED ORDER — MISOPROSTOL 50MCG HALF TABLET
50.0000 ug | ORAL_TABLET | ORAL | Status: DC
  Administered 2018-10-09 (×2): 50 ug via ORAL
  Filled 2018-10-09 (×2): qty 1

## 2018-10-09 MED ORDER — FENTANYL 2.5 MCG/ML BUPIVACAINE 1/10 % EPIDURAL INFUSION (WH - ANES)
14.0000 mL/h | INTRAMUSCULAR | Status: DC | PRN
  Administered 2018-10-09: 14 mL/h via EPIDURAL
  Filled 2018-10-09: qty 100

## 2018-10-09 MED ORDER — OXYTOCIN 40 UNITS IN NORMAL SALINE INFUSION - SIMPLE MED
2.5000 [IU]/h | INTRAVENOUS | Status: DC
  Administered 2018-10-10: 2.5 [IU]/h via INTRAVENOUS

## 2018-10-09 MED ORDER — LACTATED RINGERS IV SOLN
500.0000 mL | Freq: Once | INTRAVENOUS | Status: AC
  Administered 2018-10-09: 500 mL via INTRAVENOUS

## 2018-10-09 MED ORDER — PHENYLEPHRINE 40 MCG/ML (10ML) SYRINGE FOR IV PUSH (FOR BLOOD PRESSURE SUPPORT)
80.0000 ug | PREFILLED_SYRINGE | INTRAVENOUS | Status: DC | PRN
  Filled 2018-10-09 (×2): qty 10

## 2018-10-09 MED ORDER — LIDOCAINE HCL (PF) 1 % IJ SOLN
30.0000 mL | INTRAMUSCULAR | Status: DC | PRN
  Filled 2018-10-09: qty 30

## 2018-10-09 MED ORDER — FENTANYL CITRATE (PF) 100 MCG/2ML IJ SOLN
100.0000 ug | INTRAMUSCULAR | Status: DC | PRN
  Administered 2018-10-09 (×4): 100 ug via INTRAVENOUS
  Filled 2018-10-09 (×4): qty 2

## 2018-10-09 MED ORDER — LIDOCAINE HCL (PF) 1 % IJ SOLN
INTRAMUSCULAR | Status: DC | PRN
  Administered 2018-10-09: 5 mL via EPIDURAL

## 2018-10-09 MED ORDER — LACTATED RINGERS IV BOLUS
1000.0000 mL | Freq: Once | INTRAVENOUS | Status: AC
  Administered 2018-10-09: 1000 mL via INTRAVENOUS

## 2018-10-09 MED ORDER — LACTATED RINGERS IV SOLN: 500.0000 mL | Freq: Once | INTRAVENOUS | Status: DC

## 2018-10-09 MED ORDER — DIPHENHYDRAMINE HCL 50 MG/ML IJ SOLN: 12.5000 mg | INTRAMUSCULAR | Status: DC | PRN

## 2018-10-09 MED ORDER — OXYTOCIN BOLUS FROM INFUSION
500.0000 mL | Freq: Once | INTRAVENOUS | Status: AC
  Administered 2018-10-10: 500 mL via INTRAVENOUS

## 2018-10-09 NOTE — Progress Notes (Signed)
  Subjective: Loretta Padilla is a 33 y.o. [id number] at [redacted]w[redacted]d by ultrasound admitted for induction of labor due to Non-reactive NST and polyhydramnios.  Objective: BP 108/63   Pulse (!) 58   Temp 98.6 F (37 C) (Oral)   Resp 16   Ht 5\' 5"  (1.651 m)   Wt 76.9 kg   LMP 12/28/2017 (Exact Date)   SpO2 98%   BMI 28.21 kg/m  No intake/output data recorded. Total I/O In: -  Out: 250 [Urine:250]  FHT:  FHR: 150 bpm, variability: moderate,  accelerations:  Present,  decelerations:  Absent UC:   regular, every 1.5-3 minutes SVE:   Dilation: 5.5 Effacement (%): 70 Station: -2 Exam by: Raelyn Mora, CNM AROM with moderate amount of meconium stained fluid in return  Pitocin: 16 mU/min  Labs: Lab Results  Component Value Date   WBC 6.2 10/09/2018   HGB 9.6 (L) 10/09/2018   HCT 29.8 (L) 10/09/2018   MCV 89.8 10/09/2018   PLT 180 10/09/2018    Assessment / Plan: Induction of labor due to non-reassuring fetal testing and polyhydrmanios,  progressing well on pitocin  Labor: Progressing normally on Pitocin Preeclampsia:  n/a Fetal Wellbeing:  Category I Pain Control:  Epidural I/D:  n/a Anticipated MOD:  NSVD  Raelyn Mora, CNM 10/09/2018, 11:35 PM

## 2018-10-09 NOTE — Anesthesia Pain Management Evaluation Note (Signed)
  CRNA Pain Management Visit Note  Patient: Loretta Padilla, 33 y.o., female  "Hello I am a member of the anesthesia team at [facility]. We have an anesthesia team available at all times to provide care throughout the hospital, including epidural management and anesthesia for C-section. I don't know your plan for the delivery whether it a natural birth, water birth, IV sedation, nitrous supplementation, doula or epidural, but we want to meet your pain goals."   1.Was your pain managed to your expectations on prior hospitalizations?   Yes   2.What is your expectation for pain management during this hospitalization?     Epidural and IV pain meds  3.How can we help you reach that goal? Epidural and IV pain meds  Record the patient's initial score and the patient's pain goal.   Pain: 2  Pain Goal: 8 [facility] wants you to be able to say your pain was always managed very well.  Rica Records 10/09/2018

## 2018-10-09 NOTE — Anesthesia Procedure Notes (Signed)
Epidural Patient location during procedure: OB Start time: 10/09/2018 7:34 PM End time: 10/09/2018 7:49 PM  Staffing Anesthesiologist: Trevor Iha, MD Performed: anesthesiologist   Preanesthetic Checklist Completed: patient identified, site marked, surgical consent, pre-op evaluation, timeout performed, IV checked, risks and benefits discussed and monitors and equipment checked  Epidural Patient position: sitting Prep: site prepped and draped and DuraPrep Patient monitoring: continuous pulse ox and blood pressure Approach: midline Location: L3-L4 Injection technique: LOR air  Needle:  Needle type: Tuohy  Needle gauge: 17 G Needle length: 9 cm and 9 Needle insertion depth: 6 cm Catheter type: closed end flexible Catheter size: 19 Gauge Catheter at skin depth: 11 cm Test dose: negative  Assessment Events: blood not aspirated, injection not painful, no injection resistance, negative IV test and no paresthesia  Additional Notes Patient identified. Risks/Benefits/Options discussed with patient including but not limited to bleeding, infection, nerve damage, paralysis, failed block, incomplete pain control, headache, blood pressure changes, nausea, vomiting, reactions to medication both or allergic, itching and postpartum back pain. Confirmed with bedside nurse the patient's most recent platelet count. Confirmed with patient that they are not currently taking any anticoagulation, have any bleeding history or any family history of bleeding disorders. Patient expressed understanding and wished to proceed. All questions were answered. Sterile technique was used throughout the entire procedure. Please see nursing notes for vital signs. Test dose was given through epidural needle and negative prior to continuing to dose epidural or start infusion. Warning signs of high block given to the patient including shortness of breath, tingling/numbness in hands, complete motor block, or any concerning  symptoms with instructions to call for help. Patient was given instructions on fall risk and not to get out of bed. All questions and concerns addressed with instructions to call with any issues. 1 Attempt (S) . Patient tolerated procedure well.

## 2018-10-09 NOTE — H&P (Signed)
LABOR AND DELIVERY ADMISSION HISTORY AND PHYSICAL NOTE  Loretta Padilla is a 33 y.o. female [phone] with IUP at [redacted]w[redacted]d by 8w [place] presenting for contractions.  During evaluation in MAU, patient had nonreactive FHT.  Sent for BPP, which was 6/10 and  Polyhydramnios (AFI 38).  She reports positive fetal movement. She denies leakage of fluid or vaginal bleeding.  Prenatal History/Complications: PNC at [facility] Pregnancy complications:  - Epilepsy - Tobacco use in pregnancy - Polyhydramnios  Past Medical History: Past Medical History:  Diagnosis Date  . Anxiety   . Back pain   . Depression   . Kidney stones   . Seizures (HCC)     Past Surgical History: Past Surgical History:  Procedure Laterality Date  . IMPLANTATION VAGAL NERVE STIMULATOR    . VSD REPAIR      Obstetrical History: OB History    Gravida  8   Para  3   Term  3   Preterm      AB  4   Living  3     SAB  2   TAB  2   Ectopic      Multiple      Live Births  3           Social History: Social History   Socioeconomic History  . Marital status: Divorced    Spouse name: Not on file  . Number of children: 3  . Years of education: 3 years college  . Highest education level: Not on file  Occupational History  . Occupation: Disabled  Social Needs  . Financial resource strain: Not on file  . Food insecurity:    Worry: Not on file    Inability: Not on file  . Transportation needs:    Medical: Not on file    Non-medical: Not on file  Tobacco Use  . Smoking status: Former Smoker    Packs/day: 0.25  . Smokeless tobacco: Never Used  Substance and Sexual Activity  . Alcohol use: Not Currently    Comment: occ., 05-07-2017 per pt 2-3 times a mth  . Drug use: No  . Sexual activity: Yes    Birth control/protection: None  Lifestyle  . Physical activity:    Days per week: Not on file    Minutes per session: Not on file  . Stress: Not on file  Relationships  . Social connections:    Talks on  phone: Not on file    Gets together: Not on file    Attends religious service: Not on file    Active member of club or organization: Not on file    Attends meetings of clubs or organizations: Not on file    Relationship status: Not on file  Other Topics Concern  . Not on file  Social History Narrative   Lives at home with boyfriend and children.   Right-handed.   Occasional use of caffeine.       Family History: Family History  Problem Relation Age of Onset  . Bipolar disorder Mother   . Alcohol abuse Mother   . Depression Sister   . Anxiety disorder Sister   . Alcohol abuse Maternal Grandfather   . Diabetes Maternal Grandfather   . Heart attack Maternal Grandfather   . Alcohol abuse Paternal Grandfather   . Heart disease Paternal Grandfather   . Other Father        unsure of history  . Asthma Son   . Other Son  EOE  . Colon cancer Maternal Grandmother     Allergies: Allergies  Allergen Reactions  . Adhesive [Tape]     Birth control patch and nicoderm patch   . Carbatrol [Carbamazepine] Rash  . Dilantin [Phenytoin Sodium Extended] Rash  . Tizanidine Hcl Rash  . Venlafaxine Rash    Medications Prior to Admission  Medication Sig Dispense Refill Last Dose  . cyclobenzaprine (FLEXERIL) 10 MG tablet Take 1 tablet (10 mg total) by mouth 3 (three) times daily as needed for muscle spasms. 45 tablet 0 Taking  . FLUoxetine (PROZAC) 20 MG capsule Take 1 capsule (20 mg total) by mouth daily. 30 capsule 2 10/03/2018 at Unknown time  . fluticasone (FLONASE) 50 MCG/ACT nasal spray Place 1 spray into both nostrils daily. 16 g 0 Taking  . oxymetazoline (AFRIN) 0.05 % nasal spray Place 1 spray into both nostrils 2 (two) times daily as needed for congestion.    Taking  . Prenatal Vit-Fe Fumarate-FA (MULTIVITAMIN-PRENATAL) 27-0.8 MG TABS tablet Take 1 tablet by mouth daily at 12 noon.   Taking  . topiramate (TOPAMAX) 100 MG tablet 3 tabs in the morning and 3 tabs at night 540  tablet 3 10/03/2018 at Unknown time     Review of Systems  All systems reviewed and negative except as stated in HPI  Physical Exam Blood pressure 110/66, pulse 74, temperature 98.5 F (36.9 C), temperature source Oral, resp. rate 17, height 5\' 5"  (1.651 m), weight 76.9 kg, last menstrual period 12/28/2017, SpO2 100 %. General appearance: alert, oriented, NAD Lungs: normal respiratory effort Heart: regular rate Abdomen: soft, non-tender; gravid, FH appropriate for GA Extremities: No calf swelling or tenderness Presentation: cephalic Fetal monitoring: 125/mod var/+ accels/no decels Uterine activity: regular q3-56m Dilation: 3.5 Effacement (%): 50 Station: -2 Exam by:: K.Wilson,RN  Prenatal labs: ABO, Rh: A/Positive/-- (07/09 1625) Antibody: Negative (11/05 0910) Rubella: 4.24 (07/09 1625) RPR: Non Reactive (11/05 0910)  HBsAg: Negative (07/09 1625)  HIV: Non Reactive (11/05 0910)  GC/Chlamydia: negative GBS:   negative 2-hr GTT: 85/176/123 Genetic screening:  declined Anatomy US: normal  Prenatal Transfer Tool  Maternal Diabetes: No Genetic Screening: Declined Maternal Ultrasounds/Referrals: Normal Fetal Ultrasounds or other Referrals:  None Maternal Substance Abuse:  Yes:  Type: Smoker Significant Maternal Medications:  Meds include: Other:  Topamax Significant Maternal Lab Results: None  No results found for this or any previous visit (from the past 24 hour(s)).  Patient Active Problem List   Diagnosis Date Noted  . Encounter for other contraceptive management 04/25/2018  . Supervision of normal pregnancy 03/15/2018  . Insomnia 03/15/2018  . Epilepsy (HCC) 11/23/2017  . Smoker 08/03/2017  . Depression 05/07/2017    Assessment: Loretta Padilla is a 33 y.o. [id number] at [redacted]w[redacted]d here for IOL for polyhdramnios and 6/10 BPP  #Labor: cervix not quite favorable. Will give a dose of cytotec to start and evaluate in 4 hours #Pain: Desires epidural #FWB: Cat 1 #ID:   GBS neg #MOF: bottle #MOC: BTL #Circ:  outpatient  Loretta Abbot, MD  10/09/2018, 2:53 AM

## 2018-10-09 NOTE — Progress Notes (Signed)
OB/GYN Faculty Practice: Labor Progress Note  Subjective: Desires epidural. Feeling more uncomfortable now that pitocin gong.   Objective: BP (!) 104/53   Pulse 62   Temp 98.5 F (36.9 C) (Oral)   Resp 18   Ht 5\' 5"  (1.651 m)   Wt 76.9 kg   LMP 12/28/2017 (Exact Date)   SpO2 100%   BMI 28.21 kg/m  Gen: sitting up in bed, mildly uncomfortable appearing Dilation: 5 Effacement (%): 60 Cervical Position: Middle(pt left) Station: -2 Presentation: Vertex Exam by:: A. Tuttle, RNC   Assessment and Plan: 33 y.o. [id number] [redacted]w[redacted]d here for IOL for NRNST, BPP 6/10, polyhydramnios (AFI 38).   Labor: Some change since last check. Switched to pitocin to continue with induction. Will continue to titrate per protocol. Defer AROM until after epidural placement, especially in setting of known polyhydramnios. -- pain control: planning epidural now  -- PPH Risk: low  Fetal Well-Being: EFW 7-8lbs by Leopold's, exam limited by fluid. Cephalic by sutures.  -- Category I - continuous fetal monitoring  -- GBS positive    Loretta Padilla S. Earlene Plater, DO OB/GYN Fellow, Faculty Practice  7:07 PM

## 2018-10-09 NOTE — Progress Notes (Signed)
OB/GYN Faculty Practice: Labor Progress Note  Subjective: States doing okay, still having lots of pain. Contractions just hurt, says intensity depends mostly on position.    Objective: BP 110/64   Pulse 68   Temp 98.8 F (37.1 C) (Oral)   Resp 18   Ht 5\' 5"  (1.651 m)   Wt 76.9 kg   LMP 12/28/2017 (Exact Date)   SpO2 100%   BMI 28.21 kg/m  Gen: tired appearing Dilation: 4.5 Effacement (%): 60 Cervical Position: Middle(left) Station: -2 Presentation: Vertex Exam by:: A. Tuttle, RNC   Assessment and Plan: 33 y.o. [id number] [redacted]w[redacted]d here for IOL for NRNST, BPP 6/10, polyhydramnios (AFI 38).   Labor: FB out since last check. Delayed getting 2nd dose of cytotec because of contraction frequency. Just given 2nd dose and will likely switch to pit with next check.  -- pain control: desires epidural  -- PPH Risk: low  Fetal Well-Being: EFW 7-8lbs by Leopold's, exam limited by fluid. Cephalic by sutures.  -- Category I - continuous fetal monitoring  -- GBS positive    Loretta Buchberger S. Earlene Plater, DO OB/GYN Fellow, Faculty Practice  2:36 PM

## 2018-10-09 NOTE — Anesthesia Preprocedure Evaluation (Signed)
Anesthesia Evaluation  Patient identified by MRN, date of birth, ID band Patient awake    Reviewed: Allergy & Precautions, NPO status , Patient's Chart, lab work & pertinent test results  Airway Mallampati: II  TM Distance: >3 FB Neck ROM: Full    Dental no notable dental hx. (+) Teeth Intact   Pulmonary former smoker,    Pulmonary exam normal breath sounds clear to auscultation       Cardiovascular Exercise Tolerance: Good negative cardio ROS Normal cardiovascular exam Rhythm:Regular Rate:Normal     Neuro/Psych Seizures -,  Anxiety    GI/Hepatic   Endo/Other  negative endocrine ROS  Renal/GU negative Renal ROS     Musculoskeletal   Abdominal   Peds  Hematology Hgb 9.6 Plts 180   Anesthesia Other Findings   Reproductive/Obstetrics (+) Pregnancy Pt w polyhydramnis                             Anesthesia Physical Anesthesia Plan  ASA: II  Anesthesia Plan: Epidural   Post-op Pain Management:    Induction:   PONV Risk Score and Plan: Treatment may vary due to age or medical condition  Airway Management Planned:   Additional Equipment:   Intra-op Plan:   Post-operative Plan:   Informed Consent: I have reviewed the patients History and Physical, chart, labs and discussed the procedure including the risks, benefits and alternatives for the proposed anesthesia with the patient or authorized representative who has indicated his/her understanding and acceptance.       Plan Discussed with:   Anesthesia Plan Comments:         Anesthesia Quick Evaluation

## 2018-10-09 NOTE — Progress Notes (Signed)
OB/GYN Faculty Practice: Labor Progress Note  Subjective: Doing well, not feeling too uncomfortable during contractions. Mother and father of baby are in room.   Objective: BP 114/62   Pulse 62   Temp 98.2 F (36.8 C) (Oral)   Resp 18   Ht 5\' 5"  (1.651 m)   Wt 76.9 kg   LMP 12/28/2017 (Exact Date)   SpO2 100%   BMI 28.21 kg/m  Gen: comfortable appearing  Dilation: 3 Effacement (%): Thick Cervical Position: Posterior Station: -3 Presentation: Vertex Exam by:: Dr. Earlene Plater  Assessment and Plan: 33 y.o. [id number] [redacted]w[redacted]d here for IOL for NRNST, BPP 6/10, polyhydramnios (AFI 38).   Labor: Induction started earlier this morning with one dose of cytotec. On recheck, cervix still thick and more 2.5-3, so FB placed. Will give additional dose of cytotec.  -- pain control: desires epidural  -- PPH Risk: low  Fetal Well-Being: EFW 7-8lbs by Leopold's, exam limited by fluid. Cephalic by sutures.  -- Category I - continuous fetal monitoring  -- GBS positive    Dhruvan Gullion S. Earlene Plater, DO OB/GYN Fellow, Faculty Practice  10:59 AM

## 2018-10-09 NOTE — Progress Notes (Signed)
Patient ID: Loretta Padilla, female   DOB: 04/29/1986, 33 y.o.   MRN: [id number] Back to room in response to emergency call bell alert. Patient with O2 via NRFM, in RT lateral position.  NST - FHR: 140 bpm / minimal variability / accels pabsent / prolonged decel down 60-90 bpm x 6.5 mins / TOCO: regular every 1.5-3 mins.  Gradual return to baseline with moderate variability and accels present  Pitocin d/c'd by RN  *Consult with Dr. Erin Fulling @ [phone] - notified of AROM, meconium stained fluid, prolonged decel, and pitocin d/c'd; recommended tx plan: insert IUPC and restart pitocin in 15-20 mins  Loretta Padilla, CNM  10/10/2018 11:58 PM

## 2018-10-09 NOTE — Progress Notes (Addendum)
S: Doing well, pain well-controlled with epidural in place. Some pelvic pressure with each contraction. Patient requesting that baby be born today, because she "doesn't want him born on the anniversary of my grandfather's death. I don't want to celebrate and mourn at the same time."   O: Vitals:   10/09/18 2016 10/09/18 2031 10/09/18 2101 10/09/18 2131  BP: 108/64 (!) 96/53 (!) 98/58 99/68  Pulse: 60 60 64 60  Resp: 16 16 16 16   Temp:      TempSrc:      SpO2: 98%     Weight:      Height:         FHT:  FHR: 145 bpm, variability: minimal ,  accelerations:  Present,  decelerations:  Absent UC:   irregular, every 1-3 minutes SVE:   Dilation: 5.5 Effacement (%): 70 Station: -2 Exam by:: Laney Pastor, RN Pitocin on 8 mU --> RN states she "doesn't want to go up on the pitocin right no, because of the frequent UC's"  A / P: Induction of labor due to non-reassuring fetal testing and polyhydramnios,  progressing well on pitocin  Fetal Wellbeing:  Category I Pain Control:  Epidural  Anticipated MOD:  NSVD  Discussed with patient, patient's mother and RN that AROM and IUPC placement is in the plan, but would like to get more regular and stronger UC's before AROM. Explained the risks involved with AROM and polyhydramnios. Patient verbalized an understanding of the plan of care and agrees. RN to continue titrating Pitocin as FP MD and CNM in house and readily available. Will re-evaluate for AROM in 2 hrs.  Raelyn Mora, MSN, CNM 10/09/2018, 9:40 PM

## 2018-10-10 ENCOUNTER — Inpatient Hospital Stay (HOSPITAL_COMMUNITY): Payer: Medicare Other | Admitting: Anesthesiology

## 2018-10-10 ENCOUNTER — Telehealth: Payer: Self-pay | Admitting: *Deleted

## 2018-10-10 ENCOUNTER — Encounter (HOSPITAL_COMMUNITY): Payer: Self-pay

## 2018-10-10 ENCOUNTER — Encounter: Payer: Medicare Other | Admitting: Obstetrics & Gynecology

## 2018-10-10 ENCOUNTER — Encounter (HOSPITAL_COMMUNITY)
Admission: AD | Disposition: A | Discharge status: Home / Self Care | Payer: Self-pay | Source: Home / Self Care | Attending: Obstetrics & Gynecology

## 2018-10-10 DIAGNOSIS — O403XX0 Polyhydramnios, third trimester, not applicable or unspecified: Secondary | ICD-10-CM

## 2018-10-10 DIAGNOSIS — F17210 Nicotine dependence, cigarettes, uncomplicated: Secondary | ICD-10-CM

## 2018-10-10 DIAGNOSIS — O99354 Diseases of the nervous system complicating childbirth: Secondary | ICD-10-CM

## 2018-10-10 DIAGNOSIS — O99334 Smoking (tobacco) complicating childbirth: Secondary | ICD-10-CM

## 2018-10-10 DIAGNOSIS — Z3A39 39 weeks gestation of pregnancy: Secondary | ICD-10-CM

## 2018-10-10 HISTORY — PX: TUBAL LIGATION: SHX77

## 2018-10-10 SURGERY — LIGATION, FALLOPIAN TUBE, POSTPARTUM
Anesthesia: Epidural | Wound class: Clean

## 2018-10-10 MED ORDER — HYDROMORPHONE HCL 1 MG/ML IJ SOLN: 1.0000 mg | INTRAMUSCULAR | Status: DC | PRN

## 2018-10-10 MED ORDER — MIDAZOLAM HCL 2 MG/2ML IJ SOLN
INTRAMUSCULAR | Status: AC
  Filled 2018-10-10: qty 2

## 2018-10-10 MED ORDER — WITCH HAZEL-GLYCERIN EX PADS: 1.0000 "application " | MEDICATED_PAD | CUTANEOUS | Status: DC | PRN

## 2018-10-10 MED ORDER — ONDANSETRON HCL 4 MG/2ML IJ SOLN
4.0000 mg | INTRAMUSCULAR | Status: DC | PRN
  Administered 2018-10-10: 4 mg via INTRAVENOUS

## 2018-10-10 MED ORDER — TETANUS-DIPHTH-ACELL PERTUSSIS 5-2.5-18.5 LF-MCG/0.5 IM SUSP: 0.5000 mL | Freq: Once | INTRAMUSCULAR | Status: DC

## 2018-10-10 MED ORDER — OXYCODONE HCL 5 MG PO TABS
5.0000 mg | ORAL_TABLET | Freq: Four times a day (QID) | ORAL | Status: DC | PRN
  Administered 2018-10-10 – 2018-10-11 (×2): 5 mg via ORAL
  Filled 2018-10-10 (×3): qty 1

## 2018-10-10 MED ORDER — ZOLPIDEM TARTRATE 5 MG PO TABS: 5.0000 mg | ORAL_TABLET | Freq: Every evening | ORAL | Status: DC | PRN

## 2018-10-10 MED ORDER — MEPERIDINE HCL 25 MG/ML IJ SOLN: 6.2500 mg | INTRAMUSCULAR | Status: DC | PRN

## 2018-10-10 MED ORDER — METOCLOPRAMIDE HCL 10 MG PO TABS
10.0000 mg | ORAL_TABLET | Freq: Once | ORAL | Status: AC
  Administered 2018-10-10: 10 mg via ORAL
  Filled 2018-10-10: qty 1

## 2018-10-10 MED ORDER — BENZOCAINE-MENTHOL 20-0.5 % EX AERO
1.0000 "application " | INHALATION_SPRAY | CUTANEOUS | Status: DC | PRN
  Administered 2018-10-10: 1 via TOPICAL
  Filled 2018-10-10 (×2): qty 56

## 2018-10-10 MED ORDER — PROPOFOL 10 MG/ML IV BOLUS
INTRAVENOUS | Status: DC | PRN
  Administered 2018-10-10: 20 mg via INTRAVENOUS

## 2018-10-10 MED ORDER — NALOXONE HCL 0.4 MG/ML IJ SOLN
INTRAMUSCULAR | Status: AC
  Filled 2018-10-10: qty 1

## 2018-10-10 MED ORDER — FAMOTIDINE 20 MG PO TABS
40.0000 mg | ORAL_TABLET | Freq: Once | ORAL | Status: AC
  Administered 2018-10-10: 40 mg via ORAL
  Filled 2018-10-10: qty 2

## 2018-10-10 MED ORDER — ONDANSETRON HCL 4 MG/2ML IJ SOLN: 4.0000 mg | Freq: Once | INTRAMUSCULAR | Status: DC | PRN

## 2018-10-10 MED ORDER — ACETAMINOPHEN 325 MG PO TABS: 325.0000 mg | ORAL_TABLET | ORAL | Status: DC | PRN

## 2018-10-10 MED ORDER — ONDANSETRON HCL 4 MG/2ML IJ SOLN
INTRAMUSCULAR | Status: AC
  Filled 2018-10-10: qty 2

## 2018-10-10 MED ORDER — DIPHENHYDRAMINE HCL 25 MG PO CAPS: 25.0000 mg | ORAL_CAPSULE | Freq: Four times a day (QID) | ORAL | Status: DC | PRN

## 2018-10-10 MED ORDER — ACETAMINOPHEN 160 MG/5ML PO SOLN: 325.0000 mg | ORAL | Status: DC | PRN

## 2018-10-10 MED ORDER — TOPIRAMATE 100 MG PO TABS
200.0000 mg | ORAL_TABLET | Freq: Two times a day (BID) | ORAL | Status: DC
  Administered 2018-10-10 – 2018-10-12 (×5): 200 mg via ORAL
  Filled 2018-10-10 (×3): qty 2

## 2018-10-10 MED ORDER — IBUPROFEN 600 MG PO TABS
600.0000 mg | ORAL_TABLET | Freq: Four times a day (QID) | ORAL | Status: DC
  Administered 2018-10-10 – 2018-10-12 (×9): 600 mg via ORAL
  Filled 2018-10-10 (×9): qty 1

## 2018-10-10 MED ORDER — FENTANYL CITRATE (PF) 100 MCG/2ML IJ SOLN: 25.0000 ug | INTRAMUSCULAR | Status: DC | PRN

## 2018-10-10 MED ORDER — COCONUT OIL OIL: 1.0000 "application " | TOPICAL_OIL | Status: DC | PRN

## 2018-10-10 MED ORDER — MISOPROSTOL 200 MCG PO TABS
ORAL_TABLET | ORAL | Status: AC
  Administered 2018-10-10: 800 ug via RECTAL
  Filled 2018-10-10: qty 4

## 2018-10-10 MED ORDER — OXYCODONE HCL 5 MG PO TABS: 5.0000 mg | ORAL_TABLET | Freq: Once | ORAL | Status: DC | PRN

## 2018-10-10 MED ORDER — MISOPROSTOL 200 MCG PO TABS
800.0000 ug | ORAL_TABLET | Freq: Once | ORAL | Status: AC
  Administered 2018-10-10: 800 ug via RECTAL

## 2018-10-10 MED ORDER — DIBUCAINE 1 % RE OINT: 1.0000 "application " | TOPICAL_OINTMENT | RECTAL | Status: DC | PRN

## 2018-10-10 MED ORDER — FENTANYL CITRATE (PF) 100 MCG/2ML IJ SOLN
INTRAMUSCULAR | Status: AC
  Filled 2018-10-10: qty 2

## 2018-10-10 MED ORDER — LACTATED RINGERS IV SOLN
INTRAVENOUS | Status: DC
  Administered 2018-10-10 (×2): via INTRAVENOUS

## 2018-10-10 MED ORDER — SIMETHICONE 80 MG PO CHEW
80.0000 mg | CHEWABLE_TABLET | ORAL | Status: DC | PRN
  Administered 2018-10-11: 80 mg via ORAL
  Filled 2018-10-10: qty 1

## 2018-10-10 MED ORDER — FENTANYL CITRATE (PF) 100 MCG/2ML IJ SOLN
INTRAMUSCULAR | Status: DC | PRN
  Administered 2018-10-10 (×3): 50 ug via INTRAVENOUS

## 2018-10-10 MED ORDER — PROPOFOL 10 MG/ML IV BOLUS
INTRAVENOUS | Status: AC
  Filled 2018-10-10: qty 20

## 2018-10-10 MED ORDER — FLUOXETINE HCL 20 MG PO CAPS
20.0000 mg | ORAL_CAPSULE | Freq: Every day | ORAL | Status: DC
  Administered 2018-10-10 – 2018-10-12 (×3): 20 mg via ORAL
  Filled 2018-10-10 (×3): qty 1

## 2018-10-10 MED ORDER — OXYCODONE HCL 5 MG/5ML PO SOLN: 5.0000 mg | Freq: Once | ORAL | Status: DC | PRN

## 2018-10-10 MED ORDER — ONDANSETRON HCL 4 MG PO TABS: 4.0000 mg | ORAL_TABLET | ORAL | Status: DC | PRN

## 2018-10-10 MED ORDER — LIDOCAINE HCL (CARDIAC) PF 100 MG/5ML IV SOSY
PREFILLED_SYRINGE | INTRAVENOUS | Status: AC
  Filled 2018-10-10: qty 5

## 2018-10-10 MED ORDER — SENNOSIDES-DOCUSATE SODIUM 8.6-50 MG PO TABS
2.0000 | ORAL_TABLET | ORAL | Status: DC
  Administered 2018-10-10 – 2018-10-11 (×2): 2 via ORAL
  Filled 2018-10-10 (×2): qty 2

## 2018-10-10 MED ORDER — BUPIVACAINE HCL (PF) 0.25 % IJ SOLN
INTRAMUSCULAR | Status: DC | PRN
  Administered 2018-10-10: 5 mL

## 2018-10-10 MED ORDER — SODIUM BICARBONATE 8.4 % IV SOLN
INTRAVENOUS | Status: DC | PRN
  Administered 2018-10-10 (×5): 5 mL via EPIDURAL

## 2018-10-10 MED ORDER — ACETAMINOPHEN 325 MG PO TABS
650.0000 mg | ORAL_TABLET | ORAL | Status: DC | PRN
  Administered 2018-10-10 (×2): 650 mg via ORAL
  Filled 2018-10-10 (×2): qty 2

## 2018-10-10 MED ORDER — BUPIVACAINE HCL (PF) 0.25 % IJ SOLN
INTRAMUSCULAR | Status: AC
  Filled 2018-10-10: qty 10

## 2018-10-10 MED ORDER — PRENATAL MULTIVITAMIN CH
1.0000 | ORAL_TABLET | Freq: Every day | ORAL | Status: DC
  Filled 2018-10-10: qty 1

## 2018-10-10 MED ORDER — OXYCODONE HCL 5 MG PO TABS
10.0000 mg | ORAL_TABLET | Freq: Once | ORAL | Status: AC
  Administered 2018-10-10: 10 mg via ORAL
  Filled 2018-10-10: qty 2

## 2018-10-10 MED ORDER — MIDAZOLAM HCL 2 MG/2ML IJ SOLN
INTRAMUSCULAR | Status: DC | PRN
  Administered 2018-10-10 (×2): 1 mg via INTRAVENOUS

## 2018-10-10 SURGICAL SUPPLY — 24 items
CLIP FILSHIE TUBAL LIGA STRL (Clip) ×4 IMPLANT
CLOTH BEACON ORANGE TIMEOUT ST (SAFETY) ×2 IMPLANT
DERMABOND ADHESIVE PROPEN (GAUZE/BANDAGES/DRESSINGS) ×1
DERMABOND ADVANCED .7 DNX6 (GAUZE/BANDAGES/DRESSINGS) ×1 IMPLANT
DRESSING OPSITE X SMALL 2X3 (GAUZE/BANDAGES/DRESSINGS) ×2 IMPLANT
DRSG OPSITE POSTOP 3X4 (GAUZE/BANDAGES/DRESSINGS) ×2 IMPLANT
DURAPREP 26ML APPLICATOR (WOUND CARE) ×2 IMPLANT
GLOVE BIO SURGEON STRL SZ7.5 (GLOVE) ×2 IMPLANT
GLOVE BIOGEL PI IND STRL 7.0 (GLOVE) ×2 IMPLANT
GLOVE BIOGEL PI INDICATOR 7.0 (GLOVE) ×2
GOWN STRL REUS W/TWL LRG LVL3 (GOWN DISPOSABLE) ×2 IMPLANT
GOWN STRL REUS W/TWL XL LVL3 (GOWN DISPOSABLE) ×2 IMPLANT
NEEDLE HYPO 22GX1.5 SAFETY (NEEDLE) ×2 IMPLANT
NS IRRIG 1000ML POUR BTL (IV SOLUTION) ×2 IMPLANT
PACK ABDOMINAL MINOR (CUSTOM PROCEDURE TRAY) ×2 IMPLANT
PROTECTOR NERVE ULNAR (MISCELLANEOUS) ×2 IMPLANT
SPONGE LAP 4X18 RFD (DISPOSABLE) IMPLANT
SUT MNCRL AB 4-0 PS2 18 (SUTURE) ×2 IMPLANT
SUT PLAIN 0 NONE (SUTURE) ×2 IMPLANT
SUT VIC AB 0 CT1 27 (SUTURE) ×1
SUT VIC AB 0 CT1 27XBRD ANBCTR (SUTURE) ×1 IMPLANT
SYR CONTROL 10ML LL (SYRINGE) ×2 IMPLANT
TOWEL OR 17X24 6PK STRL BLUE (TOWEL DISPOSABLE) ×4 IMPLANT
WATER STERILE IRR 1000ML POUR (IV SOLUTION) ×2 IMPLANT

## 2018-10-10 NOTE — Discharge Summary (Addendum)
OB Discharge Summary  Patient Name: Loretta Padilla DOB: 16-Dec-1985 MRN: [id number]  Date of admission: 10/08/2018 Delivering MD: Raelyn Mora   Date of discharge: 10/12/2018  Admitting diagnosis: 39.5 WKS, CTXS Intrauterine pregnancy: [redacted]w[redacted]d     Secondary diagnosis:  Active Problems:   Encounter for induction of labor   Polyhydramnios affecting pregnancy in third trimester   Unwanted fertility  Additional problems: none     Discharge diagnosis: Term Pregnancy Delivered                                                                                                Post partum procedures:postpartum tubal ligation 10/10/2018  Augmentation: AROM, Pitocin, Cytotec and Foley Balloon  Complications: None  Hospital course:  Induction of Labor With Vaginal Delivery   33 y.o. yo [id number] at [redacted]w[redacted]d was admitted to the hospital 10/08/2018 for induction of labor.  Indication for induction: NRNST and polyhydramnios.  Patient had an uncomplicated labor course as follows: Membrane Rupture Time/Date: 11:28 PM ,10/09/2018   Intrapartum Procedures: Episiotomy: None [1]                                         Lacerations:  None [1]  Patient had delivery of a Viable infant.  Information for the patient's newborn:  Alyna, Mcnaney [[id number]]  Delivery Method: Vaginal, Spontaneous(Filed from Delivery Summary)   10/10/2018  Details of delivery can be found in separate delivery note. She underwent bilateral tubal ligation on 10/10/2018 without complication. Day 1 Postpartum the patient was experiencing sharp pelvic pains spanning from hip to hip with increased tenderness to palpation on the center of the pelvis. Physical exam findings were suggestive of pubic symphysis inflammation, which was treated with tylenol, ibuprofen, oxycodone and dilaudid. She was feeling and ambulating better by the time of discharge. Patient is discharged home 10/12/18.  Physical exam  Vitals:   10/11/18 1700 10/11/18 2142  10/11/18 2222 10/12/18 0952  BP: 119/76 114/63 112/74 132/79  Pulse: 61 66 63 67  Resp: 18 18 18 18   Temp: 97.7 F (36.5 C) 97.9 F (36.6 C) 98.3 F (36.8 C) 98 F (36.7 C)  TempSrc: Oral Oral Oral Oral  SpO2: 99%     Weight:      Height:       General: alert, cooperative and no distress Lochia: appropriate Uterine Fundus: firm Incision: N/A DVT Evaluation: No evidence of DVT seen on physical exam. No significant calf/ankle edema. Labs: Lab Results  Component Value Date   WBC 6.2 10/09/2018   HGB 9.6 (L) 10/09/2018   HCT 29.8 (L) 10/09/2018   MCV 89.8 10/09/2018   PLT 180 10/09/2018   CMP Latest Ref Rng & Units 07/31/2018  Glucose 70 - 99 mg/dL 91  BUN 6 - 20 mg/dL 12  Creatinine 8.03 - 2.12 mg/dL 2.48  Sodium 250 - 037 mmol/L 134(L)  Potassium 3.5 - 5.1 mmol/L 3.2(L)  Chloride 98 - 111 mmol/L 107  CO2 22 - 32 mmol/L 19(L)  Calcium 8.9 - 10.3 mg/dL 4.4(R)  Total Protein 6.5 - 8.1 g/dL 6.2(L)  Total Bilirubin 0.3 - 1.2 mg/dL 0.4  Alkaline Phos 38 - 126 U/L 52  AST 15 - 41 U/L 13(L)  ALT 0 - 44 U/L 9   Discharge instruction: per After Visit Summary and "Baby and Me Booklet".  After visit meds:  Allergies as of 10/12/2018      Reactions   Adhesive [tape]    Birth control patch and nicoderm patch    Carbatrol [carbamazepine] Rash   Dilantin [phenytoin Sodium Extended] Rash   Tizanidine Hcl Rash   Venlafaxine Rash      Medication List    TAKE these medications   cyclobenzaprine 10 MG tablet Commonly known as:  FLEXERIL Take 1 tablet (10 mg total) by mouth 3 (three) times daily as needed for muscle spasms.   FLUoxetine 20 MG capsule Commonly known as:  PROZAC Take 1 capsule (20 mg total) by mouth daily.   fluticasone 50 MCG/ACT nasal spray Commonly known as:  FLONASE Place 1 spray into both nostrils daily.   ibuprofen 600 MG tablet Commonly known as:  ADVIL,MOTRIN Take 1 tablet (600 mg total) by mouth every 6 (six) hours.   multivitamin-prenatal  27-0.8 MG Tabs tablet Take 1 tablet by mouth daily at 12 noon.   oxymetazoline 0.05 % nasal spray Commonly known as:  AFRIN Place 1 spray into both nostrils 2 (two) times daily as needed for congestion.   senna-docusate 8.6-50 MG tablet Commonly known as:  Senokot-S Take 2 tablets by mouth daily. Start taking on:  October 13, 2018   topiramate 100 MG tablet Commonly known as:  TOPAMAX 3 tabs in the morning and 3 tabs at night What changed:    how much to take  when to take this  additional instructions   zolpidem 10 MG tablet Commonly known as:  AMBIEN Take 10 mg by mouth at bedtime as needed for sleep.      Diet: routine diet  Activity: Advance as tolerated. Pelvic rest for 6 weeks.   Outpatient follow up:6 weeks Follow up Appt: Future Appointments  Date Time Provider Department Center  11/02/2018  1:00 PM Myrlene Broker, MD [facility] None  11/14/2018  9:30 AM Cheral Marker, CNM [facility]   Follow up Visit:No follow-ups on file.  Postpartum contraception: Tubal Ligation  Newborn Data: Live born female  Birth Weight: 7 lb 3.5 oz (3274 g) APGAR: 1, 6  Newborn Delivery   Birth date/time:  10/10/2018 00:56:00 Delivery type:  Vaginal, Spontaneous    Baby Feeding: Bottle Disposition:home with mother  10/12/2018 Wilnette Kales, DO  I confirm that I have verified the information documented in the resident's note and that I have also personally reperformed the physical exam and all medical decision making activities.  Patient was seen and examined by me also Agree with note Vitals stable Labs stable Fundus firm, lochia within normal limits Dressing clean and intact Ext WNL  Ready for discharge  Aviva Signs, CNM

## 2018-10-10 NOTE — Telephone Encounter (Signed)
Lmom for pt to call us back to schedule pp appointment.  10-10-2018  AS

## 2018-10-10 NOTE — Progress Notes (Signed)
Was called to this patient's room regarding pelvic pain. Upon palpation the patient was experiencing significant tenderness over the pubic symphysis radiating to bilateral hips. There was anterior rotation of the Left innominate. Osteopathic manipulative therapy was performed on her pelvis and included muscle energy and counter-strain. Patient tolerated this well and had improvement of her pain. To help with additional discomfort produced by movement, patient was given 10mg  percocet to be followed by a 5mg  dose as needed. Patient was also given an ice pack and instructed to use on the area of pain to assist with decreasing inflammation.  Wilnette Kales, DO 7:40 PM 10/10/18

## 2018-10-10 NOTE — Progress Notes (Signed)
Pt c/o of hip pain. Dr made aware of and will be up to see pt.

## 2018-10-10 NOTE — Progress Notes (Signed)
Pt c/o hip pain having difficulty getting up to Bathroom. Dr Dareen Piano made aware. Dr to come see pt now.

## 2018-10-10 NOTE — Anesthesia Postprocedure Evaluation (Signed)
Anesthesia Post Note  Patient: Loretta Padilla  Procedure(s) Performed: AN AD HOC LABOR EPIDURAL     Patient location during evaluation: Mother Baby Anesthesia Type: Epidural Level of consciousness: awake and alert Pain management: pain level controlled Vital Signs Assessment: post-procedure vital signs reviewed and stable Respiratory status: spontaneous breathing, nonlabored ventilation and respiratory function stable Cardiovascular status: stable Postop Assessment: no headache, no backache and epidural receding Anesthetic complications: no    Last Vitals:  Vitals:   10/10/18 0326 10/10/18 0415  BP: 105/76 108/73  Pulse:  (!) 57  Resp: 16 16  Temp: 37.1 C 36.8 C  SpO2: 98% 100%    Last Pain:  Vitals:   10/10/18 0415  TempSrc: Oral  PainSc:    Pain Goal:                   [name],[name]

## 2018-10-10 NOTE — Consult Note (Signed)
Neonatology Note:   Attendance at Delivery:    I was asked by R. Arita Miss, CNM for Dr. Erin Fulling to attend this NSVD at term. The mother is a G8P3A4 A pos, GBS neg with nonreactive FHR tracing, BPP 6/10, polyhydramnios, tobacco use, depression (Prozac), and epilepsy (on Topamax). ROM 1 hour before delivery, fluid with moderate meconium. Delayed cord clamping was done. Our team arrived at 1.25 minutes, at which time the baby was apneic, dusky, and limp, being given stimulation. I performed bulb suctioning for scant yellow fluid, then applied PPV, to which he responded quickly with improved color and normalization of HR. However, his breathing was irregular and he became apneic again almost immediately, so PPV was resumed. I obtained history that mother got 3 doses of IV Fentanyl on the day shift and, although the last dose was about 10 hours ago, they were administered close together, so might be causing his respiratory depression. No history of illicit drug use. We gave a dose of Narcan 1 ml to the left anterior thigh, at 9 minutes of life. He maintained regular respirations and had improved muscle tone after that. We observed him for several minutes and he maintained normal O2 saturations in room air and was undistressed, with normal muscle tone and movement. Ap 1/6/9. Lungs clear to ausc in DR. Infant is able to remain with his mother for skin to skin time under nursing supervision. Transferred to the care of Pediatrician.   Doretha Sou, MD

## 2018-10-10 NOTE — Anesthesia Preprocedure Evaluation (Signed)
Anesthesia Evaluation  Patient identified by MRN, date of birth, ID band Patient awake    Reviewed: Allergy & Precautions, H&P , NPO status , Patient's Chart, lab work & pertinent test results, reviewed documented beta blocker date and time   Airway Mallampati: II  TM Distance: >3 FB Neck ROM: full    Dental no notable dental hx. (+) Teeth Intact   Pulmonary neg pulmonary ROS, former smoker,    Pulmonary exam normal breath sounds clear to auscultation       Cardiovascular Exercise Tolerance: Good negative cardio ROS Normal cardiovascular exam Rhythm:regular Rate:Normal     Neuro/Psych Seizures -, Poorly Controlled,  Anxiety Depression Neuro-stimulator for seizure controll negative psych ROS   GI/Hepatic negative GI ROS, Neg liver ROS,   Endo/Other  negative endocrine ROS  Renal/GU negative Renal ROS  negative genitourinary   Musculoskeletal   Abdominal   Peds  Hematology negative hematology ROS (+) Hgb 9.6 Plts 180   Anesthesia Other Findings   Reproductive/Obstetrics negative OB ROS Pt w polyhydramnis                             Anesthesia Physical  Anesthesia Plan  ASA: III  Anesthesia Plan: Epidural   Post-op Pain Management:    Induction:   PONV Risk Score and Plan: 2 and Treatment may vary due to age or medical condition  Airway Management Planned: Nasal Cannula  Additional Equipment:   Intra-op Plan:   Post-operative Plan:   Informed Consent: I have reviewed the patients History and Physical, chart, labs and discussed the procedure including the risks, benefits and alternatives for the proposed anesthesia with the patient or authorized representative who has indicated his/her understanding and acceptance.     Dental Advisory Given  Plan Discussed with: CRNA, Anesthesiologist and Surgeon  Anesthesia Plan Comments: (Pt with persistent lower extremity weakness   )         Anesthesia Quick Evaluation

## 2018-10-10 NOTE — Transfer of Care (Signed)
Immediate Anesthesia Transfer of Care Note  Patient: Loretta Padilla  Procedure(s) Performed: POST PARTUM TUBAL LIGATION (N/A )  Patient Location: PACU  Anesthesia Type:Epidural  Level of Consciousness: sedated  Airway & Oxygen Therapy: Patient Spontanous Breathing and Patient connected to nasal cannula oxygen  Post-op Assessment: Report given to RN and Post -op Vital signs reviewed and stable  Post vital signs: Reviewed and stable  Last Vitals:  Vitals Value Taken Time  BP    Temp    Pulse    Resp    SpO2      Last Pain:  Vitals:   10/10/18 1839  TempSrc: Oral  PainSc:       Patients Stated Pain Goal: 3 (10/10/18 1837)  Complications: No apparent anesthesia complications

## 2018-10-10 NOTE — Op Note (Signed)
Loretta Padilla 10/10/2018  PREOPERATIVE DIAGNOSES: Multiparity, undesired fertility  POSTOPERATIVE DIAGNOSES: Multiparity, undesired fertility  PROCEDURE:  Postpartum Bilateral Tubal Sterilization   SURGEON: Dr. Casimiro Needle L. Taneka Espiritu  ANESTHESIA:  Epidural and local analgesia using 30 ml of 0.25% Marcaine  COMPLICATIONS:  None immediate.  ESTIMATED BLOOD LOSS: 25 ml.  FLUIDS: As recorded  URINE OUTPUT:  As recorded  INDICATIONS:  33 y.o. [id number] with undesired fertility,status post vaginal delivery, desires permanent sterilization.  Other reversible forms of contraception were discussed with patient; she declines all other modalities. Risks of procedure discussed with patient including but not limited to: risk of regret, permanence of method, bleeding, infection, injury to surrounding organs and need for additional procedures.  Failure risk of 1 -2 % with increased risk of ectopic gestation if pregnancy occurs was also discussed with patient.      FINDINGS:  Normal uterus, tubes, and ovaries.  PROCEDURE DETAILS: The patient was taken to the operating room where her epidural anesthesia was dosed up to surgical level and found to be adequate.  She was then placed in the dorsal supine position and prepped and draped in sterile fashion.  After an adequate timeout was performed, attention was turned to the patient's abdomen where a small transverse skin incision was made under the umbilical fold. The incision was taken down to the layer of fascia using the scalpel, and fascia was incised, and extended bilaterally using Mayo scissors. The peritoneum was entered in a sharp fashion. Attention was then turned to the patient's uterus, and left fallopian tube was identified and followed out to the fimbriated end.  A Filshie clip was placed on the left fallopian tube about 3 cm from the cornual attachment, with care given to incorporate the underlying mesosalpinx.  A similar process was carried out on the  right side allowing for bilateral tubal sterilization.  Good hemostasis was noted overall. The instruments were then removed from the patient's abdomen and the fascial incision was repaired with 0 Vicryl, and the skin was closed with a 4-0 Vicryl subcuticular stitch. The patient tolerated the procedure well.  Instrument, sponge, and needle counts were correct times two.  The patient was then taken to the recovery room awake and in stable condition.   Loretta Staggers, MD, FACOG Attending Obstetrician & Gynecologist Faculty Practice, [facility]

## 2018-10-11 ENCOUNTER — Encounter (HOSPITAL_COMMUNITY): Payer: Self-pay | Admitting: Obstetrics and Gynecology

## 2018-10-11 DIAGNOSIS — Z302 Encounter for sterilization: Secondary | ICD-10-CM

## 2018-10-11 MED ORDER — ACETAMINOPHEN 500 MG PO TABS
1000.0000 mg | ORAL_TABLET | Freq: Four times a day (QID) | ORAL | Status: DC
  Administered 2018-10-11 – 2018-10-12 (×2): 1000 mg via ORAL
  Filled 2018-10-11 (×2): qty 2

## 2018-10-11 MED ORDER — HYDROMORPHONE HCL 1 MG/ML IJ SOLN
1.0000 mg | INTRAMUSCULAR | Status: AC
  Administered 2018-10-11 (×3): 1 mg via INTRAVENOUS
  Filled 2018-10-11 (×3): qty 1

## 2018-10-11 MED ORDER — ACETAMINOPHEN 325 MG PO TABS
650.0000 mg | ORAL_TABLET | ORAL | Status: DC
  Administered 2018-10-11 (×2): 650 mg via ORAL
  Filled 2018-10-11 (×2): qty 2

## 2018-10-11 MED ORDER — OXYCODONE HCL 5 MG PO TABS: 5.0000 mg | ORAL_TABLET | Freq: Four times a day (QID) | ORAL | Status: DC | PRN

## 2018-10-11 MED ORDER — OXYCODONE HCL 5 MG PO TABS
5.0000 mg | ORAL_TABLET | Freq: Four times a day (QID) | ORAL | Status: DC
  Administered 2018-10-11 (×2): 5 mg via ORAL
  Filled 2018-10-11 (×2): qty 1

## 2018-10-11 NOTE — Anesthesia Postprocedure Evaluation (Signed)
Anesthesia Post Note  Patient: Loretta Padilla  Procedure(s) Performed: POST PARTUM TUBAL LIGATION (N/A )     Patient location during evaluation: Mother Baby Anesthesia Type: Epidural Level of consciousness: awake and alert Pain management: pain level controlled Vital Signs Assessment: post-procedure vital signs reviewed and stable Respiratory status: spontaneous breathing, nonlabored ventilation and respiratory function stable Cardiovascular status: stable Postop Assessment: no headache, no backache and epidural receding Anesthetic complications: no    Last Vitals:  Vitals:   10/10/18 2257 10/10/18 2316  BP: 123/74 126/79  Pulse: (!) 58 (!) 58  Resp: 14 18  Temp:  36.9 C  SpO2: 100%     Last Pain:  Vitals:   10/10/18 2316  TempSrc: Oral  PainSc: 0-No pain   Pain Goal: Patients Stated Pain Goal: 3 (10/10/18 1837)                 [name]

## 2018-10-11 NOTE — Progress Notes (Signed)
   10/11/18 0604  Vital Signs  BP 126/77  BP Location Right Arm  Patient Position (if appropriate) Semi-fowlers  BP Method Automatic  Pulse Rate 62  Pulse Rate Source Dinamap  Resp 18  Temp 97.6 F (36.4 C)  Temp Source Oral  POSS Scale (Pasero Opioid Sedation Scale)  POSS *See Group Information* 1-Acceptable,Awake and alert  This morning while getting up to the bathroom, mom felt like she passed a clot. After assisting mom the bathroom, RN noticed a  Big clot in pt's underwear the size of an tangerine. Clot was easily breakable. Fundus one below with small bleeding.

## 2018-10-11 NOTE — Progress Notes (Signed)
Post Partum Day 1 S/p SVD 10/10/2018 at 0056 S/p BTL  10/10/2018 at 2144  Subjective: voiding, tolerating PO and + flatus  Patient expresses concern about her inability to ambulate independently. She has been intermittently using the Stedy instead of consistently ambulating to bathroom  She also expresses concern regarding her pain medication. She states she does not feel she has been getting all her PRN medications. When she does, her pain is reduced to 2/10.  Patient is very worried about her BTL. She states she feels as if she "won't come back together right" inside her body.  Objective: Blood pressure 126/77, pulse 62, temperature 97.6 F (36.4 C), temperature source Oral, resp. rate 18, height 5\' 5"  (1.651 m), weight 76.9 kg, last menstrual period 12/28/2017, SpO2 100 %, unknown if currently breastfeeding.  Physical Exam:  General: alert, cooperative, appears stated age, fatigued and mild distress Lochia: appropriate Uterine Fundus: firm Incision: No abnormal findings DVT Evaluation: No evidence of DVT seen on physical exam.  Recent Labs    10/09/18 0348  HGB 9.6*  HCT 29.8*    Assessment/Plan: Social Work consult prior to discharge for EPDS score Patient to sit upright at bedside and stretch feet and legs to assist with return of sensation Patient reassured that she is healing appropriately and her BTL was very smooth PRN medications changed to scheduled medications to faciliate better communication for offering/declining medications  LOS: 2 days   Calvert Cantor, CNM 10/11/2018, 9:32 AM

## 2018-10-11 NOTE — Progress Notes (Signed)
CSW received consult for hx of Anxiety and Depression and for MOB's edinburgh score 15.  CSW met with MOB to offer support and complete assessment.    CSW met with MOB at bedside to discuss mental health history and edinburgh score 15, MOB was accompanied by FOB and her mother. CSW asked MOB's guests to leave during assessment with MOB's permission, MOB's guest left voluntarily. CSW introduced self and explained reason for consult. MOB was open and forthcoming when speaking with CSW. CSW and MOB discussed MOB's mental health history, MOB reported that she has a history of postpartum depression and anxiety. MOB reported that she experienced post partum depression 10 years ago after giving birth to her son. MOB reported that her symptoms were exhaustion, mood swings, doubting self, denial, lack of confidence, detachment and sadness. MOB reported that her symptoms started a few weeks after giving birth. MOB reported that she went to a MD about her PPD and was prescribed Prozac which was helpful. MOB reported that she is still on Prozac and receiving therapy. MOB reported that she was diagnosed with anxiety quite a few years ago and is on a medication as needed. MOB was unable to recall the name of the medication.  CSW and MOB discussed edinburgh score 15. CSW asked MOB how she was currently feeling, MOB reported that she felt discouraged because she sprained her hip/groin area while giving birth and is unable to get up and walk. MOB shared that she is an independent person. CSW normalized MOB's feelings and encouraged MOB to focus on healing to be able to care for infant and other children. CSW inquired about MOB's support system, MOB reported that her parents are supportive. MOB reported that FOB is involved. MOB reported that she has most everything she needs for the baby and should be able to get other items that are needed.  CSW asked MOB if she felt attached and bonded with infant, MOB reported that  initially she was concerned about feeling attached to infant. MOB looked over and smiled at the basinet and reported "I guess he's a keeper". Infant was asleep during assessment in basinet. MOB presented calm and was forthcoming about her mental health history. MOB reported that she is taking medication to treat anxiety and depression and receiving therapy. MOB did not demonstrate any acute mental health concerns. CSW assessed for safety, MOB denied SI, HI and DV.  CSW provided education regarding the baby blues period vs. perinatal mood disorders, discussed treatment and gave resources for mental health follow up if concerns arise.  CSW recommends self-evaluation during the postpartum time period using the New Mom Checklist from Postpartum Progress and encouraged MOB to contact a medical professional if symptoms are noted at any time.    CSW asked MOB if she had any questions/concerns, MOB requested information on support groups. CSW provided MOB with information on support groups offered at [facility].    CSW identifies no further need for intervention and no barriers to discharge at this time.  Loretta Padilla, LCSWA Clinical Social Worker [facility] Cell#: [phone]           

## 2018-10-12 ENCOUNTER — Encounter (HOSPITAL_COMMUNITY): Payer: Self-pay

## 2018-10-12 MED ORDER — SENNOSIDES-DOCUSATE SODIUM 8.6-50 MG PO TABS: 2.0000 | ORAL_TABLET | ORAL | 0 refills | Status: DC

## 2018-10-12 MED ORDER — IBUPROFEN 600 MG PO TABS: 600.0000 mg | ORAL_TABLET | Freq: Four times a day (QID) | ORAL | 0 refills | Status: DC

## 2018-10-12 NOTE — Progress Notes (Signed)
   [name] is a 33 y.o. [id number] s/p SVD 10/10/2018 at 0056, s/p BTL 10/10/2018 at 2144  At bedside to assess patient's response to plan of care we devised this morning. Patient denies focused pelvic pain but says she's "paying the price" for ambulating all over today. She states she even ambulated outside with her mother and feels "much better" about her physical abilities as she prepares for discharge tomorrow. She denies complaints or concerns at this time.  Clayton Bibles, CNM 10/11/2018 9:20 PM

## 2018-10-25 ENCOUNTER — Encounter (HOSPITAL_COMMUNITY): Payer: Self-pay | Admitting: Psychiatry

## 2018-10-25 ENCOUNTER — Ambulatory Visit (INDEPENDENT_AMBULATORY_CARE_PROVIDER_SITE_OTHER): Payer: Medicare Other | Admitting: Psychiatry

## 2018-10-25 VITALS — BP 104/72 | HR 91 | Ht 65.0 in | Wt 146.0 lb

## 2018-10-25 DIAGNOSIS — F331 Major depressive disorder, recurrent, moderate: Secondary | ICD-10-CM | POA: Diagnosis not present

## 2018-10-25 MED ORDER — ZOLPIDEM TARTRATE 10 MG PO TABS: 10.0000 mg | ORAL_TABLET | Freq: Every evening | ORAL | 2 refills | Status: DC | PRN

## 2018-10-25 MED ORDER — FLUOXETINE HCL 20 MG PO CAPS: 20.0000 mg | ORAL_CAPSULE | Freq: Two times a day (BID) | ORAL | 2 refills | Status: DC
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
## 2018-11-02 ENCOUNTER — Ambulatory Visit (HOSPITAL_COMMUNITY): Payer: Medicare Other | Admitting: Psychiatry

## 2018-11-14 ENCOUNTER — Ambulatory Visit: Payer: Self-pay | Admitting: Women's Health

## 2018-11-15 ENCOUNTER — Other Ambulatory Visit: Payer: Self-pay

## 2018-11-15 ENCOUNTER — Ambulatory Visit (INDEPENDENT_AMBULATORY_CARE_PROVIDER_SITE_OTHER): Payer: Medicare Other | Admitting: Women's Health

## 2018-11-15 ENCOUNTER — Encounter: Payer: Self-pay | Admitting: Women's Health

## 2018-11-15 DIAGNOSIS — Z1389 Encounter for screening for other disorder: Secondary | ICD-10-CM

## 2018-11-15 DIAGNOSIS — Z9851 Tubal ligation status: Secondary | ICD-10-CM

## 2018-11-15 NOTE — Progress Notes (Signed)
POSTPARTUM VISIT Patient name: Loretta Padilla MRN [id number]  Date of birth: 1986/02/04 Chief Complaint:   Postpartum Care  History of Present Illness:   Loretta Padilla is a 33 y.o. [phone] Caucasian female being seen today for a postpartum visit. She is 4 weeks postpartum following a spontaneous vaginal delivery at 40 gestational weeks after IOL for polyhydramnios dx by BPP done for NRNST in [facility]- AFI 38cm. Had inpatient BTL. Anesthesia: epidural. Laceration: none. I have fully reviewed the prenatal and intrapartum course. Pregnancy complicated by polyhydramnios dx on admit. Postpartum course has been complicated by inability to walk in hospital, states she was told she 'fractured her hips', had to have PT come in to work w/ her. I see a note from DO who did some therapy w/ her, definitely no fractures. Bleeding no bleeding. Bowel function is constipation. Bladder function is normal.  Patient is not sexually active. Last sexual activity: prior to birth of baby.  Contraception method is tubal ligation.  [place] Postpartum Depression Screening: negative. Score 4.   Last pap 01/2017.  Results were normal .  No LMP recorded.  Baby's course has been uncomplicated. Baby is feeding by bottle.  Review of Systems:   Pertinent items are noted in HPI Denies Abnormal vaginal discharge w/ itching/odor/irritation, headaches, visual changes, shortness of breath, chest pain, abdominal pain, severe nausea/vomiting, or problems with urination or bowel movements. Pertinent History Reviewed:  Reviewed past medical,surgical, obstetrical and family history.  Reviewed problem list, medications and allergies. OB History  Gravida Para Term Preterm AB Living  8 4 4   4 4   SAB TAB Ectopic Multiple Live Births  2 2   0 4    # Outcome Date GA Lbr Len/2nd Weight Sex Delivery Anes PTL Lv  8 Term 10/10/18 [redacted]w[redacted]d 165:49 / 00:07 7 lb 3.5 oz (3.274 kg) M Vag-Spont EPI  LIV  7 Term 03/24/12 [redacted]w[redacted]d   F Vag-Spont EPI N  LIV  6 Term 12/26/07 [redacted]w[redacted]d   M Vag-Spont EPI N LIV  5 SAB 2006          4 Term 06/23/04 [redacted]w[redacted]d   M Vag-Spont EPI  LIV  3 TAB           2 TAB           1 SAB            Physical Assessment:   Vitals:   11/15/18 1138  BP: 110/82  Pulse: 87  Weight: 145 lb 3.2 oz (65.9 kg)  Height: 5\' 5"  (1.651 m)  Body mass index is 24.16 kg/m.       Physical Examination:   General appearance: alert, well appearing, and in no distress  Mental status: alert, oriented to person, place, and time  Skin: warm & dry   Cardiovascular: normal heart rate noted   Respiratory: normal respiratory effort, no distress   Breasts: deferred, no complaints   Abdomen: soft, non-tender, BTL incision well-healed  Pelvic: VULVA: normal appearing vulva with no masses, tenderness or lesions, UTERUS: uterus is normal size, shape, consistency and nontender  Rectal: small external non-thrombosed hemorrhoids  Extremities: no edema       No results found for this or any previous visit (from the past 24 hour(s)).  Assessment & Plan:  1) Postpartum exam 2) 4 wks s/p SVB after IOL for polyhydramnios 3) S/P BTL 4) Bottlefeeding 5) Depression screening 6) Constipation> gave printed prevention/relief measures    Meds: No orders of the defined types  were placed in this encounter.   Follow-up: Return in about 1 year (around 11/15/2019) for Pap & physical.   No orders of the defined types were placed in this encounter.   Cheral Marker CNM, [facility] 11/15/2018 12:43 PM

## 2018-11-15 NOTE — Patient Instructions (Signed)

## 2018-11-29 IMAGING — DX DG FOOT COMPLETE 3+V*L*
3 series · 3 of 3 positions shown · non-contrast
Comparison: None.

CLINICAL DATA: Pain following fall

EXAM:
LEFT FOOT - COMPLETE 3+ VIEW

[foot ap]
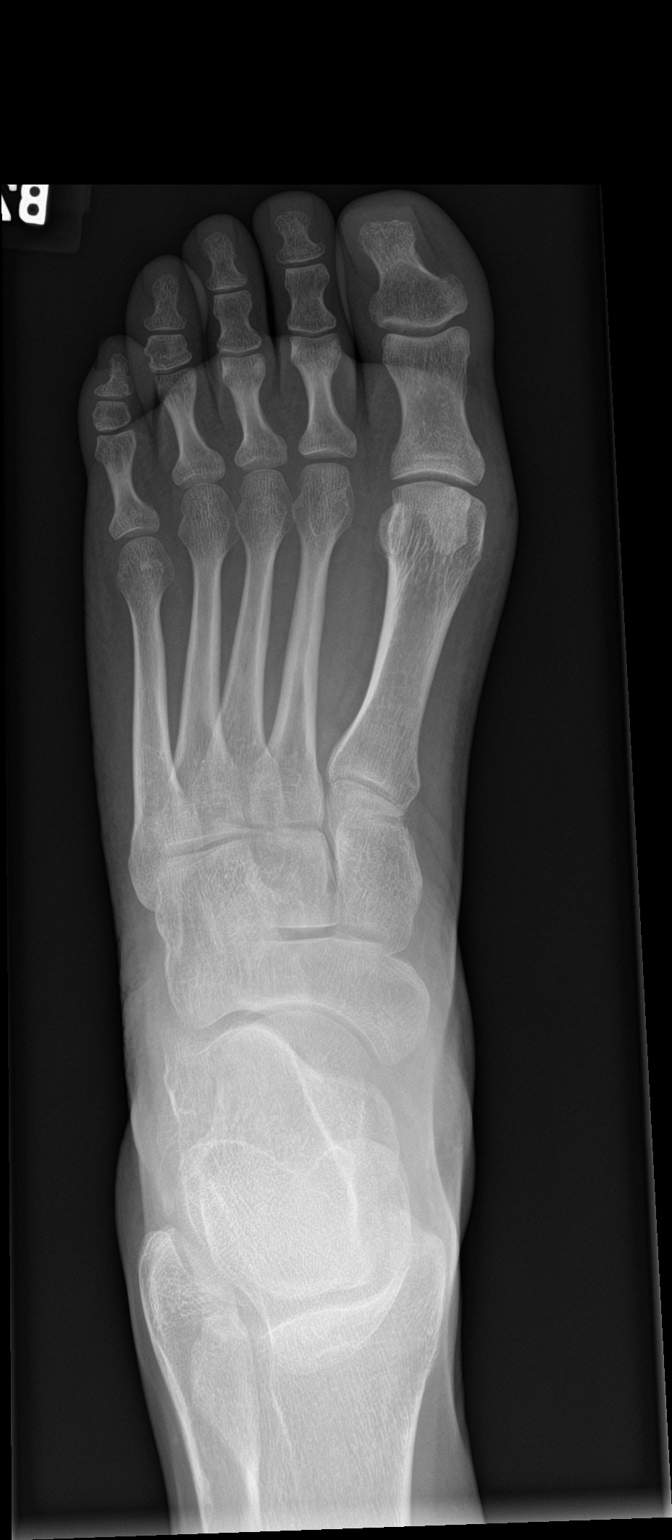

[foot obl]
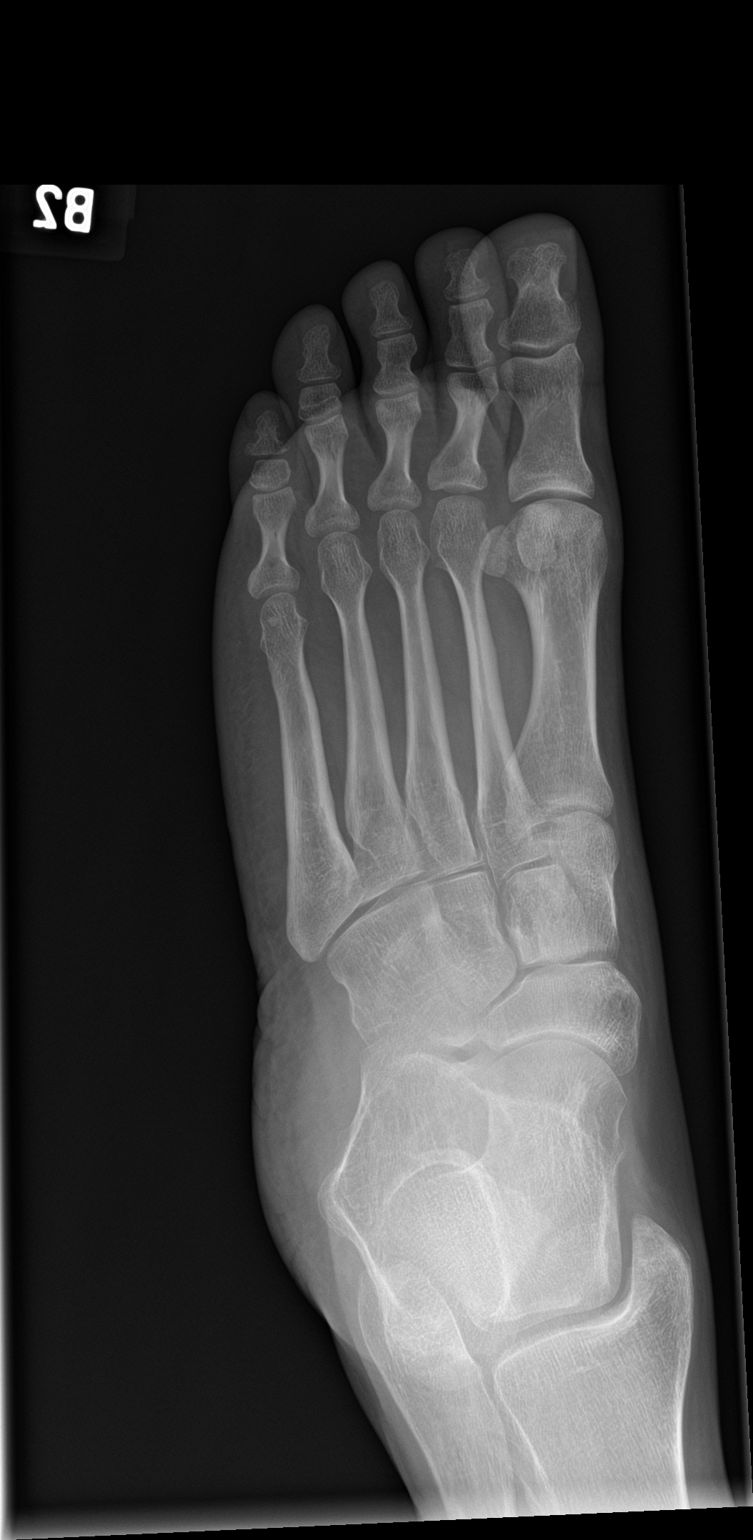

[foot lat]
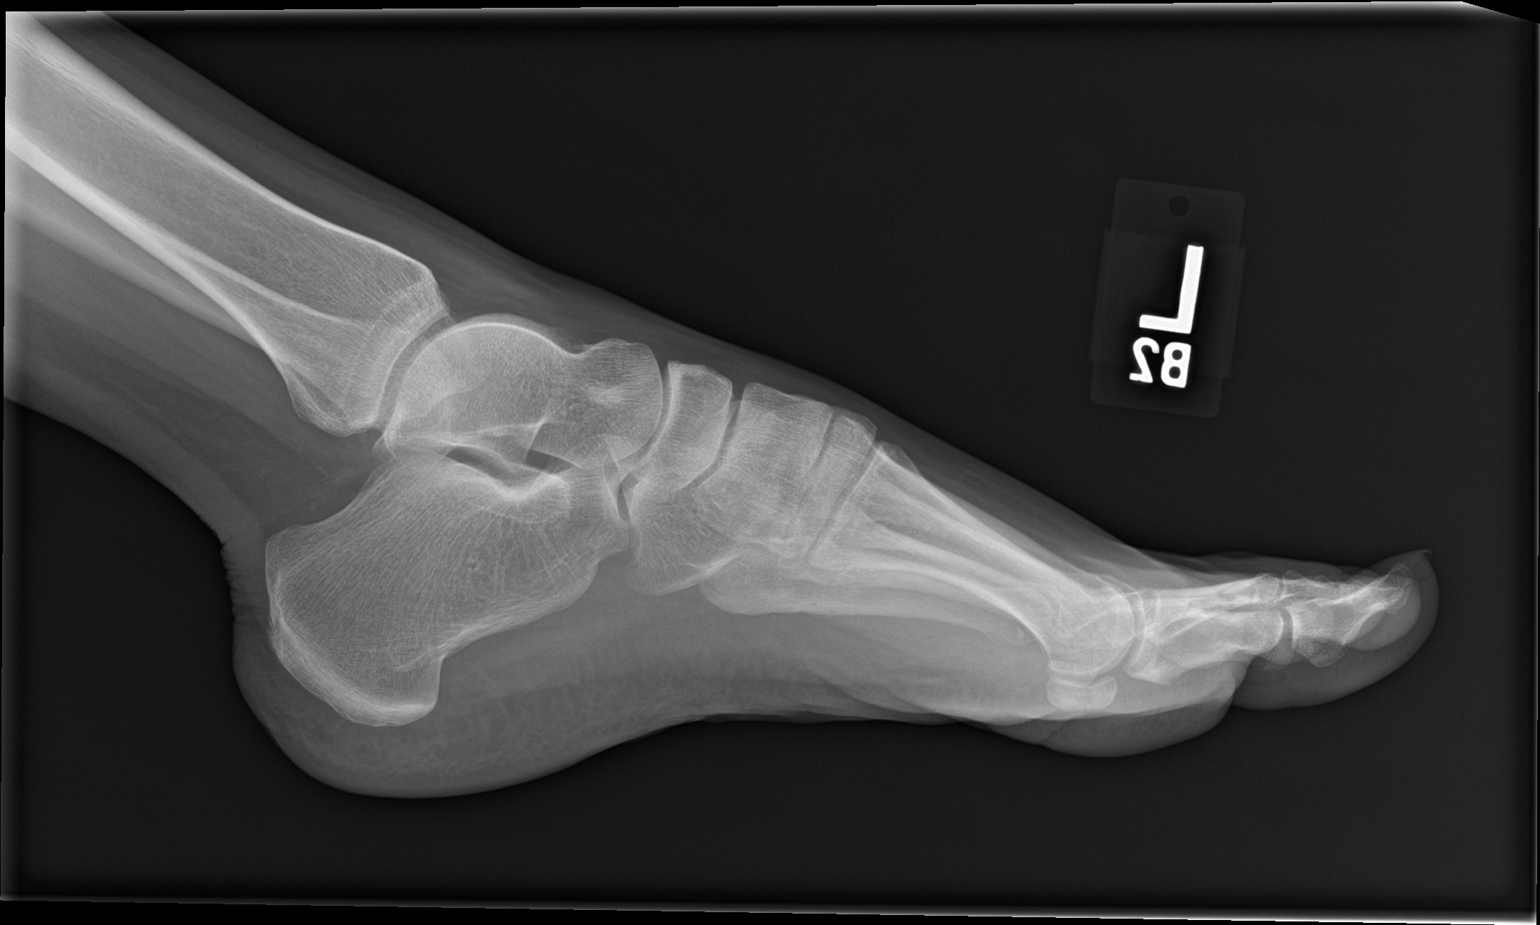

[3 of 3 positions shown; findings below may reference images not displayed]

FINDINGS: Frontal, oblique, and lateral views were obtained. No fracture or
dislocation. Joint spaces appear normal. No erosive change.
IMPRESSION: No fracture or dislocation.  No apparent arthropathy.

## 2018-12-06 ENCOUNTER — Ambulatory Visit (INDEPENDENT_AMBULATORY_CARE_PROVIDER_SITE_OTHER): Payer: Medicare Other | Admitting: Psychiatry

## 2018-12-06 ENCOUNTER — Other Ambulatory Visit: Payer: Self-pay

## 2018-12-06 ENCOUNTER — Encounter (HOSPITAL_COMMUNITY): Payer: Self-pay | Admitting: Psychiatry

## 2018-12-06 DIAGNOSIS — Z79899 Other long term (current) drug therapy: Secondary | ICD-10-CM

## 2018-12-06 DIAGNOSIS — F418 Other specified anxiety disorders: Secondary | ICD-10-CM | POA: Diagnosis not present

## 2018-12-06 DIAGNOSIS — F331 Major depressive disorder, recurrent, moderate: Secondary | ICD-10-CM | POA: Diagnosis not present

## 2018-12-06 MED ORDER — CLONAZEPAM 0.5 MG PO TABS: 0.5000 mg | ORAL_TABLET | Freq: Two times a day (BID) | ORAL | 2 refills | Status: DC | PRN

## 2018-12-06 MED ORDER — FLUOXETINE HCL 20 MG PO CAPS: 20.0000 mg | ORAL_CAPSULE | Freq: Two times a day (BID) | ORAL | 2 refills | Status: DC

## 2018-12-06 MED ORDER — ZOLPIDEM TARTRATE 10 MG PO TABS: 10.0000 mg | ORAL_TABLET | Freq: Every evening | ORAL | 2 refills | Status: DC | PRN
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
## 2019-01-06 ENCOUNTER — Other Ambulatory Visit: Payer: Self-pay

## 2019-01-06 ENCOUNTER — Ambulatory Visit (INDEPENDENT_AMBULATORY_CARE_PROVIDER_SITE_OTHER): Payer: Medicare Other | Admitting: Psychiatry

## 2019-01-06 ENCOUNTER — Encounter (HOSPITAL_COMMUNITY): Payer: Self-pay | Admitting: Psychiatry

## 2019-01-06 DIAGNOSIS — F331 Major depressive disorder, recurrent, moderate: Secondary | ICD-10-CM

## 2019-01-06 MED ORDER — FLUOXETINE HCL 20 MG PO CAPS: 20.0000 mg | ORAL_CAPSULE | Freq: Two times a day (BID) | ORAL | 2 refills | Status: DC

## 2019-01-06 MED ORDER — ZOLPIDEM TARTRATE 10 MG PO TABS: 10.0000 mg | ORAL_TABLET | Freq: Every evening | ORAL | 2 refills | Status: DC | PRN

## 2019-01-06 MED ORDER — ARIPIPRAZOLE 2 MG PO TABS: 2.0000 mg | ORAL_TABLET | Freq: Every day | ORAL | 2 refills | Status: DC

## 2019-01-06 MED ORDER — CLONAZEPAM 0.5 MG PO TABS: 0.5000 mg | ORAL_TABLET | Freq: Two times a day (BID) | ORAL | 2 refills | Status: DC | PRN
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
## 2019-01-16 ENCOUNTER — Other Ambulatory Visit: Payer: Self-pay

## 2019-01-16 ENCOUNTER — Encounter: Payer: Self-pay | Admitting: Neurology

## 2019-01-16 ENCOUNTER — Ambulatory Visit (INDEPENDENT_AMBULATORY_CARE_PROVIDER_SITE_OTHER): Payer: Medicare Other | Admitting: Neurology

## 2019-01-16 ENCOUNTER — Telehealth: Payer: Self-pay

## 2019-01-16 DIAGNOSIS — G40909 Epilepsy, unspecified, not intractable, without status epilepticus: Secondary | ICD-10-CM

## 2019-01-16 MED ORDER — TOPIRAMATE 100 MG PO TABS: 300.0000 mg | ORAL_TABLET | Freq: Two times a day (BID) | ORAL | 4 refills | Status: DC

## 2019-01-16 NOTE — Telephone Encounter (Signed)
Received a call from [employer], Marchelle Folks. She is concerned about the topamax RX received today for pt to be taking 600mg  daily (3 tablets BID).  Per Dr. Zannie Cove note from today, " Increase Topamax to 100 2 tablets in the morning, 3 tablets at nighttime". This would be 500mg  daily.  [facility] is requesting clarification on this; Marchelle Folks reports the maximum daily recommended dose of topamax is 400mg .

## 2019-01-16 NOTE — Telephone Encounter (Signed)
Please call patient, she was on topamax 100mg  3 tabs twice a day during pregnancy, currently is not breast-feeding, had normal delivery on October 10, 2018, but complains of staring spells,  I have advised her to keep current dose of Topamax 100 mg twice daily, check Topamax trough level, will make further decision about Topamax dosage aft level came back,

## 2019-01-16 NOTE — Progress Notes (Signed)
[facility]  PATIENT: Loretta Padilla DOB: 02/10/1986   REASON FOR VISIT: Follow-up for seizure disorder HISTORY FROM: Patient    HISTORY OF PRESENT ILLNESS: Loretta Padilla is a 33 year old female, accompanied by her boyfriend Loretta Padilla, seen in refer by her primary care doctor  [name], [name], for evaluation of seizure, initial evaluation was October 8th 2018.  I reviewed and summarized the referring note, she had a past medical history of depression, epilepsy, is taking Topamax 100 mg, 2 tablets twice a day, She recently moved from [place] to [place] in June 2018,  She was previously under the care of local neurologist Dr.Richard Sharee Holster. Gam[place], MD (Phone: [phone]; Fax:  [phone]; [facility]- [phone] [address] [phone] [address], [address] [place], [place] [id number]).  She reported a history of epilepsy since 33 years old, she used to have generalized tonic-clonic seizure, tried different medications, has been on stable dose of Topamax 100 mg 2 tablets twice a day for many years, but because of frequent recurrent seizures, eventually had VNS placement in December 2014, she reported mild improvement with the VNS, post surgical one year, she has not had generalized seizure, but began to have recurrent smaller spells, staring into the space, unresponsive for few minutes, she began to have increased spells again 2017, now having a spell on a monthly basis, rarely generalized tonic-clonic seizures,  Recent adjustment for her VNS was in June 2018, she noticed wearing off every 5 minutes, with mild left neck pain,   I reviewed the laboratory evaluation in September 2018: Normal CMP, CBC, hemoglobin of 13.2  UPDATE Nov 04 2017:YY She is now taking topamax 100mg  2 tab bid, onfi 20mg  qhs since Oct 2018,   She was having "silenct seizure' when she runs out of her Topamax 400 mg daily, she has dizziness, lightheadedness, staring off  into space, confused, lasting for few second, no GTC events.  Those small seizure-like spells can happen multiple times a day.  Add on onfi 20 mg every night has made a difference, she no longer has recurrent generalized tonic-clonic seizure, before that, she was having it on a monthly basis, She is also taking Prozac for depression, and other simple medication from her psychiatrist office, complains of chronic insomnia, Today we also performed a VNS interrogation, adjustment, she complains of could not tolerate magnet stimulation  Update June 4th 2019:  She has children at age 33, 5210, 945, she was taking on topamax while 33 years old has GI issue, 11, health, 5 years ferile seizure.  Last peroid was on May 1, 2nd.  Before that was March 24, 25, 26th.  Home preganncy test was positive  She has not has grandmal seizure, for a long times,     UPDATE Sept 19 2019: She had one seizure on May 24, 2018, feel it coming on, felt dizzy, spinning sensation, loss of consciousness bed, heard her left foot, she is currently [redacted] weeks pregnant, is going to have a boy, due date is on Oct 10 2018.  She continue have smaller spells, staring, unresponsiveness, taking Topamax 100 mg 2 tablets twice a day," more seizure medications did not make any difference"  She could not tolerate strong VNS settings, complains of throat pain  Virtual Visit via Video  I connected with Loretta Padilla on 01/16/19 at  by Video and verified that I am speaking with the correct person using two identifiers.   I discussed the limitations, risks, security and privacy concerns of  performing an evaluation and management service by video and the availability of in person appointments. I also discussed with the patient that there may be a patient responsible charge related to this service. The patient expressed understanding and agreed to proceed.   History of Present Illness: She has normal healthy delivery on October 10, 2018, continue have intermittent staring spells, prior to the pregnancy she was taking Topamax 100 mg 2 tablets twice a day, during pregnancy, dosage was increased to 100 mg 3 tablets twice a day  She is tolerating the medication well, continue have intermittent staring spells, she is not breast-feeding,    Observations/Objective: I have reviewed problem lists, medications, allergies.  Awake alert oriented to history taking care of conversation Assessment and Plan: ASSESSMENT AND PLAN 33 year old female, Epilepsy  Healthy delivery on  October 10, 2018  Continue Topamax 100 mg 3 tablets twice a day  Check Topamax level  Depend on the level, she is postpartum now, may decrease Topamax to lower level, such as 200 in the morning, 300 at night,   Follow Up Instructions:   6 months   I discussed the assessment and treatment plan with the patient. The patient was provided an opportunity to ask questions and all were answered. The patient agreed with the plan and demonstrated an understanding of the instructions.   The patient was advised to call back or seek an in-person evaluation if the symptoms    Levert Feinstein, M.D. Ph.D.  [facility] [address] [place], [place] [address] Phone: [phone] Fax:      [phone]

## 2019-01-17 ENCOUNTER — Encounter: Payer: Self-pay | Admitting: *Deleted

## 2019-01-17 NOTE — Telephone Encounter (Addendum)
I was able to speak to the patient.  She plans to come in on 01/18/2019 to complete her labs.  She verbalized understanding to hold her morning dose of topiramate until after her labs have been drawn.  She did confirm her current dosage of topiramate 100mg  to be three tablets in am and three tablets in pm.

## 2019-01-17 NOTE — Telephone Encounter (Signed)
I have left the patient a voicemail and sent a my chart message requesting her to come to our office for labs, in the morning, prior to taking her am dose of topiramate.

## 2019-01-18 ENCOUNTER — Other Ambulatory Visit (INDEPENDENT_AMBULATORY_CARE_PROVIDER_SITE_OTHER): Payer: Self-pay

## 2019-01-18 ENCOUNTER — Other Ambulatory Visit: Payer: Self-pay

## 2019-01-18 DIAGNOSIS — G40909 Epilepsy, unspecified, not intractable, without status epilepticus: Secondary | ICD-10-CM

## 2019-01-18 DIAGNOSIS — Z0289 Encounter for other administrative examinations: Secondary | ICD-10-CM

## 2019-01-20 LAB — COMPREHENSIVE METABOLIC PANEL
ALT: 14 IU/L (ref 0–32)
AST: 13 IU/L (ref 0–40)
Albumin/Globulin Ratio: 1.9 (ref 1.2–2.2)
Albumin: 4.6 g/dL (ref 3.8–4.8)
Alkaline Phosphatase: 57 IU/L (ref 39–117)
BUN/Creatinine Ratio: 16 (ref 9–23)
BUN: 21 mg/dL — ABNORMAL HIGH (ref 6–20)
Bilirubin Total: 0.3 mg/dL (ref 0.0–1.2)
CO2: 18 mmol/L — ABNORMAL LOW (ref 20–29)
Calcium: 9.4 mg/dL (ref 8.7–10.2)
Chloride: 106 mmol/L (ref 96–106)
Creatinine, Ser: 1.33 mg/dL — ABNORMAL HIGH (ref 0.57–1.00)
GFR calc Af Amer: 60 mL/min/{1.73_m2} (ref >59)
GFR calc non Af Amer: 52 mL/min/{1.73_m2} — ABNORMAL LOW (ref >59)
Globulin, Total: 2.4 g/dL (ref 1.5–4.5)
Glucose: 144 mg/dL — ABNORMAL HIGH (ref 65–99)
Potassium: 4.2 mmol/L (ref 3.5–5.2)
Sodium: 138 mmol/L (ref 134–144)
Total Protein: 7 g/dL (ref 6.0–8.5)

## 2019-01-20 LAB — CBC
Hematocrit: 39.1 % (ref 34.0–46.6)
Hemoglobin: 12.7 g/dL (ref 11.1–15.9)
MCH: 29.4 pg (ref 26.6–33.0)
MCHC: 32.5 g/dL (ref 31.5–35.7)
MCV: 91 fL (ref 79–97)
Platelets: 282 10*3/uL (ref 150–450)
RBC: 4.32 x10E6/uL (ref 3.77–5.28)
RDW: 13.2 % (ref 11.7–15.4)
WBC: 5.6 10*3/uL (ref 3.4–10.8)

## 2019-01-20 LAB — TSH: TSH: 1.31 u[IU]/mL (ref 0.450–4.500)

## 2019-01-20 LAB — TOPIRAMATE LEVEL: Topiramate Lvl: 16.8 ug/mL (ref 2.0–25.0)

## 2019-01-23 NOTE — Progress Notes (Signed)
I have reviewed and agreed above plan. 

## 2019-02-01 ENCOUNTER — Other Ambulatory Visit (HOSPITAL_COMMUNITY): Payer: Self-pay | Admitting: Psychiatry

## 2019-02-01 ENCOUNTER — Telehealth (HOSPITAL_COMMUNITY): Payer: Self-pay | Admitting: *Deleted

## 2019-02-01 MED ORDER — ZOLPIDEM TARTRATE 10 MG PO TABS: 10.0000 mg | ORAL_TABLET | Freq: Every evening | ORAL | 2 refills | Status: DC | PRN

## 2019-02-01 MED ORDER — CLONAZEPAM 0.5 MG PO TABS: 0.5000 mg | ORAL_TABLET | Freq: Two times a day (BID) | ORAL | 2 refills | Status: DC | PRN

## 2019-02-01 NOTE — Telephone Encounter (Signed)
Dr Tenny Craw Patient called stating she's visiting in [place]. And that [facility] transferred her script for Abilify but new scripts would need to be sent for the Klonopin & the Ambien . Added [facility] in [place] to Rx preference list.

## 2019-02-01 NOTE — Telephone Encounter (Signed)
sent 

## 2019-02-24 DIAGNOSIS — G40309 Generalized idiopathic epilepsy and epileptic syndromes, not intractable, without status epilepticus: Secondary | ICD-10-CM | POA: Diagnosis not present

## 2019-02-24 DIAGNOSIS — H52223 Regular astigmatism, bilateral: Secondary | ICD-10-CM | POA: Diagnosis not present

## 2019-02-24 DIAGNOSIS — H538 Other visual disturbances: Secondary | ICD-10-CM | POA: Diagnosis not present

## 2019-04-15 ENCOUNTER — Other Ambulatory Visit (HOSPITAL_COMMUNITY): Payer: Self-pay | Admitting: Psychiatry

## 2019-04-17 NOTE — Telephone Encounter (Signed)
Call for f/u 

## 2019-04-17 NOTE — Telephone Encounter (Signed)
8/10 LVM FOR F/U

## 2019-04-28 ENCOUNTER — Other Ambulatory Visit: Payer: Self-pay

## 2019-04-28 ENCOUNTER — Encounter (HOSPITAL_COMMUNITY): Payer: Self-pay | Admitting: Psychiatry

## 2019-04-28 ENCOUNTER — Ambulatory Visit (INDEPENDENT_AMBULATORY_CARE_PROVIDER_SITE_OTHER): Payer: Medicaid Other | Admitting: Psychiatry

## 2019-04-28 DIAGNOSIS — F331 Major depressive disorder, recurrent, moderate: Secondary | ICD-10-CM

## 2019-04-28 DIAGNOSIS — G40909 Epilepsy, unspecified, not intractable, without status epilepticus: Secondary | ICD-10-CM

## 2019-04-28 MED ORDER — CLONAZEPAM 0.5 MG PO TABS: 0.5000 mg | ORAL_TABLET | Freq: Two times a day (BID) | ORAL | 2 refills | Status: DC | PRN

## 2019-04-28 MED ORDER — ZOLPIDEM TARTRATE 10 MG PO TABS: 10.0000 mg | ORAL_TABLET | Freq: Every evening | ORAL | 2 refills | Status: DC | PRN

## 2019-04-28 MED ORDER — FLUOXETINE HCL 20 MG PO CAPS: 20.0000 mg | ORAL_CAPSULE | Freq: Two times a day (BID) | ORAL | 2 refills | Status: DC

## 2019-04-28 MED ORDER — ARIPIPRAZOLE 2 MG PO TABS: 2.0000 mg | ORAL_TABLET | Freq: Every day | ORAL | 2 refills | Status: DC
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
## 2019-05-29 ENCOUNTER — Other Ambulatory Visit: Payer: Self-pay

## 2019-05-29 ENCOUNTER — Emergency Department (HOSPITAL_COMMUNITY): Payer: Medicare Other

## 2019-05-29 ENCOUNTER — Emergency Department (HOSPITAL_COMMUNITY)
Admission: EM | Admit: 2019-05-29 | Discharge: 2019-05-29 | Disposition: A | Discharge status: Home / Self Care | Payer: Medicare Other | Attending: Emergency Medicine | Admitting: Emergency Medicine

## 2019-05-29 ENCOUNTER — Encounter (HOSPITAL_COMMUNITY): Payer: Self-pay

## 2019-05-29 DIAGNOSIS — Y92017 Garden or yard in single-family (private) house as the place of occurrence of the external cause: Secondary | ICD-10-CM | POA: Insufficient documentation

## 2019-05-29 DIAGNOSIS — S8992XA Unspecified injury of left lower leg, initial encounter: Secondary | ICD-10-CM | POA: Diagnosis not present

## 2019-05-29 DIAGNOSIS — Y93H2 Activity, gardening and landscaping: Secondary | ICD-10-CM | POA: Insufficient documentation

## 2019-05-29 DIAGNOSIS — W208XXA Other cause of strike by thrown, projected or falling object, initial encounter: Secondary | ICD-10-CM | POA: Insufficient documentation

## 2019-05-29 DIAGNOSIS — Y999 Unspecified external cause status: Secondary | ICD-10-CM | POA: Diagnosis not present

## 2019-05-29 DIAGNOSIS — Z23 Encounter for immunization: Secondary | ICD-10-CM | POA: Insufficient documentation

## 2019-05-29 DIAGNOSIS — S81832A Puncture wound without foreign body, left lower leg, initial encounter: Secondary | ICD-10-CM | POA: Diagnosis not present

## 2019-05-29 DIAGNOSIS — F17210 Nicotine dependence, cigarettes, uncomplicated: Secondary | ICD-10-CM | POA: Insufficient documentation

## 2019-05-29 MED ORDER — IBUPROFEN 400 MG PO TABS
400.0000 mg | ORAL_TABLET | Freq: Once | ORAL | Status: AC
  Administered 2019-05-29: 400 mg via ORAL
  Filled 2019-05-29: qty 1

## 2019-05-29 MED ORDER — BACITRACIN-NEOMYCIN-POLYMYXIN 400-5-5000 EX OINT
TOPICAL_OINTMENT | Freq: Once | CUTANEOUS | Status: AC
  Administered 2019-05-29: 16:00:00 via TOPICAL
  Filled 2019-05-29: qty 1

## 2019-05-29 MED ORDER — ACETAMINOPHEN 500 MG PO TABS
1000.0000 mg | ORAL_TABLET | Freq: Once | ORAL | Status: AC
  Administered 2019-05-29: 1000 mg via ORAL
  Filled 2019-05-29: qty 2

## 2019-05-29 MED ORDER — DOXYCYCLINE HYCLATE 100 MG PO TABS
100.0000 mg | ORAL_TABLET | Freq: Once | ORAL | Status: AC
  Administered 2019-05-29: 100 mg via ORAL
  Filled 2019-05-29: qty 1

## 2019-05-29 MED ORDER — DOXYCYCLINE HYCLATE 100 MG PO CAPS: 100.0000 mg | ORAL_CAPSULE | Freq: Two times a day (BID) | ORAL | 0 refills | Status: DC

## 2019-05-29 MED ORDER — TETANUS-DIPHTH-ACELL PERTUSSIS 5-2.5-18.5 LF-MCG/0.5 IM SUSP
0.5000 mL | Freq: Once | INTRAMUSCULAR | Status: AC
  Administered 2019-05-29: 0.5 mL via INTRAMUSCULAR
  Filled 2019-05-29: qty 0.5

## 2019-05-29 NOTE — Discharge Instructions (Addendum)
Please cleanse the wound to your leg daily with soap and water.  Please apply fresh dressing daily.  Please use doxycycline 2 times daily with food.  Use Tylenol every 4 hours or ibuprofen every 6 hours for fever, and/or aching.  Please see your primary physician or return to the emergency department if any pus like drainage from the wound, any red streaks going up your leg, any fever that will not respond to Tylenol or ibuprofen, worsening of your symptoms, changes in your general condition, problems or concerns.

## 2019-05-29 NOTE — ED Provider Notes (Signed)
[facility] Provider Note   CSN: [id number] Arrival date & time: 05/29/19  1312     History   Chief Complaint No chief complaint on file.   HPI Loretta Padilla is a 33 y.o. female.     Patient is a 33 year old female who presents to the emergency department with injury to the left shin.  Patient states that 2 days ago she was mowing the lawn, and something flew out from under the lawnmower and hit her left shin.  She had bleeding present.  She applied pressure.  She says she has been cleaning the area and applying a Band-Aid daily.  There was increased redness at the site, and she was told by friends that she should have this checked.  She presents now for assistance with this issue.  No recent fever or chills reported.  No red streaks going up the leg.  The patient denies any medical issues that would affect her immune system.  She has never been diagnosed with methicillin-resistant staph.    The history is provided by the patient.    Past Medical History:  Diagnosis Date   Anxiety    Back pain    Depression    Kidney stones    Seizures [facility])     Patient Active Problem List   Diagnosis Date Noted   History of bilateral tubal ligation 11/15/2018   Insomnia 03/15/2018   Epilepsy ([place]) 11/23/2017   Smoker 08/03/2017   Depression 05/07/2017    Past Surgical History:  Procedure Laterality Date   IMPLANTATION VAGAL NERVE STIMULATOR     TUBAL LIGATION N/A 10/10/2018   Procedure: POST PARTUM TUBAL LIGATION;  Surgeon: Chancy Milroy, MD;  Location: [place];  Service: Gynecology;  Laterality: N/A;   VSD REPAIR       OB History    Gravida  8   Para  4   Term  4   Preterm      AB  4   Living  4     SAB  2   TAB  2   Ectopic      Multiple  0   Live Births  4            Home Medications    Prior to Admission medications   Medication Sig Start Date End Date Taking? Authorizing Provider  ARIPiprazole  (ABILIFY) 2 MG tablet Take 1 tablet (2 mg total) by mouth daily. 04/28/19   Cloria Spring, MD  clonazePAM (KLONOPIN) 0.5 MG tablet Take 1 tablet (0.5 mg total) by mouth 2 (two) times daily as needed for anxiety. 04/28/19 04/27/20  Cloria Spring, MD  cyclobenzaprine (FLEXERIL) 10 MG tablet Take 1 tablet (10 mg total) by mouth 3 (three) times daily as needed for muscle spasms. 08/02/18   Caroline More, DO  FLUoxetine (PROZAC) 20 MG capsule Take 1 capsule (20 mg total) by mouth 2 (two) times daily. 04/28/19   Cloria Spring, MD  fluticasone (FLONASE) 50 MCG/ACT nasal spray Place 1 spray into both nostrils daily. 05/27/18   Petrucelli, Samantha R, PA-C  ibuprofen (ADVIL,MOTRIN) 600 MG tablet Take 1 tablet (600 mg total) by mouth every 6 (six) hours. Patient not taking: Reported on 11/15/2018 10/12/18   Durwin Glaze, DO  oxymetazoline (AFRIN) 0.05 % nasal spray Place 1 spray into both nostrils 2 (two) times daily as needed for congestion.     [provider]  Prenatal Vit-Fe Fumarate-FA (MULTIVITAMIN-PRENATAL) 27-0.8 MG  TABS tablet Take 1 tablet by mouth daily at 12 noon.    [provider]  senna-docusate (SENOKOT-S) 8.6-50 MG tablet Take 2 tablets by mouth daily. Patient not taking: Reported on 11/15/2018 10/13/18   Jamie Brookes L, DO  topiramate (TOPAMAX) 100 MG tablet Take 3 tablets (300 mg total) by mouth 2 (two) times daily. 01/16/19   Levert Feinstein, MD  zolpidem (AMBIEN) 10 MG tablet Take 1 tablet (10 mg total) by mouth at bedtime as needed for sleep. 04/28/19   Myrlene Broker, MD    Family History Family History  Problem Relation Age of Onset   Bipolar disorder Mother    Alcohol abuse Mother    Depression Sister    Anxiety disorder Sister    Alcohol abuse Maternal Grandfather    Diabetes Maternal Grandfather    Heart attack Maternal Grandfather    Alcohol abuse Paternal Grandfather    Heart disease Paternal Grandfather    Other Father        unsure of  history   Asthma Son    Other Son        EOE   Colon cancer Maternal Grandmother     Social History Social History   Tobacco Use   Smoking status: Current Every Day Smoker    Packs/day: 1.00   Smokeless tobacco: Never Used  Substance Use Topics   Alcohol use: Not Currently    Comment: occ., 05-07-2017 per pt 2-3 times a mth   Drug use: No     Allergies   Adhesive [tape], Carbatrol [carbamazepine], Dilantin [phenytoin sodium extended], Tizanidine hcl, and Venlafaxine   Review of Systems Review of Systems  Constitutional: Negative for activity change and appetite change.  HENT: Negative for congestion, ear discharge, ear pain, facial swelling, nosebleeds, rhinorrhea, sneezing and tinnitus.   Eyes: Negative for photophobia, pain and discharge.  Respiratory: Negative for cough, choking, shortness of breath and wheezing.   Cardiovascular: Negative for chest pain, palpitations and leg swelling.  Gastrointestinal: Negative for abdominal pain, blood in stool, constipation, diarrhea, nausea and vomiting.  Genitourinary: Negative for difficulty urinating, dysuria, flank pain, frequency and hematuria.  Musculoskeletal: Negative for back pain, gait problem, myalgias and neck pain.  Skin: Positive for wound. Negative for color change and rash.  Neurological: Negative for dizziness, seizures, syncope, facial asymmetry, speech difficulty, weakness and numbness.  Hematological: Negative for adenopathy. Does not bruise/bleed easily.  Psychiatric/Behavioral: Negative for agitation, confusion, hallucinations, self-injury and suicidal ideas. The patient is not nervous/anxious.      Physical Exam Updated Vital Signs BP 96/61 (BP Location: Right Arm)    Pulse 81    Temp 97.9 F (36.6 C) (Oral)    Ht 5\' 5"  (1.651 m)    LMP 05/08/2019    SpO2 100%    BMI 24.16 kg/m   Physical Exam Vitals signs and nursing note reviewed.  Constitutional:      Appearance: She is well-developed. She is  not toxic-appearing.  HENT:     Head: Normocephalic.     Right Ear: Tympanic membrane and external ear normal.     Left Ear: Tympanic membrane and external ear normal.  Eyes:     General: Lids are normal.     Pupils: Pupils are equal, round, and reactive to light.  Neck:     Musculoskeletal: Normal range of motion and neck supple.     Vascular: No carotid bruit.  Cardiovascular:     Rate and Rhythm: Normal rate and regular  rhythm.     Pulses: Normal pulses.     Heart sounds: Normal heart sounds.  Pulmonary:     Effort: No respiratory distress.     Breath sounds: Normal breath sounds.  Abdominal:     General: Bowel sounds are normal.     Palpations: Abdomen is soft.     Tenderness: There is no abdominal tenderness. There is no guarding.  Musculoskeletal: Normal range of motion.     Comments: There is a 0.6 cm puncture/laceration of the left mid anterior tibial area.  There is also an abrasion near the area.  There is no red streaks appreciated.  The area is not hot.  There is full range of motion of the left hip, knee, ankle, and toes.  Dorsalis pedis pulses 2+.  Lymphadenopathy:     Head:     Right side of head: No submandibular adenopathy.     Left side of head: No submandibular adenopathy.     Cervical: No cervical adenopathy.  Skin:    General: Skin is warm and dry.  Neurological:     Mental Status: She is alert and oriented to person, place, and time.     Cranial Nerves: No cranial nerve deficit.     Sensory: No sensory deficit.  Psychiatric:        Speech: Speech normal.      ED Treatments / Results  Labs (all labs ordered are listed, but only abnormal results are displayed) Labs Reviewed - No data to display  EKG None  Radiology Dg Tibia/fibula Left  Result Date: 05/29/2019 CLINICAL DATA:  Lower leg injury. EXAM: LEFT TIBIA AND FIBULA - 2 VIEW COMPARISON:  No prior. FINDINGS: No acute soft tissue bony abnormality. No fracture or dislocation. No radiopaque  foreign body. IMPRESSION: No acute abnormality. Electronically Signed   By: Maisie Fushomas  Register   On: 05/29/2019 14:19    Procedures Procedures (including critical care time)  Medications Ordered in ED Medications  neomycin-bacitracin-polymyxin (NEOSPORIN) ointment packet (has no administration in time range)  Tdap (BOOSTRIX) injection 0.5 mL (0.5 mLs Intramuscular Given 05/29/19 1501)  doxycycline (VIBRA-TABS) tablet 100 mg (100 mg Oral Given 05/29/19 1547)  ibuprofen (ADVIL) tablet 400 mg (400 mg Oral Given 05/29/19 1548)  acetaminophen (TYLENOL) tablet 1,000 mg (1,000 mg Oral Given 05/29/19 1547)     Initial Impression / Assessment and Plan / ED Course  I have reviewed the triage vital signs and the nursing notes.  Pertinent labs & imaging results that were available during my care of the patient were reviewed by me and considered in my medical decision making (see chart for details).          Final Clinical Impressions(s) / ED Diagnoses MDM  Vital signs within normal limits.  Patient has a wound to the left shin.  No palpable foreign body appreciated.  No active bleeding.  Mild drainage from the area noted.  X-ray is negative for any fracture, dislocation, foreign body, or gas.  I have instructed the patient to cleanse the wound daily with soap and water, and to apply a fresh bandage daily.  The patient will be treated with Keflex daily.  The patient is to see the primary physician or return to the emergency department if any signs of advancing infection, problems, or concerns.   Final diagnoses:  Puncture wound of left lower leg, initial encounter    ED Discharge Orders         Ordered    doxycycline (VIBRAMYCIN)  100 MG capsule  2 times daily     05/29/19 1559           Ivery QualeBryant, Icis Budreau, PA-C 05/29/19 1604    Raeford RazorKohut, Stephen, MD 05/30/19 [phone]

## 2019-05-29 NOTE — ED Triage Notes (Signed)
Pt was mowing the yard 2 days ago and was hit in the shin by an object. Not sure what she was hit by. Believes she is up to date on Tetanus vaccine.

## 2019-08-10 ENCOUNTER — Encounter (HOSPITAL_COMMUNITY): Payer: Self-pay

## 2019-08-10 ENCOUNTER — Emergency Department (HOSPITAL_COMMUNITY)
Admission: EM | Admit: 2019-08-10 | Discharge: 2019-08-10 | Disposition: A | Discharge status: Home / Self Care | Payer: Medicare Other | Attending: Emergency Medicine | Admitting: Emergency Medicine

## 2019-08-10 ENCOUNTER — Other Ambulatory Visit: Payer: Self-pay

## 2019-08-10 DIAGNOSIS — Y929 Unspecified place or not applicable: Secondary | ICD-10-CM | POA: Insufficient documentation

## 2019-08-10 DIAGNOSIS — X58XXXA Exposure to other specified factors, initial encounter: Secondary | ICD-10-CM | POA: Insufficient documentation

## 2019-08-10 DIAGNOSIS — T17920A Food in respiratory tract, part unspecified causing asphyxiation, initial encounter: Secondary | ICD-10-CM | POA: Diagnosis not present

## 2019-08-10 DIAGNOSIS — Z20828 Contact with and (suspected) exposure to other viral communicable diseases: Secondary | ICD-10-CM | POA: Insufficient documentation

## 2019-08-10 DIAGNOSIS — T18128A Food in esophagus causing other injury, initial encounter: Secondary | ICD-10-CM

## 2019-08-10 DIAGNOSIS — F17210 Nicotine dependence, cigarettes, uncomplicated: Secondary | ICD-10-CM | POA: Diagnosis not present

## 2019-08-10 DIAGNOSIS — Y999 Unspecified external cause status: Secondary | ICD-10-CM | POA: Diagnosis not present

## 2019-08-10 DIAGNOSIS — Z79899 Other long term (current) drug therapy: Secondary | ICD-10-CM | POA: Insufficient documentation

## 2019-08-10 DIAGNOSIS — Y9389 Activity, other specified: Secondary | ICD-10-CM | POA: Insufficient documentation

## 2019-08-10 DIAGNOSIS — R1111 Vomiting without nausea: Secondary | ICD-10-CM | POA: Diagnosis not present

## 2019-08-10 DIAGNOSIS — Z03818 Encounter for observation for suspected exposure to other biological agents ruled out: Secondary | ICD-10-CM | POA: Diagnosis not present

## 2019-08-10 DIAGNOSIS — K222 Esophageal obstruction: Secondary | ICD-10-CM

## 2019-08-10 LAB — SARS CORONAVIRUS 2 BY RT PCR (HOSPITAL ORDER, PERFORMED IN ~~LOC~~ HOSPITAL LAB): SARS Coronavirus 2: NEGATIVE

## 2019-08-10 MED ORDER — KETOROLAC TROMETHAMINE 15 MG/ML IJ SOLN
15.0000 mg | Freq: Once | INTRAMUSCULAR | Status: AC
  Administered 2019-08-10: 15 mg via INTRAVENOUS
  Filled 2019-08-10: qty 1

## 2019-08-10 MED ORDER — GLUCAGON HCL RDNA (DIAGNOSTIC) 1 MG IJ SOLR
1.0000 mg | Freq: Once | INTRAMUSCULAR | Status: AC
  Administered 2019-08-10: 1 mg via INTRAVENOUS
  Filled 2019-08-10: qty 1

## 2019-08-10 NOTE — Discharge Instructions (Signed)
Please follow up with Gastroenterology for your swallowing difficulties Return if worsening

## 2019-08-10 NOTE — ED Triage Notes (Signed)
Pt reports eating steak at [employer] and stuck in throat. Vomiting any liquid she swallows

## 2019-08-10 NOTE — ED Notes (Signed)
PT vomited moderate amount of food particles at this time and verbalized relief from foreign object felt in her throat. PT requesting medications for headache. PA made aware. Will continue to monitor.

## 2019-08-10 NOTE — ED Provider Notes (Signed)
[facility] EMERGENCY DEPARTMENT Provider Note   CSN: [id number] Arrival date & time: 08/10/19  1827     History   Chief Complaint Chief Complaint  Patient presents with  . Foreign Body    HPI Loretta Padilla is a 33 y.o. female with history of epilepsy who presents with food impaction. She states she was eating a carne asada burrito from [employer] about an hour ago and it is stuck in her throat. She has tried to throw up and tried to drink fluids without relief. This has happened in the past but she has been able to clear it on her own. She is not able to tolerate her oral secretions. She has never had an endoscopy or had to see GI. She denies CP/SOB but will have SOB if she tries to drink anything.     HPI  Past Medical History:  Diagnosis Date  . Anxiety   . Back pain   . Depression   . Kidney stones   . Seizures [facility])     Patient Active Problem List   Diagnosis Date Noted  . History of bilateral tubal ligation 11/15/2018  . Insomnia 03/15/2018  . Epilepsy ([facility]) 11/23/2017  . Smoker 08/03/2017  . Depression 05/07/2017    Past Surgical History:  Procedure Laterality Date  . IMPLANTATION VAGAL NERVE STIMULATOR    . TUBAL LIGATION N/A 10/10/2018   Procedure: POST PARTUM TUBAL LIGATION;  Surgeon: Hermina Staggers, MD;  Location: [facility];  Service: Gynecology;  Laterality: N/A;  . VSD REPAIR       OB History    Gravida  8   Para  4   Term  4   Preterm      AB  4   Living  4     SAB  2   TAB  2   Ectopic      Multiple  0   Live Births  4            Home Medications    Prior to Admission medications   Medication Sig Start Date End Date Taking? Authorizing Provider  ARIPiprazole (ABILIFY) 2 MG tablet Take 1 tablet (2 mg total) by mouth daily. 04/28/19   Myrlene Broker, MD  clonazePAM (KLONOPIN) 0.5 MG tablet Take 1 tablet (0.5 mg total) by mouth 2 (two) times daily as needed for anxiety. 04/28/19 04/27/20  Myrlene Broker, MD   cyclobenzaprine (FLEXERIL) 10 MG tablet Take 1 tablet (10 mg total) by mouth 3 (three) times daily as needed for muscle spasms. 08/02/18   Oralia Manis, DO  doxycycline (VIBRAMYCIN) 100 MG capsule Take 1 capsule (100 mg total) by mouth 2 (two) times daily. 05/29/19   Ivery Quale, PA-C  FLUoxetine (PROZAC) 20 MG capsule Take 1 capsule (20 mg total) by mouth 2 (two) times daily. 04/28/19   Myrlene Broker, MD  fluticasone (FLONASE) 50 MCG/ACT nasal spray Place 1 spray into both nostrils daily. 05/27/18   Petrucelli, Samantha R, PA-C  ibuprofen (ADVIL,MOTRIN) 600 MG tablet Take 1 tablet (600 mg total) by mouth every 6 (six) hours. Patient not taking: Reported on 11/15/2018 10/12/18   Standley Brooking, DO  oxymetazoline (AFRIN) 0.05 % nasal spray Place 1 spray into both nostrils 2 (two) times daily as needed for congestion.     [provider]  Prenatal Vit-Fe Fumarate-FA (MULTIVITAMIN-PRENATAL) 27-0.8 MG TABS tablet Take 1 tablet by mouth daily at 12 noon.    [provider]  senna-docusate (SENOKOT-S) 8.6-50 MG tablet Take 2 tablets by mouth daily. Patient not taking: Reported on 11/15/2018 10/13/18   Jamie BrookesKellogg, Alexandra L, DO  topiramate (TOPAMAX) 100 MG tablet Take 3 tablets (300 mg total) by mouth 2 (two) times daily. 01/16/19   Levert FeinsteinYan, Yijun, MD  zolpidem (AMBIEN) 10 MG tablet Take 1 tablet (10 mg total) by mouth at bedtime as needed for sleep. 04/28/19   Myrlene Brokeross, Deborah R, MD    Family History Family History  Problem Relation Age of Onset  . Bipolar disorder Mother   . Alcohol abuse Mother   . Depression Sister   . Anxiety disorder Sister   . Alcohol abuse Maternal Grandfather   . Diabetes Maternal Grandfather   . Heart attack Maternal Grandfather   . Alcohol abuse Paternal Grandfather   . Heart disease Paternal Grandfather   . Other Father        unsure of history  . Asthma Son   . Other Son        EOE  . Colon cancer Maternal Grandmother     Social History Social  History   Tobacco Use  . Smoking status: Current Every Day Smoker    Packs/day: 1.00  . Smokeless tobacco: Never Used  Substance Use Topics  . Alcohol use: Not Currently    Comment: occ., 05-07-2017 per pt 2-3 times a mth  . Drug use: No     Allergies   Adhesive [tape], Carbatrol [carbamazepine], Dilantin [phenytoin sodium extended], Tizanidine hcl, and Venlafaxine   Review of Systems Review of Systems  Respiratory: Negative for shortness of breath.   Cardiovascular: Negative for chest pain.  Gastrointestinal: Positive for vomiting.       +food impaction  All other systems reviewed and are negative.    Physical Exam Updated Vital Signs BP 113/87 (BP Location: Right Arm)   Pulse 94   Temp 98.3 F (36.8 C) (Oral)   Resp 18   LMP 08/01/2019   SpO2 100%   Physical Exam Vitals signs and nursing note reviewed.  Constitutional:      General: She is not in acute distress.    Appearance: Normal appearance. She is well-developed. She is not ill-appearing.     Comments: Calm, cooperative. Spitting up in emesis bag  HENT:     Head: Normocephalic and atraumatic.  Eyes:     General: No scleral icterus.       Right eye: No discharge.        Left eye: No discharge.     Conjunctiva/sclera: Conjunctivae normal.     Pupils: Pupils are equal, round, and reactive to light.  Neck:     Musculoskeletal: Normal range of motion.  Cardiovascular:     Rate and Rhythm: Normal rate and regular rhythm.  Pulmonary:     Effort: Pulmonary effort is normal. No respiratory distress.     Breath sounds: Normal breath sounds.  Abdominal:     General: There is no distension.  Skin:    General: Skin is warm and dry.  Neurological:     Mental Status: She is alert and oriented to person, place, and time.  Psychiatric:        Behavior: Behavior normal.      ED Treatments / Results  Labs (all labs ordered are listed, but only abnormal results are displayed) Labs Reviewed  SARS CORONAVIRUS  2 BY RT PCR ([facility]    EKG None  Radiology No results  found.  Procedures Procedures (including critical care time)  Medications Ordered in ED Medications  glucagon (human recombinant) (GLUCAGEN) injection 1 mg (1 mg Intravenous Given 08/10/19 1856)  ketorolac (TORADOL) 15 MG/ML injection 15 mg (15 mg Intravenous Given 08/10/19 1925)     Initial Impression / Assessment and Plan / ED Course  I have reviewed the triage vital signs and the nursing notes.  Pertinent labs & imaging results that were available during my care of the patient were reviewed by me and considered in my medical decision making (see chart for details).  33 year old female with esophageal food impaction that occurred about an hour ago. Her vitals are normal. She is not able to tolerate oral secretions and has been vomiting. Will order rapid covid and dose of glucagon. Discussed with Dr. Gilford Raid.  7:13 PM Pt vomited up food after glucagon and feels like it cleared.   8:18 PM Rechecked pt. She is sleeping. She woke up and took sips of water and feels better. Will d/c with GI f/u.   Final Clinical Impressions(s) / ED Diagnoses   Final diagnoses:  Esophageal obstruction due to food impaction    ED Discharge Orders    None       Recardo Evangelist, PA-C 08/10/19 2019    Isla Pence, MD 08/10/19 2023

## 2019-08-10 NOTE — ED Notes (Signed)
Pt tolerated oral fluid intake well.

## 2019-08-18 ENCOUNTER — Other Ambulatory Visit (HOSPITAL_COMMUNITY): Payer: Self-pay | Admitting: Psychiatry

## 2019-08-28 ENCOUNTER — Ambulatory Visit (INDEPENDENT_AMBULATORY_CARE_PROVIDER_SITE_OTHER): Payer: Medicare Other | Admitting: Psychiatry

## 2019-08-28 ENCOUNTER — Other Ambulatory Visit: Payer: Self-pay

## 2019-08-28 ENCOUNTER — Encounter (HOSPITAL_COMMUNITY): Payer: Self-pay | Admitting: Psychiatry

## 2019-08-28 DIAGNOSIS — F331 Major depressive disorder, recurrent, moderate: Secondary | ICD-10-CM

## 2019-08-28 MED ORDER — ZOLPIDEM TARTRATE 10 MG PO TABS: 10.0000 mg | ORAL_TABLET | Freq: Every evening | ORAL | 2 refills | Status: DC | PRN

## 2019-08-28 MED ORDER — CLONAZEPAM 0.5 MG PO TABS: 0.5000 mg | ORAL_TABLET | Freq: Two times a day (BID) | ORAL | 2 refills | Status: DC | PRN

## 2019-08-28 MED ORDER — ARIPIPRAZOLE 2 MG PO TABS: 2.0000 mg | ORAL_TABLET | Freq: Every day | ORAL | 2 refills | Status: DC

## 2019-08-28 MED ORDER — FLUOXETINE HCL 20 MG PO CAPS: 20.0000 mg | ORAL_CAPSULE | Freq: Two times a day (BID) | ORAL | 2 refills | Status: DC
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
## 2019-11-02 ENCOUNTER — Other Ambulatory Visit: Payer: Self-pay

## 2019-11-02 ENCOUNTER — Encounter (HOSPITAL_COMMUNITY): Payer: Self-pay | Admitting: Psychiatry

## 2019-11-02 ENCOUNTER — Ambulatory Visit (INDEPENDENT_AMBULATORY_CARE_PROVIDER_SITE_OTHER): Payer: Medicare Other | Admitting: Psychiatry

## 2019-11-02 DIAGNOSIS — F331 Major depressive disorder, recurrent, moderate: Secondary | ICD-10-CM

## 2019-11-02 MED ORDER — ZOLPIDEM TARTRATE 10 MG PO TABS: 10.0000 mg | ORAL_TABLET | Freq: Every evening | ORAL | 2 refills | Status: DC | PRN

## 2019-11-02 MED ORDER — CLONAZEPAM 0.5 MG PO TABS: 0.5000 mg | ORAL_TABLET | Freq: Two times a day (BID) | ORAL | 2 refills | Status: DC | PRN

## 2019-11-02 MED ORDER — ARIPIPRAZOLE 2 MG PO TABS: 2.0000 mg | ORAL_TABLET | Freq: Every day | ORAL | 2 refills | Status: DC

## 2019-11-02 MED ORDER — FLUOXETINE HCL 20 MG PO CAPS: 20.0000 mg | ORAL_CAPSULE | Freq: Three times a day (TID) | ORAL | 2 refills | Status: DC
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
## 2019-11-16 ENCOUNTER — Encounter (HOSPITAL_COMMUNITY): Payer: Self-pay

## 2019-11-16 ENCOUNTER — Other Ambulatory Visit: Payer: Self-pay

## 2019-11-16 ENCOUNTER — Emergency Department (HOSPITAL_COMMUNITY)
Admission: EM | Admit: 2019-11-16 | Discharge: 2019-11-16 | Disposition: A | Discharge status: Home / Self Care | Payer: Medicare Other | Attending: Emergency Medicine | Admitting: Emergency Medicine

## 2019-11-16 ENCOUNTER — Emergency Department (HOSPITAL_COMMUNITY): Payer: Medicare Other

## 2019-11-16 DIAGNOSIS — Y999 Unspecified external cause status: Secondary | ICD-10-CM | POA: Diagnosis not present

## 2019-11-16 DIAGNOSIS — M5030 Other cervical disc degeneration, unspecified cervical region: Secondary | ICD-10-CM | POA: Diagnosis not present

## 2019-11-16 DIAGNOSIS — W010XXA Fall on same level from slipping, tripping and stumbling without subsequent striking against object, initial encounter: Secondary | ICD-10-CM | POA: Insufficient documentation

## 2019-11-16 DIAGNOSIS — M545 Low back pain: Secondary | ICD-10-CM | POA: Diagnosis present

## 2019-11-16 DIAGNOSIS — F17200 Nicotine dependence, unspecified, uncomplicated: Secondary | ICD-10-CM | POA: Insufficient documentation

## 2019-11-16 DIAGNOSIS — Z79899 Other long term (current) drug therapy: Secondary | ICD-10-CM | POA: Diagnosis not present

## 2019-11-16 DIAGNOSIS — M47816 Spondylosis without myelopathy or radiculopathy, lumbar region: Secondary | ICD-10-CM | POA: Insufficient documentation

## 2019-11-16 DIAGNOSIS — Y939 Activity, unspecified: Secondary | ICD-10-CM | POA: Insufficient documentation

## 2019-11-16 DIAGNOSIS — W19XXXA Unspecified fall, initial encounter: Secondary | ICD-10-CM

## 2019-11-16 DIAGNOSIS — R519 Headache, unspecified: Secondary | ICD-10-CM | POA: Insufficient documentation

## 2019-11-16 DIAGNOSIS — Y929 Unspecified place or not applicable: Secondary | ICD-10-CM | POA: Diagnosis not present

## 2019-11-16 LAB — POC URINE PREG, ED: Preg Test, Ur: NEGATIVE

## 2019-11-16 MED ORDER — HYDROCODONE-ACETAMINOPHEN 5-325 MG PO TABS: 1.0000 | ORAL_TABLET | ORAL | 0 refills | Status: DC | PRN

## 2019-11-16 MED ORDER — HYDROCODONE-ACETAMINOPHEN 5-325 MG PO TABS
1.0000 | ORAL_TABLET | Freq: Once | ORAL | Status: AC
  Administered 2019-11-16: 1 via ORAL
  Filled 2019-11-16: qty 1

## 2019-11-16 MED ORDER — PREDNISONE 10 MG PO TABS: ORAL_TABLET | ORAL | 0 refills | Status: DC

## 2019-11-16 NOTE — ED Notes (Signed)
Pt verbalized understanding of no driving and to use caution within 4 hours of taking pain meds due to meds cause drowsiness 

## 2019-11-16 NOTE — ED Notes (Signed)
Pt with pain from neck all way down her back since her trip yesterday, also with tingling to fingers on bilateral hands. Denies any incont. Pt states she hit her head on the dresser, denies any LOC or emesis.

## 2019-11-16 NOTE — Discharge Instructions (Addendum)
Do not drive within 4 hours of taking hydrocodone as this will make you drowsy.  Avoid lifting,  Bending,  Twisting or any other activity that worsens your pain over the next week.  Apply a heating pad to your neck and back for 20 minutes several times daily.   You should get rechecked if your symptoms are not better over the next 10 days,  Or you develop increased pain,  Weakness in your arms, leg(s) or loss of bladder or bowel function - these are symptoms of a worsening condition.

## 2019-11-16 NOTE — ED Provider Notes (Signed)
[facility] Provider Note   CSN: [id number] Arrival date & time: 11/16/19  1025     History Chief Complaint  Patient presents with  . Fall    Loretta Padilla is a 34 y.o. female with a history of anxiety, seizure disorder and chronic low back pain presenting with worsened pain after a fall.  She had increased low back pain after working in the yard several days ago but then tripped over her toddler yesterday, fell backward, hit her head and neck against her dresser and now endorses pain along the length of her spine.  She denies LOC, has had no n/v, headaches or dizziness since the fall, she describes persistent tingling in her fingertips bilaterally since the fall.  She denies weakness in her arms or hands, denies lower extremity pain or weakness and no urinary or fecal incontinence or retention.  She has employed rest and ice packs since yesterday's fall.  The history is provided by the patient.       Past Medical History:  Diagnosis Date  . Anxiety   . Back pain   . Depression   . Kidney stones   . Seizures [facility])     Patient Active Problem List   Diagnosis Date Noted  . History of bilateral tubal ligation 11/15/2018  . Insomnia 03/15/2018  . Epilepsy ([place]) 11/23/2017  . Smoker 08/03/2017  . Depression 05/07/2017    Past Surgical History:  Procedure Laterality Date  . IMPLANTATION VAGAL NERVE STIMULATOR    . TUBAL LIGATION N/A 10/10/2018   Procedure: POST PARTUM TUBAL LIGATION;  Surgeon: Chancy Milroy, MD;  Location: [facility];  Service: Gynecology;  Laterality: N/A;  . VSD REPAIR       OB History    Gravida  8   Para  4   Term  4   Preterm      AB  4   Living  4     SAB  2   TAB  2   Ectopic      Multiple  0   Live Births  4           Family History  Problem Relation Age of Onset  . Bipolar disorder Mother   . Alcohol abuse Mother   . Depression Sister   . Anxiety disorder Sister   . Alcohol abuse  Maternal Grandfather   . Diabetes Maternal Grandfather   . Heart attack Maternal Grandfather   . Alcohol abuse Paternal Grandfather   . Heart disease Paternal Grandfather   . Other Father        unsure of history  . Asthma Son   . Other Son        EOE  . Colon cancer Maternal Grandmother     Social History   Tobacco Use  . Smoking status: Current Every Day Smoker    Packs/day: 1.00  . Smokeless tobacco: Never Used  Substance Use Topics  . Alcohol use: Not Currently    Comment: occ., 05-07-2017 per pt 2-3 times a mth  . Drug use: No    Home Medications Prior to Admission medications   Medication Sig Start Date End Date Taking? Authorizing Provider  ARIPiprazole (ABILIFY) 2 MG tablet Take 1 tablet (2 mg total) by mouth daily. 11/02/19   Cloria Spring, MD  clonazePAM (KLONOPIN) 0.5 MG tablet Take 1 tablet (0.5 mg total) by mouth 2 (two) times daily as needed. for anxiety 11/02/19   Levonne Spiller  R, MD  FLUoxetine (PROZAC) 20 MG capsule Take 1 capsule (20 mg total) by mouth 3 (three) times daily. 11/02/19   Myrlene Broker, MD  fluticasone (FLONASE) 50 MCG/ACT nasal spray Place 1 spray into both nostrils daily. 05/27/18   Petrucelli, Samantha R, PA-C  HYDROcodone-acetaminophen (NORCO/VICODIN) 5-325 MG tablet Take 1 tablet by mouth every 4 (four) hours as needed. 11/16/19   Ketara Cavness, Raynelle Fanning, PA-C  predniSONE (DELTASONE) 10 MG tablet Take 6 tablets day one, 5 tablets day two, 4 tablets day three, 3 tablets day four, 2 tablets day five, then 1 tablet day six 11/16/19   Chenise Mulvihill, Raynelle Fanning, PA-C  topiramate (TOPAMAX) 100 MG tablet Take 3 tablets (300 mg total) by mouth 2 (two) times daily. 01/16/19   Levert Feinstein, MD  zolpidem (AMBIEN) 10 MG tablet Take 1 tablet (10 mg total) by mouth at bedtime as needed. for sleep 11/02/19   Myrlene Broker, MD    Allergies    Adhesive [tape], Carbatrol [carbamazepine], Dilantin [phenytoin sodium extended], Tizanidine hcl, and Venlafaxine  Review of Systems   Review of  Systems  Constitutional: Negative for fever.  Respiratory: Negative for shortness of breath.   Cardiovascular: Negative for chest pain and leg swelling.  Gastrointestinal: Negative for abdominal distention, abdominal pain and constipation.  Genitourinary: Negative for difficulty urinating, dysuria, flank pain, frequency and urgency.  Musculoskeletal: Positive for back pain. Negative for gait problem and joint swelling.  Skin: Negative for rash.  Neurological: Positive for numbness. Negative for dizziness, weakness and headaches.    Physical Exam Updated Vital Signs BP 95/64 (BP Location: Right Arm)   Pulse (!) 57   Temp 98.4 F (36.9 C) (Oral)   Resp 14   Ht 5\' 5"  (1.651 m)   Wt 65.3 kg   LMP 10/29/2019   SpO2 100%   BMI 23.96 kg/m   Physical Exam Vitals and nursing note reviewed.  Constitutional:      Appearance: She is well-developed.  HENT:     Head: Normocephalic.     Comments: Soreness to left parietal scalp, no hematoma, no palpable deformity or hematoma. Eyes:     Conjunctiva/sclera: Conjunctivae normal.  Cardiovascular:     Rate and Rhythm: Normal rate.     Comments: Pedal pulses normal. Pulmonary:     Effort: Pulmonary effort is normal.  Abdominal:     General: Bowel sounds are normal. There is no distension.     Palpations: Abdomen is soft. There is no mass.  Musculoskeletal:     Cervical back: Neck supple. Tenderness and bony tenderness present. No deformity. Normal range of motion.     Lumbar back: Tenderness present. No swelling, edema or spasms.     Comments: Both midline cervical spine spine tenderness along with paracervical soft tissue tenderness.  No specific muscle spasm appreciated.  No deformity.  Parathoracic and paralumbar tenderness without midline pain.  Skin:    General: Skin is warm and dry.  Neurological:     Mental Status: She is alert.     Sensory: Sensory deficit present.     Motor: No tremor or atrophy.     Gait: Gait normal.      Deep Tendon Reflexes:     Reflex Scores:      Bicep reflexes are 2+ on the right side and 2+ on the left side.    Comments: No strength deficit noted in hip, knee, wrist or forearm flexor and extensor muscle groups.  Ankle flexion and extension intact.  Decreased sensation to fine touch in all fingertips.  There is less than 2-second cap refill.  Radial pulses are intact.  She has full and equal bilateral grip strength.     ED Results / Procedures / Treatments   Labs (all labs ordered are listed, but only abnormal results are displayed) Labs Reviewed  POC URINE PREG, ED    EKG None  Radiology DG Thoracic Spine W/Swimmers  Result Date: 11/16/2019 CLINICAL DATA:  Dorsalgia EXAM: THORACIC SPINE - 3 VIEWS COMPARISON:  None. FINDINGS: Frontal, lateral, and swimmer's views were obtained. No fracture or spondylolisthesis. Disc spaces appear normal. No erosive change or paraspinous lesion. Visualized lungs clear. Stimulator noted with leads extending into the anterior neck region for leftward approach. IMPRESSION: No fracture or spondylolisthesis.  No evident arthropathy. Electronically Signed   By: Bretta Bang III M.D.   On: 11/16/2019 12:32   DG Lumbar Spine Complete  Result Date: 11/16/2019 CLINICAL DATA:  Pain following recent fall EXAM: LUMBAR SPINE - COMPLETE 4+ VIEW COMPARISON:  None. FINDINGS: Frontal, lateral, spot lumbosacral lateral, and bilateral oblique views were obtained. There are 5 non-rib-bearing lumbar type vertebral bodies. Left T12 rib is hypoplastic. There is no fracture or spondylolisthesis. Disc spaces appear unremarkable. There is mild facet osteoarthritic change at L5-S1 bilaterally. Facets elsewhere appear normal. Tubal ligation clips present in the pelvis. IMPRESSION: Facet osteoarthritic change at L5-S1 bilaterally. No appreciable disc space narrowing. No fracture or spondylolisthesis. Electronically Signed   By: Bretta Bang III M.D.   On: 11/16/2019 12:33    CT Cervical Spine Wo Contrast  Result Date: 11/16/2019 CLINICAL DATA:  Neck pain and bilateral cervical radiculopathy. EXAM: CT CERVICAL SPINE WITHOUT CONTRAST TECHNIQUE: Multidetector CT imaging of the cervical spine was performed without intravenous contrast. Multiplanar CT image reconstructions were also generated. COMPARISON:  None. FINDINGS: Alignment: Minimal grade 1 anterolisthesis of C5-6 is noted secondary to posterior facet joint hypertrophy. Skull base and vertebrae: No acute fracture. No primary bone lesion or focal pathologic process. Soft tissues and spinal canal: No prevertebral fluid or swelling. No visible canal hematoma. Disc levels: Moderate degenerative disc disease is noted at C6-7 with anterior osteophyte formation. Upper chest: Negative. Other: Mild degenerative changes seen involving the posterior facet joint on the right at C5-6. IMPRESSION: Moderate degenerative disc disease is noted at C6-7. Mild degenerative joint disease is seen involving the right-sided posterior facet joint of C5-6. No other significant abnormality seen in the cervical spine. Electronically Signed   By: Lupita Raider M.D.   On: 11/16/2019 12:53    Procedures Procedures (including critical care time)  Medications Ordered in ED Medications  HYDROcodone-acetaminophen (NORCO/VICODIN) 5-325 MG per tablet 1 tablet (1 tablet Oral Given 11/16/19 1149)    ED Course  I have reviewed the triage vital signs and the nursing notes.  Pertinent labs & imaging results that were available during my care of the patient were reviewed by me and considered in my medical decision making (see chart for details).    MDM Rules/Calculators/A&P                      Imaging reviewed and discussed with pt.  No neuro deficit on exam or by history to suggest emergent or surgical presentation. Nonspecific fingertip numbness of all fingers, suspect will resolve with time and tx.  Prednisone taper, heat therapy, activities as  tolerated  Outlined worsened sx that should prompt immediate re-evaluation including distal weakness, bowel/bladder retention/incontinence. Prn f/u  pcp if not improved with tx.        Final Clinical Impression(s) / ED Diagnoses Final diagnoses:  Fall, initial encounter  Spondylosis of lumbar region without myelopathy or radiculopathy  DDD (degenerative disc disease), cervical    Rx / DC Orders ED Discharge Orders         Ordered    HYDROcodone-acetaminophen (NORCO/VICODIN) 5-325 MG tablet  Every 4 hours PRN     11/16/19 1329    predniSONE (DELTASONE) 10 MG tablet     11/16/19 1329           Victoriano Lain 11/17/19 2143    Pollyann Savoy, MD 11/18/19 [phone]

## 2019-11-16 NOTE — ED Triage Notes (Signed)
Pt was doing yard work a few days ago and began having back pain. Yesterday, she tripped over her son and is injured her back more. States pain in all of her spine. Ambulatory.

## 2019-11-27 ENCOUNTER — Ambulatory Visit (INDEPENDENT_AMBULATORY_CARE_PROVIDER_SITE_OTHER): Payer: Medicare Other | Admitting: Psychiatry

## 2019-11-27 ENCOUNTER — Other Ambulatory Visit: Payer: Self-pay

## 2019-11-27 ENCOUNTER — Encounter (HOSPITAL_COMMUNITY): Payer: Self-pay | Admitting: Psychiatry

## 2019-11-27 DIAGNOSIS — F331 Major depressive disorder, recurrent, moderate: Secondary | ICD-10-CM | POA: Diagnosis not present

## 2019-11-27 MED ORDER — FLUOXETINE HCL 20 MG PO CAPS: 20.0000 mg | ORAL_CAPSULE | Freq: Three times a day (TID) | ORAL | 2 refills | Status: DC

## 2019-11-27 MED ORDER — ZOLPIDEM TARTRATE 10 MG PO TABS: 10.0000 mg | ORAL_TABLET | Freq: Every evening | ORAL | 2 refills | Status: DC | PRN

## 2019-11-27 MED ORDER — CLONAZEPAM 0.5 MG PO TABS: 0.5000 mg | ORAL_TABLET | Freq: Two times a day (BID) | ORAL | 2 refills | Status: DC | PRN

## 2019-11-27 MED ORDER — ARIPIPRAZOLE 2 MG PO TABS: 2.0000 mg | ORAL_TABLET | Freq: Every day | ORAL | 2 refills | Status: DC

## 2019-11-27 NOTE — Progress Notes (Signed)
Virtual Visit via Video Note  I connected with Loretta Padilla on 11/27/19 at 10:00 AM EDT by a video enabled telemedicine application and verified that I am speaking with the correct person using two identifiers.   I discussed the limitations of evaluation and management by telemedicine and the availability of in person appointments. The patient expressed understanding and agreed to proceed.    I discussed the assessment and treatment plan with the patient. The patient was provided an opportunity to ask questions and all were answered. The patient agreed with the plan and demonstrated an understanding of the instructions.   The patient was advised to call back or seek an in-person evaluation if the symptoms worsen or if the condition fails to improve as anticipated.  I provided 15 minutes of non-face-to-face time during this encounter.   Levonne Spiller, MD  [facility] MD/PA/NP OP Progress Note  11/27/2019 10:26 AM Loretta Padilla  MRN:  [id number]  Chief Complaint:  Chief Complaint    Depression; Follow-up     HPI: This patient is a 34 year old divorced white female who lives with her children in [place].  She is on disability for seizure disorder.  The patient returns after 4 weeks.  Last time she was getting more depressed.  She was lonely because all of her children except her 39-year-old baby had moved out temporarily.  However her 43-year-old daughter is now back from visiting relatives and she is feeling somewhat better.  We had also increased her Prozac and her mood seems to be improving.  She is sleeping okay but has fallen and injured her back.  She was seen in the emergency room on 11/16/2019 with negative x-rays of the cervical spine and thoracic and lumbar areas.  However she is still in a lot of pain which is probably muscular.  She is going to her primary doctor today.  Her energy seems to be improving and she denies suicidal ideation Visit Diagnosis:    ICD-10-CM   1. Moderate  episode of recurrent major depressive disorder (HCC)  F33.1     Past Psychiatric History: Long-term outpatient treatment for depression  Past Medical History:  Past Medical History:  Diagnosis Date  . Anxiety   . Back pain   . Depression   . Kidney stones   . Seizures ([place])     Past Surgical History:  Procedure Laterality Date  . IMPLANTATION VAGAL NERVE STIMULATOR    . TUBAL LIGATION N/A 10/10/2018   Procedure: POST PARTUM TUBAL LIGATION;  Surgeon: Chancy Milroy, MD;  Location: [facility];  Service: Gynecology;  Laterality: N/A;  . VSD REPAIR      Family Psychiatric History: See below  Family History:  Family History  Problem Relation Age of Onset  . Bipolar disorder Mother   . Alcohol abuse Mother   . Depression Sister   . Anxiety disorder Sister   . Alcohol abuse Maternal Grandfather   . Diabetes Maternal Grandfather   . Heart attack Maternal Grandfather   . Alcohol abuse Paternal Grandfather   . Heart disease Paternal Grandfather   . Other Father        unsure of history  . Asthma Son   . Other Son        EOE  . Colon cancer Maternal Grandmother     Social History:  Social History   Socioeconomic History  . Marital status: Divorced    Spouse name: Not on file  . Number of children: 3  .  Years of education: 3 years college  . Highest education level: Not on file  Occupational History  . Occupation: Disabled  Tobacco Use  . Smoking status: Current Every Day Smoker    Packs/day: 1.00  . Smokeless tobacco: Never Used  Substance and Sexual Activity  . Alcohol use: Not Currently    Comment: occ., 05-07-2017 per pt 2-3 times a mth  . Drug use: No  . Sexual activity: Yes    Birth control/protection: None  Other Topics Concern  . Not on file  Social History Narrative   Lives at home with boyfriend and children.   Right-handed.   Occasional use of caffeine.      Social Determinants of Health   Financial Resource Strain:   . Difficulty of  Paying Living Expenses:   Food Insecurity:   . Worried About Programme researcher, broadcasting/film/video in the Last Year:   . Barista in the Last Year:   Transportation Needs:   . Freight forwarder (Medical):   Marland Kitchen Lack of Transportation (Non-Medical):   Physical Activity:   . Days of Exercise per Week:   . Minutes of Exercise per Session:   Stress:   . Feeling of Stress :   Social Connections:   . Frequency of Communication with Friends and Family:   . Frequency of Social Gatherings with Friends and Family:   . Attends Religious Services:   . Active Member of Clubs or Organizations:   . Attends Banker Meetings:   Marland Kitchen Marital Status:     Allergies:  Allergies  Allergen Reactions  . Adhesive [Tape]     Birth control patch and nicoderm patch   . Carbatrol [Carbamazepine] Rash  . Dilantin [Phenytoin Sodium Extended] Rash  . Tizanidine Hcl Rash  . Venlafaxine Rash    Metabolic Disorder Labs: No results found for: HGBA1C, MPG No results found for: PROLACTIN No results found for: CHOL, TRIG, HDL, CHOLHDL, VLDL, LDLCALC Lab Results  Component Value Date   TSH 1.310 01/18/2019    Therapeutic Level Labs: No results found for: LITHIUM No results found for: VALPROATE No components found for:  CBMZ  Current Medications: Current Outpatient Medications  Medication Sig Dispense Refill  . ARIPiprazole (ABILIFY) 2 MG tablet Take 1 tablet (2 mg total) by mouth daily. 30 tablet 2  . clonazePAM (KLONOPIN) 0.5 MG tablet Take 1 tablet (0.5 mg total) by mouth 2 (two) times daily as needed. for anxiety 60 tablet 2  . FLUoxetine (PROZAC) 20 MG capsule Take 1 capsule (20 mg total) by mouth 3 (three) times daily. 90 capsule 2  . fluticasone (FLONASE) 50 MCG/ACT nasal spray Place 1 spray into both nostrils daily. 16 g 0  . HYDROcodone-acetaminophen (NORCO/VICODIN) 5-325 MG tablet Take 1 tablet by mouth every 4 (four) hours as needed. 10 tablet 0  . predniSONE (DELTASONE) 10 MG tablet Take 6  tablets day one, 5 tablets day two, 4 tablets day three, 3 tablets day four, 2 tablets day five, then 1 tablet day six 21 tablet 0  . topiramate (TOPAMAX) 100 MG tablet Take 3 tablets (300 mg total) by mouth 2 (two) times daily. 540 tablet 4  . zolpidem (AMBIEN) 10 MG tablet Take 1 tablet (10 mg total) by mouth at bedtime as needed. for sleep 30 tablet 2   No current facility-administered medications for this visit.     Musculoskeletal: Strength & Muscle Tone: within normal limits Gait & Station: normal Patient leans: N/A  Psychiatric Specialty Exam: Review of Systems  Musculoskeletal: Positive for back pain.  Neurological: Positive for headaches.    Last menstrual period 10/29/2019, not currently breastfeeding.There is no height or weight on file to calculate BMI.  General Appearance: Casual and Fairly Groomed  Eye Contact:  Good  Speech:  Clear and Coherent  Volume:  Normal  Mood:  Euthymic  Affect:  Appropriate and Congruent  Thought Process:  Goal Directed  Orientation:  Full (Time, Place, and Person)  Thought Content: Rumination   Suicidal Thoughts:  No  Homicidal Thoughts:  No  Memory:  Immediate;   Good Recent;   Good Remote;   Good  Judgement:  Good  Insight:  Good  Psychomotor Activity:  Decreased  Concentration:  Concentration: Good and Attention Span: Good  Recall:  Good  Fund of Knowledge: Good  Language: Good  Akathisia:  No  Handed:  Right  AIMS (if indicated): not done  Assets:  Communication Skills Desire for Improvement Resilience Social Support Talents/Skills  ADL's:  Intact  Cognition: WNL  Sleep:  Fair   Screenings: PHQ2-9     Initial Prenatal from 03/15/2018 in [facility]  PHQ-2 Total Score  1  PHQ-9 Total Score  9       Assessment and Plan: This patient is a 34 year old female with a history of seizure disorder depression and anxiety.  She is doing better in terms of her depression but seems to be doing better now that her  daughter has returned and we increased her Prozac.  She will continue Prozac 60 mg daily for depression and Abilify 2 mg daily for augmentation, clonazepam 0.5 mg twice daily as needed for anxiety and Ambien 10 mg at bedtime only as needed for sleep.  She will return to see me in 2 months   Diannia Ruder, MD 11/27/2019, 10:26 AM

## 2019-12-05 ENCOUNTER — Ambulatory Visit (HOSPITAL_COMMUNITY): Payer: Medicare Other | Admitting: Psychiatry

## 2019-12-19 ENCOUNTER — Encounter: Payer: Self-pay | Admitting: *Deleted

## 2019-12-19 ENCOUNTER — Telehealth: Payer: Self-pay | Admitting: Neurology

## 2019-12-19 DIAGNOSIS — R569 Unspecified convulsions: Secondary | ICD-10-CM

## 2019-12-19 NOTE — Addendum Note (Signed)
Addended by: Levert Feinstein on: 12/19/2019 04:44 PM   Modules accepted: Orders

## 2019-12-19 NOTE — Telephone Encounter (Signed)
She is already on high dose of topiramate, will let her keep current dose, ordered EEG, please advise her to call back office for recurrent spells, and also emphasized importance of compliance  with her medications

## 2019-12-19 NOTE — Telephone Encounter (Signed)
pt is wanting to update Dr Terrace Arabia on some family medical history and is asking for a call to address other concerns as well.

## 2019-12-19 NOTE — Telephone Encounter (Signed)
I spoke to the patient and she verbalized understanding of the information below. EEG and follow up appts have been scheduled.

## 2019-12-19 NOTE — Telephone Encounter (Signed)
I spoke to the patient who reports that her father was diagnosed w/ a Glioblastoma yesterday. She is aware this type of cancer is typically non-hereditary but it has caused her some concern. She wants it noted in her family history and this has been updated in Epic.  She also feels she may have had a seizure in her sleep three nights ago. She woke up feeling dizzy, unaware of her surroundings, nausea and  Denies any missed medication. She is currently taking topiramate 100mg , three tablets BID. She was last seen in 01/2019. She has scheduled a follow up on 01/03/20. She would like to know if med changes should be made prior to her follow up or should she just monitor her events.

## 2020-01-03 ENCOUNTER — Ambulatory Visit: Payer: Self-pay | Admitting: Neurology

## 2020-01-10 ENCOUNTER — Ambulatory Visit (INDEPENDENT_AMBULATORY_CARE_PROVIDER_SITE_OTHER): Payer: Medicare Other | Admitting: Neurology

## 2020-01-10 ENCOUNTER — Other Ambulatory Visit: Payer: Self-pay

## 2020-01-10 DIAGNOSIS — R569 Unspecified convulsions: Secondary | ICD-10-CM

## 2020-01-11 NOTE — Procedures (Signed)
   HISTORY: 34 year old female with history of seizure  TECHNIQUE:  This is a routine 16 channel EEG recording with one channel devoted to a limited EKG recording.  It was performed during wakefulness, drowsiness and asleep.  Hyperventilation and photic stimulation were performed as activating procedures.  There are minimum muscle and movement artifact noted.  Upon maximum arousal, posterior dominant waking rhythm consistent of rhythmic alpha range activity, with frequency of 9 Hz hz. Activities are symmetric over the bilateral posterior derivations and attenuated with eye opening.  Hyperventilation produced mild/moderate buildup with higher amplitude and the slower activities noted.  Photic stimulation did not alter the tracing.  During EEG recording, patient developed drowsiness and entered sleep, sleep EEG demonstrated architecture, there were frontal centrally dominant vertex waves and symmetric sleep spindles noted.  During EEG recording, there was no epileptiform discharge noted.  EKG demonstrate sinus rhythm, with heart rate of 56 bpm  CONCLUSION: This is a  normal awake and asleep EEG.  There is no electrodiagnostic evidence of epileptiform discharge.  Levert Feinstein, M.D. Ph.D.  [facility] [address] [place], [place] [address] Phone: [phone] Fax:      [phone]

## 2020-01-17 ENCOUNTER — Telehealth (INDEPENDENT_AMBULATORY_CARE_PROVIDER_SITE_OTHER): Payer: Medicare Other | Admitting: Psychiatry

## 2020-01-17 ENCOUNTER — Encounter (HOSPITAL_COMMUNITY): Payer: Self-pay | Admitting: Psychiatry

## 2020-01-17 ENCOUNTER — Encounter: Payer: Self-pay | Admitting: Neurology

## 2020-01-17 ENCOUNTER — Telehealth: Payer: Self-pay

## 2020-01-17 ENCOUNTER — Other Ambulatory Visit: Payer: Self-pay

## 2020-01-17 ENCOUNTER — Ambulatory Visit: Payer: Self-pay | Admitting: Neurology

## 2020-01-17 DIAGNOSIS — F331 Major depressive disorder, recurrent, moderate: Secondary | ICD-10-CM

## 2020-01-17 MED ORDER — CLONAZEPAM 0.5 MG PO TABS: 0.5000 mg | ORAL_TABLET | Freq: Two times a day (BID) | ORAL | 2 refills | Status: DC | PRN

## 2020-01-17 MED ORDER — ARIPIPRAZOLE 2 MG PO TABS: 2.0000 mg | ORAL_TABLET | Freq: Every day | ORAL | 2 refills | Status: DC

## 2020-01-17 MED ORDER — ZOLPIDEM TARTRATE ER 12.5 MG PO TBCR: 12.5000 mg | EXTENDED_RELEASE_TABLET | Freq: Every evening | ORAL | 1 refills | Status: DC | PRN

## 2020-01-17 MED ORDER — FLUOXETINE HCL 20 MG PO CAPS: 20.0000 mg | ORAL_CAPSULE | Freq: Three times a day (TID) | ORAL | 2 refills | Status: DC

## 2020-01-17 NOTE — Telephone Encounter (Signed)
Pt no showed 01/17/2020 appt with Dr. Terrace Arabia.

## 2020-01-17 NOTE — Progress Notes (Signed)
Virtual Visit via Video Note  I connected with Loretta Padilla on 01/17/20 at 10:20 AM EDT by a video enabled telemedicine application and verified that I am speaking with the correct person using two identifiers.   I discussed the limitations of evaluation and management by telemedicine and the availability of in person appointments. The patient expressed understanding and agreed to proceed.     I discussed the assessment and treatment plan with the patient. The patient was provided an opportunity to ask questions and all were answered. The patient agreed with the plan and demonstrated an understanding of the instructions.   The patient was advised to call back or seek an in-person evaluation if the symptoms worsen or if the condition fails to improve as anticipated.  I provided 15 minutes of non-face-to-face time during this encounter.   Levonne Spiller, MD  [facility] MD/PA/NP OP Progress Note  01/17/2020 10:40 AM Loretta Padilla  MRN:  [id number]  Chief Complaint:  Chief Complaint    Depression; Anxiety; Follow-up     HPI: This patient is a 34 year old divorced white female who lives with her children in [place].  She is on disability for seizure disorder.  The patient returns for follow-up after 2 months.  She states that overall she is doing okay.  She is with a new boyfriend and he is helping her out financially.  She found out that her house needs a lot of work and is trying to earn money to pay for it.  Overall she states that her mood is good and she denies significant anxiety or panic attacks or thoughts of self-harm.  She finds that she is getting to sleep okay but waking up every night around 3 in the morning and cannot get back to sleep for a while.  I suggested that we change from Ambien to Ambien CR and she agrees.  The patient states that she is not using much caffeine does not nap during the day and tries to stay busy and active to get herself tired. Visit Diagnosis:   ICD-10-CM   1. Moderate episode of recurrent major depressive disorder (HCC)  F33.1     Past Psychiatric History: Long-term outpatient treatment for depression  Past Medical History:  Past Medical History:  Diagnosis Date  . Anxiety   . Back pain   . Depression   . Kidney stones   . Seizures ([place])     Past Surgical History:  Procedure Laterality Date  . IMPLANTATION VAGAL NERVE STIMULATOR    . TUBAL LIGATION N/A 10/10/2018   Procedure: POST PARTUM TUBAL LIGATION;  Surgeon: Chancy Milroy, MD;  Location: [facility];  Service: Gynecology;  Laterality: N/A;  . VSD REPAIR      Family Psychiatric History: see below  Family History:  Family History  Problem Relation Age of Onset  . Bipolar disorder Mother   . Alcohol abuse Mother   . Depression Sister   . Anxiety disorder Sister   . Alcohol abuse Maternal Grandfather   . Diabetes Maternal Grandfather   . Heart attack Maternal Grandfather   . Alcohol abuse Paternal Grandfather   . Heart disease Paternal Grandfather   . Other Father        unsure of history  . Brain cancer Father        Glioblastoma  . Asthma Son   . Other Son        EOE  . Colon cancer Maternal Grandmother     Social History:  Social History   Socioeconomic History  . Marital status: Divorced    Spouse name: Not on file  . Number of children: 3  . Years of education: 3 years college  . Highest education level: Not on file  Occupational History  . Occupation: Disabled  Tobacco Use  . Smoking status: Current Every Day Smoker    Packs/day: 1.00  . Smokeless tobacco: Never Used  Substance and Sexual Activity  . Alcohol use: Not Currently    Comment: occ., 05-07-2017 per pt 2-3 times a mth  . Drug use: No  . Sexual activity: Yes    Birth control/protection: None  Other Topics Concern  . Not on file  Social History Narrative   Lives at home with boyfriend and children.   Right-handed.   Occasional use of caffeine.      Social  Determinants of Health   Financial Resource Strain:   . Difficulty of Paying Living Expenses:   Food Insecurity:   . Worried About Programme researcher, broadcasting/film/video in the Last Year:   . Barista in the Last Year:   Transportation Needs:   . Freight forwarder (Medical):   Marland Kitchen Lack of Transportation (Non-Medical):   Physical Activity:   . Days of Exercise per Week:   . Minutes of Exercise per Session:   Stress:   . Feeling of Stress :   Social Connections:   . Frequency of Communication with Friends and Family:   . Frequency of Social Gatherings with Friends and Family:   . Attends Religious Services:   . Active Member of Clubs or Organizations:   . Attends Banker Meetings:   Marland Kitchen Marital Status:     Allergies:  Allergies  Allergen Reactions  . Adhesive [Tape]     Birth control patch and nicoderm patch   . Carbatrol [Carbamazepine] Rash  . Dilantin [Phenytoin Sodium Extended] Rash  . Tizanidine Hcl Rash  . Venlafaxine Rash    Metabolic Disorder Labs: No results found for: HGBA1C, MPG No results found for: PROLACTIN No results found for: CHOL, TRIG, HDL, CHOLHDL, VLDL, LDLCALC Lab Results  Component Value Date   TSH 1.310 01/18/2019    Therapeutic Level Labs: No results found for: LITHIUM No results found for: VALPROATE No components found for:  CBMZ  Current Medications: Current Outpatient Medications  Medication Sig Dispense Refill  . ARIPiprazole (ABILIFY) 2 MG tablet Take 1 tablet (2 mg total) by mouth daily. 30 tablet 2  . clonazePAM (KLONOPIN) 0.5 MG tablet Take 1 tablet (0.5 mg total) by mouth 2 (two) times daily as needed. for anxiety 60 tablet 2  . FLUoxetine (PROZAC) 20 MG capsule Take 1 capsule (20 mg total) by mouth 3 (three) times daily. 90 capsule 2  . fluticasone (FLONASE) 50 MCG/ACT nasal spray Place 1 spray into both nostrils daily. 16 g 0  . HYDROcodone-acetaminophen (NORCO/VICODIN) 5-325 MG tablet Take 1 tablet by mouth every 4 (four)  hours as needed. 10 tablet 0  . topiramate (TOPAMAX) 100 MG tablet Take 3 tablets (300 mg total) by mouth 2 (two) times daily. 540 tablet 4  . zolpidem (AMBIEN CR) 12.5 MG CR tablet Take 1 tablet (12.5 mg total) by mouth at bedtime as needed for sleep. 30 tablet 1   No current facility-administered medications for this visit.     Musculoskeletal: Strength & Muscle Tone: within normal limits Gait & Station: normal Patient leans: N/A  Psychiatric Specialty Exam: Review of Systems  Psychiatric/Behavioral: Positive for sleep disturbance.  All other systems reviewed and are negative.   not currently breastfeeding.There is no height or weight on file to calculate BMI.  General Appearance: Casual and Fairly Groomed  Eye Contact:  Good  Speech:  Clear and Coherent  Volume:  Normal  Mood:  Euthymic  Affect:  Appropriate and Congruent  Thought Process:  Goal Directed  Orientation:  Full (Time, Place, and Person)  Thought Content: WDL   Suicidal Thoughts:  No  Homicidal Thoughts:  No  Memory:  Immediate;   Good Recent;   Good Remote;   Good  Judgement:  Good  Insight:  Good  Psychomotor Activity:  Normal  Concentration:  Concentration: Good and Attention Span: Good  Recall:  Good  Fund of Knowledge: Good  Language: Good  Akathisia:  No  Handed:  Right  AIMS (if indicated): not done  Assets:  Communication Skills Desire for Improvement Resilience Social Support Talents/Skills  ADL's:  Intact  Cognition: WNL  Sleep:  Fair   Screenings: PHQ2-9     Initial Prenatal from 03/15/2018 in [facility]  PHQ-2 Total Score  1  PHQ-9 Total Score  9       Assessment and Plan: This patient is a 34 year old female with a history of seizure disorder depression and anxiety.  She states that her mood is stable and therefore we will continue Prozac 60 mg daily for depression and Abilify 2 mg daily for augmentation.  She will also continue clonazepam 0.5 mg twice daily for anxiety  and Ambien will be changed to Ambien CR 12.5 mg at bedtime to help sustain sleep.  She will return to see me in 3 months   Diannia Ruder, MD 01/17/2020, 10:40 AM

## 2020-02-02 ENCOUNTER — Telehealth (HOSPITAL_COMMUNITY): Payer: Self-pay | Admitting: *Deleted

## 2020-02-02 NOTE — Telephone Encounter (Signed)
I have filled out the PA paperwork numeous times and Jeanella Anton has faxed , check the chart records. I can not authorize this , it has to be authorized by insurance

## 2020-02-02 NOTE — Telephone Encounter (Signed)
Patient called stating that her pharmacy is needing approval from provider to go ahead and fill her 12.5 mg zolpidem. Per pt due to her waiting on the Prior Auth to be accepted, she went ahead and filled the 10 mg zolpidem that was on file so that she could sleep. Per pt the 10 mg is not working as good as the 12.5 and she needed something to help her sleep. Per pt she wanted to send message to provider to give pharmacy approval to fill her 12.5mg  Zolpidem please. Staff informed patient message will be sent to provider.

## 2020-02-02 NOTE — Telephone Encounter (Signed)
Prior Auth for Zolpidem Tartrate ER 12.5 was Approved per [facility].   Approval number is [id number]   May 27th   Staff called patient and informed her that she need to call her pharmacy and inform them that this medication was approved and patient verbalized understanding and agreed

## 2020-02-02 NOTE — Telephone Encounter (Signed)
Prior Auth for Zolpidem was approved but the pharmacy just need approval for them to fill the 12.5 mg because patient picked up the 10 mg while she was waiting for the 12.5 to be approved.

## 2020-02-06 NOTE — Telephone Encounter (Signed)
Spoke with pharmacist from [facility] and informed her with what provider stated and she verbalized understanding.

## 2020-02-06 NOTE — Telephone Encounter (Signed)
Okay, you can tell them to release it

## 2020-02-19 ENCOUNTER — Telehealth (HOSPITAL_COMMUNITY): Payer: Self-pay | Admitting: *Deleted

## 2020-02-19 NOTE — Telephone Encounter (Signed)
LMOM letting patient know that appt with Dr. Tenny Craw for August 12 will be resch to another date which is Aug 5 due to provider being out of the office for that week. Message was also sent to patient mychart.

## 2020-03-18 ENCOUNTER — Telehealth (HOSPITAL_COMMUNITY): Payer: Self-pay | Admitting: Psychiatry

## 2020-03-18 ENCOUNTER — Encounter: Payer: Self-pay | Admitting: Advanced Practice Midwife

## 2020-03-18 ENCOUNTER — Ambulatory Visit (INDEPENDENT_AMBULATORY_CARE_PROVIDER_SITE_OTHER): Payer: Medicare Other | Admitting: Advanced Practice Midwife

## 2020-03-18 ENCOUNTER — Other Ambulatory Visit: Payer: Self-pay

## 2020-03-18 VITALS — BP 104/68 | HR 66 | Ht 64.0 in | Wt 158.0 lb

## 2020-03-18 DIAGNOSIS — O9279 Other disorders of lactation: Secondary | ICD-10-CM | POA: Insufficient documentation

## 2020-03-18 DIAGNOSIS — N939 Abnormal uterine and vaginal bleeding, unspecified: Secondary | ICD-10-CM | POA: Insufficient documentation

## 2020-03-18 DIAGNOSIS — N6452 Nipple discharge: Secondary | ICD-10-CM

## 2020-03-18 DIAGNOSIS — N926 Irregular menstruation, unspecified: Secondary | ICD-10-CM | POA: Diagnosis not present

## 2020-03-18 LAB — POCT HEMOGLOBIN: Hemoglobin: 13.6 g/dL (ref 11–14.6)

## 2020-03-18 MED ORDER — SUMATRIPTAN SUCCINATE 100 MG PO TABS: 100.0000 mg | ORAL_TABLET | Freq: Once | ORAL | 0 refills | Status: DC | PRN

## 2020-03-18 NOTE — Telephone Encounter (Signed)
Patient calling advising her medications stopped working  over the weekend, would like to talk with you as soon as possible. Patient seemed sad. Appt scheduled for 7/13 at 8:40.

## 2020-03-18 NOTE — Telephone Encounter (Signed)
We will talk at appt

## 2020-03-18 NOTE — Progress Notes (Signed)
   GYN VISIT Patient name: Loretta Padilla MRN [id number]  Date of birth: 02-08-1986 Chief Complaint:   having irregular periods (dizzy, headaches; breasts leaking )  History of Present Illness:   Loretta Padilla is a 34 y.o. [phone]  female being seen today for multiple concerns: monthly cycles, but there is a break of bleeding in between and then then it starts up again (lasts a week total); when stimulated/squeezed can elicit milk droplets from breasts (has never breastfed); despite working out and eating healthily her abd 'still looks pregnant'; also states her depression is 'acting up'- has a psychiatrist, and she also currently has a migraine and is requesting medication    Depression screen [facility] 2/9 03/15/2018  Decreased Interest 1  Down, Depressed, Hopeless 0  PHQ - 2 Score 1  Altered sleeping 3  Tired, decreased energy 2  Change in appetite 3  Feeling bad or failure about yourself  0  Trouble concentrating 0  Moving slowly or fidgety/restless 0  Suicidal thoughts 0  PHQ-9 Score 9  Difficult doing work/chores Not difficult at all    Patient's last menstrual period was 02/25/2020. The current method of family planning is tubal ligation.  Last pap didn't discuss.  Review of Systems:   Pertinent items are noted in HPI Denies fever/chills, dizziness, headaches, visual disturbances, fatigue, shortness of breath, chest pain, abdominal pain, vomiting, abnormal vaginal discharge/itching/odor/irritation, problems with periods, bowel movements, urination, or intercourse unless otherwise stated above.  Pertinent History Reviewed:  Reviewed past medical,surgical, social, obstetrical and family history.  Reviewed problem list, medications and allergies. Physical Assessment:   Vitals:   03/18/20 1354  BP: 104/68  Pulse: 66  Weight: 158 lb (71.7 kg)  Height: 5\' 4"  (1.626 m)  Body mass index is 27.12 kg/m.       Physical Examination:   General appearance: alert, well appearing, and  in no distress  Mental status: alert, oriented to person, place, and time  Skin: warm & dry   Cardiovascular: normal heart rate noted  Respiratory: normal respiratory effort, no distress  Abdomen: soft, non-tender   Pelvic: normal external genitalia, vulva, vagina, cervix, uterus and adnexa  Extremities: no edema   Chaperone: n/a    Results for orders placed or performed in visit on 03/18/20 (from the past 24 hour(s))  POCT hemoglobin   Collection Time: 03/18/20  2:04 PM  Result Value Ref Range   Hemoglobin 13.6 11 - 14.6 g/dL    Assessment & Plan:  1) AUB> would like to hold off on hormonal methods to regulate cycles  2) Breasts producing milk> discussed can be normal after pregnancy, even for years after; will get TSH   3) Migraine, rx Imitrex  4) Unable to lose weight, discussed referral to [facility] for phentermine or to [facility]> she chooses 05/19/20  Meds:  Meds ordered this encounter  Medications  . SUMAtriptan (IMITREX) 100 MG tablet    Sig: Take 1 tablet (100 mg total) by mouth once as needed for up to 1 dose for migraine. May repeat in 2 hours if headache persists or recurs.    Dispense:  30 tablet    Refill:  0    Order Specific Question:   Supervising Provider    Answer:   Victorino Dike [[id number]]    Orders Placed This Encounter  Procedures  . TSH  . POCT hemoglobin    Return for 1st available; Tilda Burrow; weight loss.  Victorino Dike CNM 03/18/2020 2:20 PM

## 2020-03-19 ENCOUNTER — Encounter (HOSPITAL_COMMUNITY): Payer: Self-pay | Admitting: Psychiatry

## 2020-03-19 ENCOUNTER — Telehealth (INDEPENDENT_AMBULATORY_CARE_PROVIDER_SITE_OTHER): Payer: Medicare Other | Admitting: Psychiatry

## 2020-03-19 DIAGNOSIS — F331 Major depressive disorder, recurrent, moderate: Secondary | ICD-10-CM

## 2020-03-19 LAB — TSH: TSH: 2.37 u[IU]/mL (ref 0.450–4.500)

## 2020-03-19 MED ORDER — FLUOXETINE HCL 20 MG PO CAPS: 20.0000 mg | ORAL_CAPSULE | Freq: Three times a day (TID) | ORAL | 2 refills | Status: DC

## 2020-03-19 MED ORDER — CLONAZEPAM 0.5 MG PO TABS: 0.5000 mg | ORAL_TABLET | Freq: Two times a day (BID) | ORAL | 2 refills | Status: DC | PRN

## 2020-03-19 MED ORDER — ZOLPIDEM TARTRATE ER 12.5 MG PO TBCR: 12.5000 mg | EXTENDED_RELEASE_TABLET | Freq: Every evening | ORAL | 1 refills | Status: DC | PRN

## 2020-03-19 MED ORDER — CARIPRAZINE HCL 1.5 MG PO CAPS: 1.5000 mg | ORAL_CAPSULE | Freq: Every day | ORAL | 2 refills | Status: DC

## 2020-03-19 NOTE — Progress Notes (Signed)
Virtual Visit via Video Note  I connected with Loretta Padilla on 03/19/20 at  8:40 AM EDT by a video enabled telemedicine application and verified that I am speaking with the correct person using two identifiers.   I discussed the limitations of evaluation and management by telemedicine and the availability of in person appointments. The patient expressed understanding and agreed to proceed.    I discussed the assessment and treatment plan with the patient. The patient was provided an opportunity to ask questions and all were answered. The patient agreed with the plan and demonstrated an understanding of the instructions.   The patient was advised to call back or seek an in-person evaluation if the symptoms worsen or if the condition fails to improve as anticipated.  I provided 15 minutes of non-face-to-face time during this encounter. Location: Provider home, patient home  Diannia Ruder, MD  [facility] MD/PA/NP OP Progress Note  03/19/2020 8:56 AM Loretta Padilla  MRN:  [id number]  Chief Complaint:  Chief Complaint    Depression; Anxiety; Follow-up     HPI: This patient is a 34 year old divorced white female who lives with her children and boyfriend in [place].  She is on disability for seizure disorder.  The patient returns for follow-up after 2 months as a work in.  She had called yesterday stating that she did not think her medicines were working anymore.  Lately she has been increasingly moody irritable depressed.  Her moods will go from being pretty good and happy to plummeting down to feeling extremely depressed having significant crying low energy and no enjoyment.  She is not overtly suicidal but sometimes thinks it would better if she has not been born.  She is generally sleeping well and her appetite is okay.  The only recent stressor she can think of is the fact that her 63-year-old daughter has been diagnosed with ADHD ODD and possible bipolar disorder.  She is receiving therapy  and medication treatment.  Other than that her children are all back in the home with her aunt she states for the most part they are pretty well behaved her mother is there visiting and is very supportive and her boyfriend is also supportive.  We discussed numerous medication options.  Since she is having a significant mood swings rather than just depression I suggested we switch the Abilify to Vraylar which is good for both depression and mania and mixed state and she agrees to try it. Visit Diagnosis:    ICD-10-CM   1. Moderate episode of recurrent major depressive disorder (HCC)  F33.1     Past Psychiatric History: Long-term outpatient treatment for depression  Past Medical History:  Past Medical History:  Diagnosis Date  . Anxiety   . Back pain   . Depression   . Kidney stones   . Seizures ([facility])     Past Surgical History:  Procedure Laterality Date  . IMPLANTATION VAGAL NERVE STIMULATOR    . TUBAL LIGATION N/A 10/10/2018   Procedure: POST PARTUM TUBAL LIGATION;  Surgeon: Hermina Staggers, MD;  [facility];  Service: Gynecology;  Laterality: N/A;  . VSD REPAIR      Family Psychiatric History: see below  Family History:  Family History  Problem Relation Age of Onset  . Bipolar disorder Mother   . Alcohol abuse Mother   . Depression Sister   . Anxiety disorder Sister   . Alcohol abuse Maternal Grandfather   . Diabetes Maternal Grandfather   . Heart attack  Maternal Grandfather   . Alcohol abuse Paternal Grandfather   . Heart disease Paternal Grandfather   . Other Father        unsure of history  . Brain cancer Father        Glioblastoma  . Asthma Son   . Other Son        EOE  . Colon cancer Maternal Grandmother     Social History:  Social History   Socioeconomic History  . Marital status: Divorced    Spouse name: Not on file  . Number of children: 3  . Years of education: 3 years college  . Highest education level: Not on file  Occupational  History  . Occupation: Disabled  Tobacco Use  . Smoking status: Current Every Day Smoker    Packs/day: 1.00  . Smokeless tobacco: Never Used  Vaping Use  . Vaping Use: Never used  Substance and Sexual Activity  . Alcohol use: Not Currently    Comment: occ., 05-07-2017 per pt 2-3 times a mth  . Drug use: No  . Sexual activity: Yes    Birth control/protection: Surgical    Comment: tubal  Other Topics Concern  . Not on file  Social History Narrative   Lives at home with boyfriend and children.   Right-handed.   Occasional use of caffeine.      Social Determinants of Health   Financial Resource Strain:   . Difficulty of Paying Living Expenses:   Food Insecurity:   . Worried About Programme researcher, broadcasting/film/video in the Last Year:   . Barista in the Last Year:   Transportation Needs:   . Freight forwarder (Medical):   Marland Kitchen Lack of Transportation (Non-Medical):   Physical Activity:   . Days of Exercise per Week:   . Minutes of Exercise per Session:   Stress:   . Feeling of Stress :   Social Connections:   . Frequency of Communication with Friends and Family:   . Frequency of Social Gatherings with Friends and Family:   . Attends Religious Services:   . Active Member of Clubs or Organizations:   . Attends Banker Meetings:   Marland Kitchen Marital Status:     Allergies:  Allergies  Allergen Reactions  . Adhesive [Tape]     Birth control patch and nicoderm patch   . Carbatrol [Carbamazepine] Rash  . Dilantin [Phenytoin Sodium Extended] Rash  . Tizanidine Hcl Rash  . Venlafaxine Rash    Metabolic Disorder Labs: No results found for: HGBA1C, MPG No results found for: PROLACTIN No results found for: CHOL, TRIG, HDL, CHOLHDL, VLDL, LDLCALC Lab Results  Component Value Date   TSH 2.370 03/18/2020   TSH 1.310 01/18/2019    Therapeutic Level Labs: No results found for: LITHIUM No results found for: VALPROATE No components found for:  CBMZ  Current  Medications: Current Outpatient Medications  Medication Sig Dispense Refill  . cariprazine (VRAYLAR) capsule Take 1 capsule (1.5 mg total) by mouth daily. 30 capsule 2  . clonazePAM (KLONOPIN) 0.5 MG tablet Take 1 tablet (0.5 mg total) by mouth 2 (two) times daily as needed. for anxiety 60 tablet 2  . FLUoxetine (PROZAC) 20 MG capsule Take 1 capsule (20 mg total) by mouth 3 (three) times daily. 90 capsule 2  . fluticasone (FLONASE) 50 MCG/ACT nasal spray Place 1 spray into both nostrils daily. 16 g 0  . HYDROcodone-acetaminophen (NORCO/VICODIN) 5-325 MG tablet Take 1 tablet by mouth every 4 (  four) hours as needed. 10 tablet 0  . SUMAtriptan (IMITREX) 100 MG tablet Take 1 tablet (100 mg total) by mouth once as needed for up to 1 dose for migraine. May repeat in 2 hours if headache persists or recurs. 30 tablet 0  . topiramate (TOPAMAX) 100 MG tablet Take 3 tablets (300 mg total) by mouth 2 (two) times daily. 540 tablet 4  . zolpidem (AMBIEN CR) 12.5 MG CR tablet Take 1 tablet (12.5 mg total) by mouth at bedtime as needed for sleep. 30 tablet 1   No current facility-administered medications for this visit.     Musculoskeletal: Strength & Muscle Tone: within normal limits Gait & Station: normal Patient leans: N/A  Psychiatric Specialty Exam: Review of Systems  Psychiatric/Behavioral: Positive for dysphoric mood.  All other systems reviewed and are negative.   Last menstrual period 02/25/2020, not currently breastfeeding.There is no height or weight on file to calculate BMI.  General Appearance: Casual and Fairly Groomed  Eye Contact:  Good  Speech:  Clear and Coherent  Volume:  Decreased  Mood:  Depressed and Irritable  Affect:  Constricted and Flat  Thought Process:  Goal Directed  Orientation:  NA  Thought Content: Rumination   Suicidal Thoughts:  No  Homicidal Thoughts:  No  Memory:  Immediate;   Good Recent;   Good Remote;   Good  Judgement:  Good  Insight:  Good   Psychomotor Activity:  Decreased  Concentration:  Concentration: Fair and Attention Span: Fair  Recall:  Good  Fund of Knowledge: Good  Language: Good  Akathisia:  No  Handed:  Right  AIMS (if indicated): not done  Assets:  Communication Skills Desire for Improvement Resilience Social Support Talents/Skills  ADL's:  Intact  Cognition: WNL  Sleep:  Good   Screenings: PHQ2-9     Initial Prenatal from 03/15/2018 in [facility]  PHQ-2 Total Score 1  PHQ-9 Total Score 9       Assessment and Plan: This patient is a 34 year old female with a history of seizure disorder depression mood swings and anxiety.  Now that her mood is becoming more up-and-down it may be time to at a mood stabilizer that is more targeted.  She will did discontinue Abilify and start Vraylar 1.5 mg daily for mood swings.  For now she will continue Prozac 60 mg daily for depression, clonazepam 0.5 mg twice daily for anxiety and Ambien CR 12.5 mg at bedtime for sleep.  She will return to see me in 2 weeks or call sooner as needed   Diannia Ruder, MD 03/19/2020, 8:56 AM

## 2020-03-20 ENCOUNTER — Telehealth (HOSPITAL_COMMUNITY): Payer: Self-pay

## 2020-03-20 NOTE — Telephone Encounter (Signed)
Medication management - patient called stating she would need a prior authorization to fill her newly prescribed Vraylar.  Agreed our office would follow up on request.

## 2020-03-26 ENCOUNTER — Ambulatory Visit: Payer: Medicare Other | Admitting: Adult Health

## 2020-04-01 ENCOUNTER — Other Ambulatory Visit: Payer: Self-pay | Admitting: Neurology

## 2020-04-03 ENCOUNTER — Ambulatory Visit (INDEPENDENT_AMBULATORY_CARE_PROVIDER_SITE_OTHER): Payer: Medicare Other | Admitting: Adult Health

## 2020-04-03 ENCOUNTER — Encounter: Payer: Self-pay | Admitting: Adult Health

## 2020-04-03 VITALS — BP 98/69 | HR 68 | Ht 64.0 in | Wt 159.0 lb

## 2020-04-03 DIAGNOSIS — Z713 Dietary counseling and surveillance: Secondary | ICD-10-CM

## 2020-04-03 DIAGNOSIS — Z6827 Body mass index (BMI) 27.0-27.9, adult: Secondary | ICD-10-CM

## 2020-04-03 NOTE — Progress Notes (Signed)
  Subjective:     Patient ID: Loretta Padilla, female   DOB: 23-Apr-1986, 34 y.o.   MRN: [id number]  HPI Loretta Padilla is a 34 year old white female, divorced, [id number] in to discuss losing weight. Had normal TSH. PCP is Dr Chanetta Marshall.  Review of Systems Can't lose weight Reviewed past medical,surgical, social and family history. Reviewed medications and allergies.     Objective:   Physical Exam BP 98/69 (BP Location: Left Arm, Patient Position: Sitting, Cuff Size: Normal)   Pulse 68   Ht 5\' 4"  (1.626 m)   Wt 159 lb (72.1 kg)   LMP 03/21/2020 (Exact Date)   BMI 27.29 kg/m  Skin warm and dry.  Lungs: clear to ausculation bilaterally. Cardiovascular: regular rate and rhythm.     Upstream - 04/03/20 1424      Pregnancy Intention Screening   Does the patient want to become pregnant in the next year? N/A    Does the patient's partner want to become pregnant in the next year? N/A    Would the patient like to discuss contraceptive options today? N/A      Contraception Wrap Up   Current Method Female Sterilization    End Method Female Sterilization    Contraception Counseling Provided No          Assessment:     1. Weight loss counseling, encounter for Discussed eating small frequent meals Walking 30 minutes every day Counting calories and watching portions Review handout on calorie counting Keep food diary and try before starting diet modification to see how may calories she is taking in  2. Body mass index 27.0-27.9, adult Goal is BMI 25   Plan:     Follow up in 4 weeks for weight check if losing will continue this if not may add adipex

## 2020-04-03 NOTE — Patient Instructions (Addendum)

## 2020-04-11 ENCOUNTER — Other Ambulatory Visit: Payer: Self-pay

## 2020-04-11 ENCOUNTER — Telehealth (INDEPENDENT_AMBULATORY_CARE_PROVIDER_SITE_OTHER): Payer: Medicare Other | Admitting: Psychiatry

## 2020-04-11 ENCOUNTER — Encounter (HOSPITAL_COMMUNITY): Payer: Self-pay | Admitting: Psychiatry

## 2020-04-11 DIAGNOSIS — F331 Major depressive disorder, recurrent, moderate: Secondary | ICD-10-CM

## 2020-04-11 MED ORDER — CARIPRAZINE HCL 1.5 MG PO CAPS: 1.5000 mg | ORAL_CAPSULE | Freq: Every day | ORAL | 2 refills | Status: DC

## 2020-04-11 MED ORDER — CLONAZEPAM 0.5 MG PO TABS: 0.5000 mg | ORAL_TABLET | Freq: Two times a day (BID) | ORAL | 2 refills | Status: DC | PRN

## 2020-04-11 MED ORDER — ZOLPIDEM TARTRATE ER 12.5 MG PO TBCR: 12.5000 mg | EXTENDED_RELEASE_TABLET | Freq: Every evening | ORAL | 1 refills | Status: DC | PRN

## 2020-04-11 MED ORDER — FLUOXETINE HCL 40 MG PO CAPS: 40.0000 mg | ORAL_CAPSULE | Freq: Every day | ORAL | 2 refills | Status: DC

## 2020-04-11 NOTE — Progress Notes (Signed)
Virtual Visit via Video Note  I connected with Loretta Padilla on 04/11/20 at  1:20 PM EDT by a video enabled telemedicine application and verified that I am speaking with the correct person using two identifiers.   I discussed the limitations of evaluation and management by telemedicine and the availability of in person appointments. The patient expressed understanding and agreed to proceed    I discussed the assessment and treatment plan with the patient. The patient was provided an opportunity to ask questions and all were answered. The patient agreed with the plan and demonstrated an understanding of the instructions.   The patient was advised to call back or seek an in-person evaluation if the symptoms worsen or if the condition fails to improve as anticipated.  I provided 15 minutes of non-face-to-face time during this encounter. Location: Provider office, patient home  Diannia Ruder, MD  [facility] MD/PA/NP OP Progress Note  04/11/2020 1:38 PM Loretta Padilla  MRN:  [id number]  Chief Complaint:  Chief Complaint    Depression; Anxiety; Follow-up     HPI: This patient is a 34 year old divorced white female who lives with her children and boyfriend in [place].  She is on disability for seizure disorder.  The patient returns after 4 weeks.  Last time she was stating that she had severe mood swings from being happy to plummeting down to being very depressed.  We did change her Abilify to Vraylar which is more targeted at bipolar disorder.  She just got the Vraylar approved about a week ago.  She has been on it for that length of time and is already seeing some improvement in her mood.  She is not as much up-and-down.  Her depression is a little bit better.  She denies any thoughts of suicide.  She is having some trouble sleeping since she started Vraylar.  She takes in the morning and she is very drowsy and then sleeps in the afternoon.  She has trouble with early morning awakening now.  I  think it may work better if she takes it around dinnertime. Visit Diagnosis:    ICD-10-CM   1. Moderate episode of recurrent major depressive disorder (HCC)  F33.1     Past Psychiatric History: Long-term outpatient treatment for depression  Past Medical History:  Past Medical History:  Diagnosis Date  . Anxiety   . Back pain   . Depression   . Kidney stones   . Seizures (HCC)     Past Surgical History:  Procedure Laterality Date  . IMPLANTATION VAGAL NERVE STIMULATOR    . TUBAL LIGATION N/A 10/10/2018   Procedure: POST PARTUM TUBAL LIGATION;  Surgeon: Hermina Staggers, MD;  Location: [facility] BIRTHING SUITES;  Service: Gynecology;  Laterality: N/A;  . VSD REPAIR      Family Psychiatric History: see below  Family History:  Family History  Problem Relation Age of Onset  . Bipolar disorder Mother   . Alcohol abuse Mother   . Depression Sister   . Anxiety disorder Sister   . Alcohol abuse Maternal Grandfather   . Diabetes Maternal Grandfather   . Heart attack Maternal Grandfather   . Alcohol abuse Paternal Grandfather   . Heart disease Paternal Grandfather   . Other Father        unsure of history  . Brain cancer Father        Glioblastoma  . Asthma Son   . Other Son        EOE  . Colon cancer  Maternal Grandmother     Social History:  Social History   Socioeconomic History  . Marital status: Divorced    Spouse name: Not on file  . Number of children: 3  . Years of education: 3 years college  . Highest education level: Not on file  Occupational History  . Occupation: Disabled  Tobacco Use  . Smoking status: Current Every Day Smoker    Packs/day: 1.00  . Smokeless tobacco: Never Used  Vaping Use  . Vaping Use: Never used  Substance and Sexual Activity  . Alcohol use: Not Currently    Comment: occ., 05-07-2017 per pt 2-3 times a mth  . Drug use: No  . Sexual activity: Yes    Birth control/protection: Surgical    Comment: tubal  Other Topics Concern  . Not on  file  Social History Narrative   Lives at home with boyfriend and children.   Right-handed.   Occasional use of caffeine.      Social Determinants of Health   Financial Resource Strain:   . Difficulty of Paying Living Expenses:   Food Insecurity:   . Worried About Programme researcher, broadcasting/film/video in the Last Year:   . Barista in the Last Year:   Transportation Needs:   . Freight forwarder (Medical):   Marland Kitchen Lack of Transportation (Non-Medical):   Physical Activity:   . Days of Exercise per Week:   . Minutes of Exercise per Session:   Stress:   . Feeling of Stress :   Social Connections:   . Frequency of Communication with Friends and Family:   . Frequency of Social Gatherings with Friends and Family:   . Attends Religious Services:   . Active Member of Clubs or Organizations:   . Attends Banker Meetings:   Marland Kitchen Marital Status:     Allergies:  Allergies  Allergen Reactions  . Adhesive [Tape]     Birth control patch and nicoderm patch   . Carbatrol [Carbamazepine] Rash  . Dilantin [Phenytoin Sodium Extended] Rash  . Tizanidine Hcl Rash  . Venlafaxine Rash    Metabolic Disorder Labs: No results found for: HGBA1C, MPG No results found for: PROLACTIN No results found for: CHOL, TRIG, HDL, CHOLHDL, VLDL, LDLCALC Lab Results  Component Value Date   TSH 2.370 03/18/2020   TSH 1.310 01/18/2019    Therapeutic Level Labs: No results found for: LITHIUM No results found for: VALPROATE No components found for:  CBMZ  Current Medications: Current Outpatient Medications  Medication Sig Dispense Refill  . cariprazine (VRAYLAR) capsule Take 1 capsule (1.5 mg total) by mouth daily. 30 capsule 2  . clonazePAM (KLONOPIN) 0.5 MG tablet Take 1 tablet (0.5 mg total) by mouth 2 (two) times daily as needed. for anxiety 60 tablet 2  . FLUoxetine (PROZAC) 40 MG capsule Take 1 capsule (40 mg total) by mouth daily. 30 capsule 2  . fluticasone (FLONASE) 50 MCG/ACT nasal spray  Place 1 spray into both nostrils daily. 16 g 0  . SUMAtriptan (IMITREX) 100 MG tablet Take 1 tablet (100 mg total) by mouth once as needed for up to 1 dose for migraine. May repeat in 2 hours if headache persists or recurs. 30 tablet 0  . topiramate (TOPAMAX) 100 MG tablet TAKE 3 TABLETS BY MOUTH TWICE DAILY 540 tablet 0  . zolpidem (AMBIEN CR) 12.5 MG CR tablet Take 1 tablet (12.5 mg total) by mouth at bedtime as needed for sleep. 30 tablet 1  No current facility-administered medications for this visit.     Musculoskeletal: Strength & Muscle Tone: within normal limits Gait & Station: normal Patient leans: N/A  Psychiatric Specialty Exam: Review of Systems  Psychiatric/Behavioral: Positive for dysphoric mood and sleep disturbance.  All other systems reviewed and are negative.   Last menstrual period 03/21/2020, not currently breastfeeding.There is no height or weight on file to calculate BMI.  General Appearance: Casual and Fairly Groomed  Eye Contact:  Good  Speech:  Clear and Coherent  Volume:  Normal  Mood:  Dysphoric  Affect:  Appropriate and Congruent  Thought Process:  Goal Directed  Orientation:  Full (Time, Place, and Person)  Thought Content: WDL   Suicidal Thoughts:  No  Homicidal Thoughts:  No  Memory:  Immediate;   Good Recent;   Good Remote;   Good  Judgement:  Good  Insight:  Good  Psychomotor Activity:  Decreased  Concentration:  Concentration: Good and Attention Span: Good  Recall:  Good  Fund of Knowledge: Good  Language: Good  Akathisia:  No  Handed:  Right  AIMS (if indicated): not done  Assets:  Communication Skills Desire for Improvement Resilience Social Support Talents/Skills  ADL's:  Intact  Cognition: WNL  Sleep:  Fair   Screenings: PHQ2-9     Initial Prenatal from 03/15/2018 in [facility]  PHQ-2 Total Score 1  PHQ-9 Total Score 9       Assessment and Plan: This patient is a 34 year old female with a history of seizure  disorder, depression mood swings and anxiety.  The Vraylar 1.5 mg daily is starting to help her mood swings but she has not been on it very long.  She is having some trouble with sleep and I wonder if she is on too much activity medication.  She does not think the increase Prozac has helped that much so we will cut this back to 40 mg daily.  She will continue clonazepam 0.5 mg daily for anxiety and Ambien CR 12.5 mg at bedtime for sleep.  She will return to see me in 6 weeks or call sooner as needed   Diannia Ruder, MD 04/11/2020, 1:38 PM

## 2020-04-18 ENCOUNTER — Telehealth (HOSPITAL_COMMUNITY): Payer: Medicare Other | Admitting: Psychiatry

## 2020-05-01 ENCOUNTER — Ambulatory Visit (INDEPENDENT_AMBULATORY_CARE_PROVIDER_SITE_OTHER): Payer: Medicare Other | Admitting: Adult Health

## 2020-05-01 ENCOUNTER — Encounter: Payer: Self-pay | Admitting: Adult Health

## 2020-05-01 VITALS — BP 103/65 | HR 69 | Ht 64.0 in | Wt 161.0 lb

## 2020-05-01 DIAGNOSIS — Z713 Dietary counseling and surveillance: Secondary | ICD-10-CM

## 2020-05-01 DIAGNOSIS — Z6827 Body mass index (BMI) 27.0-27.9, adult: Secondary | ICD-10-CM

## 2020-05-01 MED ORDER — PHENTERMINE HCL 15 MG PO CAPS
15.0000 mg | ORAL_CAPSULE | ORAL | 0 refills | Status: DC
Start: 2020-05-01 — End: 2020-05-28

## 2020-05-01 NOTE — Progress Notes (Signed)
  Subjective:     Patient ID: Loretta Padilla, female   DOB: 07-19-1986, 34 y.o.   MRN: [id number]  HPI Loretta Padilla is a 34 year old white female,divorced, [id number] in for weight check and she has actually gained 2 lbs. PCP is Dr Chanetta Marshall.   Review of Systems Can't lose weight Reviewed past medical,surgical, social and family history. Reviewed medications and allergies.     Objective:   Physical Exam BP 103/65 (BP Location: Left Arm, Patient Position: Sitting, Cuff Size: Normal)   Pulse 69   Ht 5\' 4"  (1.626 m)   Wt 161 lb (73 kg)   LMP 04/17/2020 (Exact Date)   BMI 27.64 kg/m  Skin warm and dry.  Lungs: clear to ausculation bilaterally. Cardiovascular: regular rate and rhythm.  Upstream - 05/01/20 1354      Pregnancy Intention Screening   Does the patient want to become pregnant in the next year? No    Does the patient's partner want to become pregnant in the next year? No    Would the patient like to discuss contraceptive options today? No      Contraception Wrap Up   Current Method Female Sterilization   tubal   End Method Female Sterilization   tubal   Contraception Counseling Provided No             Assessment:      1. Weight loss counseling, encounter for Continue with exercise and food log Will add phentermine Meds ordered this encounter  Medications  . phentermine 15 MG capsule    Sig: Take 1 capsule (15 mg total) by mouth every morning.    Dispense:  30 capsule    Refill:  0    Order Specific Question:   Supervising Provider    Answer:   05/03/20, LUTHER H [[id number]]    2. Body mass index 27.0-27.9, adult     Plan:     Follow up in 4 weeks

## 2020-05-06 ENCOUNTER — Other Ambulatory Visit: Payer: Self-pay

## 2020-05-06 DIAGNOSIS — Z79899 Other long term (current) drug therapy: Secondary | ICD-10-CM | POA: Diagnosis not present

## 2020-05-06 DIAGNOSIS — F17200 Nicotine dependence, unspecified, uncomplicated: Secondary | ICD-10-CM | POA: Diagnosis not present

## 2020-05-06 DIAGNOSIS — G43909 Migraine, unspecified, not intractable, without status migrainosus: Secondary | ICD-10-CM | POA: Insufficient documentation

## 2020-05-06 NOTE — ED Notes (Signed)
Waiting in car [phone]

## 2020-05-06 NOTE — ED Triage Notes (Signed)
Pt complains of migraine for 4 hours. States it was originally a headache but has gotten worse. Hx of same but hasn't had one this bad in a while. Tried taking home meds without relief.

## 2020-05-07 ENCOUNTER — Emergency Department (HOSPITAL_COMMUNITY)
Admission: EM | Admit: 2020-05-07 | Discharge: 2020-05-07 | Disposition: A | Discharge status: Home / Self Care | Payer: Medicare Other | Attending: Emergency Medicine | Admitting: Emergency Medicine

## 2020-05-07 DIAGNOSIS — G43009 Migraine without aura, not intractable, without status migrainosus: Secondary | ICD-10-CM

## 2020-05-07 DIAGNOSIS — G43909 Migraine, unspecified, not intractable, without status migrainosus: Secondary | ICD-10-CM | POA: Diagnosis not present

## 2020-05-07 MED ORDER — METOCLOPRAMIDE HCL 5 MG/ML IJ SOLN
10.0000 mg | Freq: Once | INTRAMUSCULAR | Status: AC
  Administered 2020-05-07: 10 mg via INTRAVENOUS
  Filled 2020-05-07: qty 2

## 2020-05-07 MED ORDER — DEXAMETHASONE SODIUM PHOSPHATE 10 MG/ML IJ SOLN
10.0000 mg | Freq: Once | INTRAMUSCULAR | Status: AC
  Administered 2020-05-07: 10 mg via INTRAVENOUS
  Filled 2020-05-07: qty 1

## 2020-05-07 MED ORDER — DIPHENHYDRAMINE HCL 50 MG/ML IJ SOLN
25.0000 mg | Freq: Once | INTRAMUSCULAR | Status: AC
  Administered 2020-05-07: 25 mg via INTRAVENOUS
  Filled 2020-05-07: qty 1

## 2020-05-07 MED ORDER — SODIUM CHLORIDE 0.9 % IV BOLUS
1000.0000 mL | Freq: Once | INTRAVENOUS | Status: AC
  Administered 2020-05-07: 1000 mL via INTRAVENOUS

## 2020-05-07 NOTE — Discharge Instructions (Addendum)
Go home and rest.  Drink plenty of fluids.  Follow-up with Dr. Debarah Crape if your headaches are getting worse.

## 2020-05-07 NOTE — ED Provider Notes (Signed)
[facility] EMERGENCY DEPARTMENT Provider Note   CSN: [id number] Arrival date & time: 05/06/20  2208   Time seen 3:10 AM  History Chief Complaint  Patient presents with  . Migraine    Kaylin Matson is a 34 y.o. female.  HPI   Patient states she started getting a headache around 6 PM on the evening of August 30.  The headache is frontal and radiates around to her lower back part of her head.  The headache is constant and she describes it as sharp.  She states standing up makes the headache worse, nothing makes it feel better.  She states she tried Tylenol PM, taking a cold shower and taking her sumatriptan without relief.  She has had nausea without vomiting.  She has blurred vision.  She denies any new numbness or tingling of her extremities.  She denies photosensitivity or phono sensitivity.  She states this is like headache she has had before.  She states she sees a neurologist and she has headaches several times a week.  PCP Shon Hale, MD Neurology [address] [place]  Past Medical History:  Diagnosis Date  . Anxiety   . Back pain   . Depression   . Kidney stones   . Seizures [facility])     Patient Active Problem List   Diagnosis Date Noted  . Body mass index 27.0-27.9, adult 04/03/2020  . Weight loss counseling, encounter for 04/03/2020  . Abnormal uterine bleeding (AUB) 03/18/2020  . Lactation symptom 03/18/2020  . History of bilateral tubal ligation 11/15/2018  . Insomnia 03/15/2018  . Epilepsy (HCC) 11/23/2017  . Smoker 08/03/2017  . Depression 05/07/2017    Past Surgical History:  Procedure Laterality Date  . IMPLANTATION VAGAL NERVE STIMULATOR    . TUBAL LIGATION N/A 10/10/2018   Procedure: POST PARTUM TUBAL LIGATION;  Surgeon: Hermina Staggers, MD;  Location: [facility] BIRTHING SUITES;  Service: Gynecology;  Laterality: N/A;  . VSD REPAIR       OB History    Gravida  8   Para  4   Term  4   Preterm      AB  4   Living  4     SAB  2   TAB  2    Ectopic      Multiple  0   Live Births  4           Family History  Problem Relation Age of Onset  . Bipolar disorder Mother   . Alcohol abuse Mother   . Depression Sister   . Anxiety disorder Sister   . Alcohol abuse Maternal Grandfather   . Diabetes Maternal Grandfather   . Heart attack Maternal Grandfather   . Alcohol abuse Paternal Grandfather   . Heart disease Paternal Grandfather   . Other Father        unsure of history  . Brain cancer Father        Glioblastoma  . Asthma Son   . Other Son        EOE  . Colon cancer Maternal Grandmother     Social History   Tobacco Use  . Smoking status: Current Every Day Smoker    Packs/day: 1.00  . Smokeless tobacco: Never Used  Vaping Use  . Vaping Use: Never used  Substance Use Topics  . Alcohol use: Not Currently    Comment: occ., 05-07-2017 per pt 2-3 times a mth  . Drug use: No    Home Medications Prior  to Admission medications   Medication Sig Start Date End Date Taking? Authorizing Provider  ARIPiprazole (ABILIFY) 5 MG tablet Take 5 mg by mouth daily.    [provider]  cariprazine (VRAYLAR) capsule Take 1 capsule (1.5 mg total) by mouth daily. 04/11/20   Myrlene Broker, MD  clonazePAM (KLONOPIN) 0.5 MG tablet Take 1 tablet (0.5 mg total) by mouth 2 (two) times daily as needed. for anxiety 04/11/20   Myrlene Broker, MD  phentermine 15 MG capsule Take 1 capsule (15 mg total) by mouth every morning. 05/01/20   Adline Potter, NP  SUMAtriptan (IMITREX) 100 MG tablet Take 1 tablet (100 mg total) by mouth once as needed for up to 1 dose for migraine. May repeat in 2 hours if headache persists or recurs. 03/18/20   Arabella Merles, CNM  topiramate (TOPAMAX) 100 MG tablet TAKE 3 TABLETS BY MOUTH TWICE DAILY 04/01/20   Levert Feinstein, MD  zolpidem (AMBIEN CR) 12.5 MG CR tablet Take 1 tablet (12.5 mg total) by mouth at bedtime as needed for sleep. 04/11/20 04/11/21  Myrlene Broker, MD    Allergies    Adhesive  [tape], Carbatrol [carbamazepine], Dilantin [phenytoin sodium extended], Tizanidine hcl, and Venlafaxine  Review of Systems   Review of Systems  All other systems reviewed and are negative.   Physical Exam Updated Vital Signs BP (!) 108/58   Pulse 71   Temp 98 F (36.7 C)   Resp 17   Ht 5\' 4"  (1.626 m)   Wt 73 kg   LMP 04/17/2020 (Exact Date)   SpO2 100%   BMI 27.64 kg/m   Physical Exam Vitals and nursing note reviewed.  Constitutional:      Appearance: Normal appearance. She is normal weight.  HENT:     Head: Normocephalic and atraumatic.     Right Ear: External ear normal.     Left Ear: External ear normal.     Nose: Nose normal.  Eyes:     Extraocular Movements: Extraocular movements intact.     Conjunctiva/sclera: Conjunctivae normal.     Pupils: Pupils are equal, round, and reactive to light.  Cardiovascular:     Rate and Rhythm: Normal rate and regular rhythm.     Pulses: Normal pulses.     Heart sounds: Normal heart sounds.  Pulmonary:     Effort: Pulmonary effort is normal. No respiratory distress.     Breath sounds: Normal breath sounds.  Musculoskeletal:        General: Normal range of motion.     Cervical back: Normal range of motion and neck supple.  Skin:    General: Skin is warm and dry.  Neurological:     General: No focal deficit present.     Mental Status: She is alert and oriented to person, place, and time.     Cranial Nerves: No cranial nerve deficit.  Psychiatric:        Mood and Affect: Mood normal.        Behavior: Behavior normal.        Thought Content: Thought content normal.     ED Results / Procedures / Treatments   Labs (all labs ordered are listed, but only abnormal results are displayed) Labs Reviewed - No data to display  EKG None  Radiology No results found.  Procedures Procedures (including critical care time)  Medications Ordered in ED Medications  sodium chloride 0.9 % bolus 1,000 mL (1,000 mLs Intravenous  New Bag/Given  05/07/20 0328)  metoCLOPramide (REGLAN) injection 10 mg (10 mg Intravenous Given 05/07/20 0332)  diphenhydrAMINE (BENADRYL) injection 25 mg (25 mg Intravenous Given 05/07/20 0330)  dexamethasone (DECADRON) injection 10 mg (10 mg Intravenous Given 05/07/20 0328)    ED Course  I have reviewed the triage vital signs and the nursing notes.  Pertinent labs & imaging results that were available during my care of the patient were reviewed by me and considered in my medical decision making (see chart for details).    MDM Rules/Calculators/A&P                            Patient was given migraine cocktail for her typical migraine headache.  Recheck at 4:25 AM patient states her headache is almost gone and she feels ready to be discharged.   Final Clinical Impression(s) / ED Diagnoses Final diagnoses:  Migraine without aura and without status migrainosus, not intractable    Rx / DC Orders ED Discharge Orders    None     Plan discharge  Devoria Albe, MD, Concha Pyo, MD 05/07/20 0430

## 2020-05-09 ENCOUNTER — Telehealth: Payer: Self-pay | Admitting: *Deleted

## 2020-05-09 ENCOUNTER — Ambulatory Visit: Payer: Medicare Other | Admitting: Neurology

## 2020-05-09 NOTE — Telephone Encounter (Signed)
No showed follow up appointment. 

## 2020-05-20 ENCOUNTER — Other Ambulatory Visit: Payer: Self-pay

## 2020-05-20 ENCOUNTER — Ambulatory Visit (INDEPENDENT_AMBULATORY_CARE_PROVIDER_SITE_OTHER): Payer: Medicare Other | Admitting: Neurology

## 2020-05-20 ENCOUNTER — Telehealth: Payer: Self-pay | Admitting: Neurology

## 2020-05-20 ENCOUNTER — Encounter: Payer: Self-pay | Admitting: Neurology

## 2020-05-20 VITALS — BP 128/77 | HR 86 | Ht 64.0 in | Wt 158.0 lb

## 2020-05-20 DIAGNOSIS — R569 Unspecified convulsions: Secondary | ICD-10-CM | POA: Insufficient documentation

## 2020-05-20 DIAGNOSIS — Z79899 Other long term (current) drug therapy: Secondary | ICD-10-CM

## 2020-05-20 DIAGNOSIS — G43709 Chronic migraine without aura, not intractable, without status migrainosus: Secondary | ICD-10-CM | POA: Diagnosis not present

## 2020-05-20 MED ORDER — TOPIRAMATE 100 MG PO TABS: 300.0000 mg | ORAL_TABLET | Freq: Two times a day (BID) | ORAL | 4 refills | Status: AC

## 2020-05-20 MED ORDER — ONDANSETRON 4 MG PO TBDP: 4.0000 mg | ORAL_TABLET | Freq: Three times a day (TID) | ORAL | 6 refills | Status: DC | PRN

## 2020-05-20 NOTE — Telephone Encounter (Signed)
Please change your next appointment time with me instead of Maralyn Sago, in March 2022, if we can, put her on the same time slot

## 2020-05-20 NOTE — Patient Instructions (Signed)
May mix Imitrex, aleve and zofran as needed.

## 2020-05-20 NOTE — Telephone Encounter (Signed)
We did not have the same time slot but same date. I spoke to the patient and she accepted the 11:30 appt time.

## 2020-05-20 NOTE — Progress Notes (Signed)
Chief Complaint  Patient presents with  . Seizures    She has continued taking topiramate 100mg , 3 tabs BID. No reported seizures.     HISTORICAL  HISTORY OF PRESENT ILLNESS: Loretta Padilla is a 34 year old female, accompanied by her boyfriend 30, seen in refer by her primary care doctor  [name], for evaluation of seizure, initial evaluation was October 8th 2018.  I reviewed and summarized the referring note, she had a past medical history of depression, epilepsy, is taking Topamax 100 mg, 2 tablets twice a day, She recently moved from [place] to [place] in June 2018,  She was previously under the care of local neurologist Dr.Richard 10[birth year]10[birth year]1993, MD (Phone: [phone]; Fax:  [phone]; Locations [facility]- [phone] [address]. [address] [phone] [address] [place], [place]).  She reported a history of epilepsy since 34 years old, she used to have generalized tonic-clonic seizure, tried different medications, has been on stable dose of Topamax 100 mg 2 tablets twice a day for many years, but because of frequent recurrent seizures, eventually had VNS placement in December 2014, she reported mild improvement with the VNS, post surgical one year, she has not had generalized seizure, but began to have recurrent smaller spells, staring into the space, unresponsive for few minutes, she began to have increased spells again 2017, now having a spell on a monthly basis, rarely generalized tonic-clonic seizures,  Recent adjustment for her VNS was in June 2018, she noticed wearing off every 5 minutes, with mild left neck pain,   I reviewed the laboratory evaluation in September 2018: Normal CMP, CBC, hemoglobin of 13.2  UPDATE Nov 04 2017:YY She is now taking topamax 100mg  2 tab bid, onfi 20mg  qhs since Oct 2018,   She was having "silenct seizure' when she runs out of her Topamax 400 mg daily, she has dizziness, lightheadedness, staring off into  space, confused, lasting for few second, no GTC events.  Those small seizure-like spells can happen multiple times a day.  Add on onfi 20 mg every night has made a difference, she no longer has recurrent generalized tonic-clonic seizure, before that, she was having it on a monthly basis, She is also taking Prozac for depression, and other simple medication from her psychiatrist office, complains of chronic insomnia,  Today we also performed a VNS interrogation, adjustment, she complains of could not tolerate magnet stimulation  Update June 4th 2019: She has children at age 20, 76, 67, she was taking on topamax while 34 years old has GI issue, 11, health, 5 years ferile seizure.  Last peroid was on May 1, 2nd.  Before that was March 24, 25, 26th.  Home preganncy test was positive  She has not has grandmal seizure, for a long times,     UPDATE Sept 19 2019: She had one seizure on May 24, 2018, feel it coming on, felt dizzy, spinning sensation, loss of consciousness bed, heard her left foot, she is currently [redacted] weeks pregnant, is going to have a boy, due date is on Oct 10 2018.  She continue have smaller spells, staring, unresponsiveness, taking Topamax 100 mg 2 tablets twice a day," more seizure medications did not make any difference"  She could not tolerate strong VNS settings, complains of throat pain  Virtual Visit via Video on Jan 16 2020  She has normal healthy delivery on October 10, 2018, continue have intermittent staring spells, prior to the pregnancy she was taking Topamax 100 mg 2 tablets  twice a day, during pregnancy, dosage was increased to 100 mg 3 tablets twice a day  She is tolerating the medication well, continue have intermittent staring spells, she is not breast-feeding,  UPDATE Sept 13 2021: She is overall doing well, has not had recurrent generalized seizure, couple weeks ago, working hot summer sun, she had transient dizziness, improved by staying in [facility] room,  drink some water,  She is tolerating Topamax 100 mg 3 tablets twice a day, no significant side effect noted,  Her bipolar disorder is under good control with current medications, and Abilify 5 mg daily, Vralar 15 mg daily, clonazepam 0.5 mg twice a day as needed, Ambien as needed  She is recently started on phentermine for weight loss, is considering Chantix for smoking cessation, she currently smokes 1 pack a day, previously tried nicotine patch, asking allergic reaction, tried chewing gum without helping her symptoms,  EEG was normal in May 2021  REVIEW OF SYSTEMS: Full 14 system review of systems performed and notable only for as above All other review of systems were negative.  ALLERGIES: Allergies  Allergen Reactions  . Adhesive [Tape]     Birth control patch and nicoderm patch   . Carbatrol [Carbamazepine] Rash  . Dilantin [Phenytoin Sodium Extended] Rash  . Tizanidine Hcl Rash  . Venlafaxine Rash    HOME MEDICATIONS: Current Outpatient Medications  Medication Sig Dispense Refill  . ARIPiprazole (ABILIFY) 5 MG tablet Take 5 mg by mouth daily.    . cariprazine (VRAYLAR) capsule Take 1 capsule (1.5 mg total) by mouth daily. 30 capsule 2  . clonazePAM (KLONOPIN) 0.5 MG tablet Take 1 tablet (0.5 mg total) by mouth 2 (two) times daily as needed. for anxiety 60 tablet 2  . phentermine 15 MG capsule Take 1 capsule (15 mg total) by mouth every morning. 30 capsule 0  . SUMAtriptan (IMITREX) 100 MG tablet Take 1 tablet (100 mg total) by mouth once as needed for up to 1 dose for migraine. May repeat in 2 hours if headache persists or recurs. 30 tablet 0  . topiramate (TOPAMAX) 100 MG tablet Take 3 tablets (300 mg total) by mouth 2 (two) times daily. 540 tablet 4  . zolpidem (AMBIEN CR) 12.5 MG CR tablet Take 1 tablet (12.5 mg total) by mouth at bedtime as needed for sleep. 30 tablet 1  . ondansetron (ZOFRAN ODT) 4 MG disintegrating tablet Take 1 tablet (4 mg total) by mouth every 8  (eight) hours as needed. 20 tablet 6   No current facility-administered medications for this visit.    PAST MEDICAL HISTORY: Past Medical History:  Diagnosis Date  . Anxiety   . Back pain   . Depression   . Kidney stones   . Seizures (HCC)     PAST SURGICAL HISTORY: Past Surgical History:  Procedure Laterality Date  . IMPLANTATION VAGAL NERVE STIMULATOR    . TUBAL LIGATION N/A 10/10/2018   Procedure: POST PARTUM TUBAL LIGATION;  Surgeon: Hermina Staggers, MD;  Location: [facility];  Service: Gynecology;  Laterality: N/A;  . VSD REPAIR      FAMILY HISTORY: Family History  Problem Relation Age of Onset  . Bipolar disorder Mother   . Alcohol abuse Mother   . Depression Sister   . Anxiety disorder Sister   . Alcohol abuse Maternal Grandfather   . Diabetes Maternal Grandfather   . Heart attack Maternal Grandfather   . Alcohol abuse Paternal Grandfather   . Heart disease Paternal Grandfather   .  Other Father        unsure of history  . Brain cancer Father        Glioblastoma  . Asthma Son   . Other Son        EOE  . Colon cancer Maternal Grandmother     SOCIAL HISTORY: Social History   Socioeconomic History  . Marital status: Divorced    Spouse name: Not on file  . Number of children: 3  . Years of education: 3 years college  . Highest education level: Not on file  Occupational History  . Occupation: Disabled  Tobacco Use  . Smoking status: Current Every Day Smoker    Packs/day: 1.00  . Smokeless tobacco: Never Used  Vaping Use  . Vaping Use: Never used  Substance and Sexual Activity  . Alcohol use: Not Currently    Comment: occ., 05-07-2017 per pt 2-3 times a mth  . Drug use: No  . Sexual activity: Yes    Birth control/protection: Surgical    Comment: tubal  Other Topics Concern  . Not on file  Social History Narrative   Lives at home with boyfriend and children.   Right-handed.   Occasional use of caffeine.      Social Determinants of  Health   Financial Resource Strain:   . Difficulty of Paying Living Expenses: Not on file  Food Insecurity:   . Worried About Programme researcher, broadcasting/film/video in the Last Year: Not on file  . Ran Out of Food in the Last Year: Not on file  Transportation Needs:   . Lack of Transportation (Medical): Not on file  . Lack of Transportation (Non-Medical): Not on file  Physical Activity:   . Days of Exercise per Week: Not on file  . Minutes of Exercise per Session: Not on file  Stress:   . Feeling of Stress : Not on file  Social Connections:   . Frequency of Communication with Friends and Family: Not on file  . Frequency of Social Gatherings with Friends and Family: Not on file  . Attends Religious Services: Not on file  . Active Member of Clubs or Organizations: Not on file  . Attends Banker Meetings: Not on file  . Marital Status: Not on file  Intimate Partner Violence:   . Fear of Current or Ex-Partner: Not on file  . Emotionally Abused: Not on file  . Physically Abused: Not on file  . Sexually Abused: Not on file     PHYSICAL EXAM   Vitals:   05/20/20 0722  BP: 128/77  Pulse: 86  Weight: 158 lb (71.7 kg)  Height: 5\' 4"  (1.626 m)   Not recorded     Body mass index is 27.12 kg/m.  PHYSICAL EXAMNIATION:  Gen: NAD, conversant, well nourised, well groomed                     Cardiovascular: Regular rate rhythm, no peripheral edema, warm, nontender. Eyes: Conjunctivae clear without exudates or hemorrhage Neck: Supple, no carotid bruits. Pulmonary: Clear to auscultation bilaterally   NEUROLOGICAL EXAM:  MENTAL STATUS: Speech/cognition Awake alert oriented to history taking and casual conversation   CRANIAL NERVES: CN II: Visual fields are full to confrontation. Pupils are round equal and briskly reactive to light. CN III, IV, VI: extraocular movement are normal. No ptosis. CN V: Facial sensation is intact to light touch CN VII: Face is symmetric with normal eye  closure  CN VIII: Hearing is normal  to causal conversation. CN IX, X: Phonation is normal. CN XI: Head turning and shoulder shrug are intact  MOTOR: There is no pronator drift of out-stretched arms. Muscle bulk and tone are normal. Muscle strength is normal.  REFLEXES: Reflexes are 2+ and symmetric at the biceps, triceps, knees, and ankles. Plantar responses are flexor.  SENSORY: Intact to light touch, pinprick and vibratory sensation are intact in fingers and toes.  COORDINATION: There is no trunk or limb dysmetria noted.  GAIT/STANCE: Posture is normal. Gait is steady with normal steps, base, arm swing, and turning. Heel and toe walking are normal. Tandem gait is normal.  Romberg is absent.   DIAGNOSTIC DATA (LABS, IMAGING, TESTING) - I reviewed patient records, labs, notes, testing and imaging myself where available.   ASSESSMENT AND PLAN  Tanaysha Laban EmperorCarrillo is a 34 y.o. female   Epilepsy  Under good control with current medications, keep Topamax 100 mg 3 tablets twice a day, refilled her prescription,  EEG was normal in May 2021  Laboratory evaluations including Topamax level  Status post VNS placement, previously could not tolerate adjustment, especially magnetic settings, will recheck and adjust settings at next visit  She was transferred from [place], no image information on file, may consider MRI brain w/wo    Chronic migraine headaches  Imitrex plus Zofran, plus Aleve as needed for prolonged headaches,  Tobacco abuse  She raised the question about using Chantix as smoking cessation, I do not see absolute contraindication.     Levert FeinsteinYijun Betzaida Cremeens, M.D. Ph.D.  [facility] [address], [address] [place], [place] [address] Ph: [phone] Fax: [phone]  CC:  Shon Haleimberlake, Kathryn S, MD [address] [place],  [place] [id number]  Shon Haleimberlake, Kathryn S, MD

## 2020-05-21 LAB — COMPREHENSIVE METABOLIC PANEL
ALT: 6 IU/L (ref 0–32)
AST: 7 IU/L (ref 0–40)
Albumin/Globulin Ratio: 2 (ref 1.2–2.2)
Albumin: 4.4 g/dL (ref 3.8–4.8)
Alkaline Phosphatase: 66 IU/L (ref 44–121)
BUN/Creatinine Ratio: 10 (ref 9–23)
BUN: 10 mg/dL (ref 6–20)
Bilirubin Total: 0.3 mg/dL (ref 0.0–1.2)
CO2: 19 mmol/L — ABNORMAL LOW (ref 20–29)
Calcium: 9 mg/dL (ref 8.7–10.2)
Chloride: 109 mmol/L — ABNORMAL HIGH (ref 96–106)
Creatinine, Ser: 0.99 mg/dL (ref 0.57–1.00)
GFR calc Af Amer: 85 mL/min/{1.73_m2} (ref >59)
GFR calc non Af Amer: 74 mL/min/{1.73_m2} (ref >59)
Globulin, Total: 2.2 g/dL (ref 1.5–4.5)
Glucose: 77 mg/dL (ref 65–99)
Potassium: 4 mmol/L (ref 3.5–5.2)
Sodium: 140 mmol/L (ref 134–144)
Total Protein: 6.6 g/dL (ref 6.0–8.5)

## 2020-05-21 LAB — CBC WITH DIFFERENTIAL
Basophils Absolute: 0 10*3/uL (ref 0.0–0.2)
Basos: 0 %
EOS (ABSOLUTE): 0.4 10*3/uL (ref 0.0–0.4)
Eos: 8 %
Hematocrit: 39.1 % (ref 34.0–46.6)
Hemoglobin: 12.8 g/dL (ref 11.1–15.9)
Immature Grans (Abs): 0 10*3/uL (ref 0.0–0.1)
Immature Granulocytes: 0 %
Lymphocytes Absolute: 1.3 10*3/uL (ref 0.7–3.1)
Lymphs: 29 %
MCH: 30.4 pg (ref 26.6–33.0)
MCHC: 32.7 g/dL (ref 31.5–35.7)
MCV: 93 fL (ref 79–97)
Monocytes Absolute: 0.4 10*3/uL (ref 0.1–0.9)
Monocytes: 8 %
Neutrophils Absolute: 2.5 10*3/uL (ref 1.4–7.0)
Neutrophils: 55 %
RBC: 4.21 x10E6/uL (ref 3.77–5.28)
RDW: 12.8 % (ref 11.7–15.4)
WBC: 4.6 10*3/uL (ref 3.4–10.8)

## 2020-05-21 LAB — TSH: TSH: 2.94 u[IU]/mL (ref 0.450–4.500)

## 2020-05-21 LAB — TOPIRAMATE LEVEL: Topiramate Lvl: 8.2 ug/mL (ref 2.0–25.0)

## 2020-05-28 ENCOUNTER — Ambulatory Visit (INDEPENDENT_AMBULATORY_CARE_PROVIDER_SITE_OTHER): Payer: Medicare Other | Admitting: Adult Health

## 2020-05-28 ENCOUNTER — Encounter: Payer: Self-pay | Admitting: Adult Health

## 2020-05-28 VITALS — BP 117/80 | HR 79 | Ht 64.0 in | Wt 158.0 lb

## 2020-05-28 DIAGNOSIS — Z6827 Body mass index (BMI) 27.0-27.9, adult: Secondary | ICD-10-CM

## 2020-05-28 DIAGNOSIS — Z713 Dietary counseling and surveillance: Secondary | ICD-10-CM | POA: Diagnosis not present

## 2020-05-28 MED ORDER — PHENTERMINE HCL 15 MG PO CAPS: 15.0000 mg | ORAL_CAPSULE | ORAL | 0 refills | Status: DC

## 2020-05-28 NOTE — Progress Notes (Signed)
  Subjective:     Patient ID: Loretta Padilla, female   DOB: [birth year], 34 y.o.   MRN: [id number]  HPI Loretta Padilla is a 34 year old whtie female,divorced, [id number] in for weight and BP check. PCP is Dr Chanetta Marshall.  Review of Systems Lost 3 lbs Denies any headaches or trouble sleeping Reviewed past medical,surgical, social and family history. Reviewed medications and allergies.     Objective:   Physical Exam BP 117/80 (BP Location: Left Arm, Patient Position: Sitting, Cuff Size: Normal)   Pulse 79   Ht 5\' 4"  (1.626 m)   Wt 158 lb (71.7 kg)   LMP 05/18/2020   Breastfeeding No   BMI 27.12 kg/m  Skin warm and dry.  Lungs: clear to ausculation bilaterally. Cardiovascular: regular rate and rhythm. She lost 3 lbs   Upstream - 05/28/20 1133      Pregnancy Intention Screening   Does the patient want to become pregnant in the next year? No    Does the patient's partner want to become pregnant in the next year? No    Would the patient like to discuss contraceptive options today? No      Contraception Wrap Up   Current Method Female Sterilization    End Method Female Sterilization    Contraception Counseling Provided No             Assessment:     1. Weight loss counseling, encounter for Will refill phentermine Meds ordered this encounter  Medications  . phentermine 15 MG capsule    Sig: Take 1 capsule (15 mg total) by mouth every morning.    Dispense:  30 capsule    Refill:  0    Order Specific Question:   Supervising Provider    Answer:   05/30/20, LUTHER H [[id number]]    2. Body mass index 27.0-27.9, adult Increase exercise     Plan:     Follow up in 4 weeks

## 2020-05-30 ENCOUNTER — Encounter (HOSPITAL_COMMUNITY): Payer: Self-pay | Admitting: Psychiatry

## 2020-05-30 ENCOUNTER — Other Ambulatory Visit: Payer: Self-pay

## 2020-05-30 ENCOUNTER — Telehealth (INDEPENDENT_AMBULATORY_CARE_PROVIDER_SITE_OTHER): Payer: Medicare Other | Admitting: Psychiatry

## 2020-05-30 DIAGNOSIS — F331 Major depressive disorder, recurrent, moderate: Secondary | ICD-10-CM

## 2020-05-30 MED ORDER — ZOLPIDEM TARTRATE ER 12.5 MG PO TBCR: 12.5000 mg | EXTENDED_RELEASE_TABLET | Freq: Every evening | ORAL | 1 refills | Status: DC | PRN

## 2020-05-30 MED ORDER — CLONAZEPAM 0.5 MG PO TABS
0.5000 mg | ORAL_TABLET | Freq: Two times a day (BID) | ORAL | 2 refills | Status: DC | PRN
Start: 2020-05-30 — End: 2020-07-01

## 2020-05-30 MED ORDER — FLUOXETINE HCL 40 MG PO CAPS
40.0000 mg | ORAL_CAPSULE | Freq: Every day | ORAL | 2 refills | Status: DC
Start: 2020-05-30 — End: 2020-07-01

## 2020-05-30 MED ORDER — CARIPRAZINE HCL 1.5 MG PO CAPS
1.5000 mg | ORAL_CAPSULE | Freq: Every day | ORAL | 2 refills | Status: DC
Start: 2020-05-30 — End: 2020-07-01

## 2020-05-30 NOTE — Progress Notes (Signed)
Virtual Visit via Video Note  I connected with Loretta Padilla on 05/30/20 at 10:20 AM EDT by a video enabled telemedicine application and verified that I am speaking with the correct person using two identifiers.   I discussed the limitations of evaluation and management by telemedicine and the availability of in person appointments. The patient expressed understanding and agreed to proceed.    I discussed the assessment and treatment plan with the patient. The patient was provided an opportunity to ask questions and all were answered. The patient agreed with the plan and demonstrated an understanding of the instructions.   The patient was advised to call back or seek an in-person evaluation if the symptoms worsen or if the condition fails to improve as anticipated.  I provided 15 minutes of non-face-to-face time during this encounter. Location: Provider office, patient home  Diannia Ruder, MD  [facility] MD/PA/NP OP Progress Note  05/30/2020 10:40 AM Loretta Padilla  MRN:  [id number]  Chief Complaint:  Chief Complaint    Depression; Anxiety; Follow-up     HPI: This patient is a 34 year old divorced white female who lives with her children and boyfriend in [place].  She is on disability for seizure disorder.  The patient returns for follow-up after 2 months.  For the most part she has been doing well.  For some reason she did not understand that she was supposed to stop the Abilify when she started Vraylar.  She states that since being on Vraylar her moods have been a lot more even and she denies being depressed.  She is sleeping well with the Ambien and the clonazepam continues to help her anxiety.  I explained that I would rather she not be on 2 antipsychotics and as she is trying to lose weight going off Abilify may make more sense. Visit Diagnosis:    ICD-10-CM   1. Moderate episode of recurrent major depressive disorder (HCC)  F33.1     Past Psychiatric History: Long-term  outpatient treatment for depression Past Medical History:  Past Medical History:  Diagnosis Date  . Anxiety   . Back pain   . Depression   . Kidney stones   . Seizures (HCC)     Past Surgical History:  Procedure Laterality Date  . IMPLANTATION VAGAL NERVE STIMULATOR    . TUBAL LIGATION N/A 10/10/2018   Procedure: POST PARTUM TUBAL LIGATION;  Surgeon: Hermina Staggers, MD;  Location: [facility] BIRTHING SUITES;  Service: Gynecology;  Laterality: N/A;  . VSD REPAIR      Family Psychiatric History: see below  Family History:  Family History  Problem Relation Age of Onset  . Bipolar disorder Mother   . Alcohol abuse Mother   . Depression Sister   . Anxiety disorder Sister   . Alcohol abuse Maternal Grandfather   . Diabetes Maternal Grandfather   . Heart attack Maternal Grandfather   . Alcohol abuse Paternal Grandfather   . Heart disease Paternal Grandfather   . Other Father        unsure of history  . Brain cancer Father        Glioblastoma  . Asthma Son   . Other Son        EOE  . Colon cancer Maternal Grandmother     Social History:  Social History   Socioeconomic History  . Marital status: Divorced    Spouse name: Not on file  . Number of children: 3  . Years of education: 3 years college  . Highest  education level: Not on file  Occupational History  . Occupation: Disabled  Tobacco Use  . Smoking status: Current Every Day Smoker    Packs/day: 1.00  . Smokeless tobacco: Never Used  Vaping Use  . Vaping Use: Never used  Substance and Sexual Activity  . Alcohol use: Not Currently    Comment: occ., 05-07-2017 per pt 2-3 times a mth  . Drug use: No  . Sexual activity: Yes    Birth control/protection: Surgical    Comment: tubal  Other Topics Concern  . Not on file  Social History Narrative   Lives at home with boyfriend and children.   Right-handed.   Occasional use of caffeine.      Social Determinants of Health   Financial Resource Strain:   . Difficulty  of Paying Living Expenses: Not on file  Food Insecurity:   . Worried About Programme researcher, broadcasting/film/video in the Last Year: Not on file  . Ran Out of Food in the Last Year: Not on file  Transportation Needs:   . Lack of Transportation (Medical): Not on file  . Lack of Transportation (Non-Medical): Not on file  Physical Activity:   . Days of Exercise per Week: Not on file  . Minutes of Exercise per Session: Not on file  Stress:   . Feeling of Stress : Not on file  Social Connections:   . Frequency of Communication with Friends and Family: Not on file  . Frequency of Social Gatherings with Friends and Family: Not on file  . Attends Religious Services: Not on file  . Active Member of Clubs or Organizations: Not on file  . Attends Banker Meetings: Not on file  . Marital Status: Not on file    Allergies:  Allergies  Allergen Reactions  . Adhesive [Tape]     Birth control patch and nicoderm patch   . Carbatrol [Carbamazepine] Rash  . Dilantin [Phenytoin Sodium Extended] Rash  . Tizanidine Hcl Rash  . Venlafaxine Rash    Metabolic Disorder Labs: No results found for: HGBA1C, MPG No results found for: PROLACTIN No results found for: CHOL, TRIG, HDL, CHOLHDL, VLDL, LDLCALC Lab Results  Component Value Date   TSH 2.940 05/20/2020   TSH 2.370 03/18/2020    Therapeutic Level Labs: No results found for: LITHIUM No results found for: VALPROATE No components found for:  CBMZ  Current Medications: Current Outpatient Medications  Medication Sig Dispense Refill  . cariprazine (VRAYLAR) capsule Take 1 capsule (1.5 mg total) by mouth daily. 30 capsule 2  . clonazePAM (KLONOPIN) 0.5 MG tablet Take 1 tablet (0.5 mg total) by mouth 2 (two) times daily as needed. for anxiety 60 tablet 2  . FLUoxetine (PROZAC) 40 MG capsule Take 1 capsule (40 mg total) by mouth daily. 30 capsule 2  . ondansetron (ZOFRAN ODT) 4 MG disintegrating tablet Take 1 tablet (4 mg total) by mouth every 8 (eight)  hours as needed. 20 tablet 6  . phentermine 15 MG capsule Take 1 capsule (15 mg total) by mouth every morning. 30 capsule 0  . SUMAtriptan (IMITREX) 100 MG tablet Take 1 tablet (100 mg total) by mouth once as needed for up to 1 dose for migraine. May repeat in 2 hours if headache persists or recurs. 30 tablet 0  . topiramate (TOPAMAX) 100 MG tablet Take 3 tablets (300 mg total) by mouth 2 (two) times daily. 540 tablet 4  . zolpidem (AMBIEN CR) 12.5 MG CR tablet Take 1 tablet (  12.5 mg total) by mouth at bedtime as needed for sleep. 30 tablet 1   No current facility-administered medications for this visit.     Musculoskeletal: Strength & Muscle Tone: within normal limits Gait & Station: normal Patient leans: N/A  Psychiatric Specialty Exam: Review of Systems  Neurological: Positive for seizures and headaches.  All other systems reviewed and are negative.   Last menstrual period 05/18/2020, not currently breastfeeding.There is no height or weight on file to calculate BMI.  General Appearance: Casual and Fairly Groomed  Eye Contact:  Good  Speech:  Clear and Coherent  Volume:  Normal  Mood:  Euthymic  Affect:  Appropriate and Congruent  Thought Process:  Goal Directed  Orientation:  Full (Time, Place, and Person)  Thought Content: WDL   Suicidal Thoughts:  No  Homicidal Thoughts:  No  Memory:  Immediate;   Good Recent;   Good Remote;   Good  Judgement:  Good  Insight:  Fair  Psychomotor Activity:  Normal  Concentration:  Concentration: Good and Attention Span: Good  Recall:  Good  Fund of Knowledge: Good  Language: Good  Akathisia:  No  Handed:  Right  AIMS (if indicated): not done  Assets:  Communication Skills Desire for Improvement Resilience Social Support Talents/Skills  ADL's:  Intact  Cognition: WNL  Sleep:  Good   Screenings: PHQ2-9     Initial Prenatal from 03/15/2018 in [facility]  PHQ-2 Total Score 1  PHQ-9 Total Score 9       Assessment and  Plan: This patient is a 34 year old female with a history of seizure disorder depression mood swings and anxiety.  She seems to be stable.  I would rather however that she not take both Abilify and Vraylar so the Abilify will be discontinued.  She will continue Vraylar 1.5 mg daily for mood swings, Prozac 40 mg daily for depression, clonazepam 0.5 mg daily as needed for anxiety and Ambien CR 12.5 mg at bedtime for sleep.  She will return to see me in 3 months   Diannia Ruder, MD 05/30/2020, 10:40 AM

## 2020-06-28 ENCOUNTER — Ambulatory Visit: Payer: Medicare Other | Admitting: Adult Health

## 2020-07-01 ENCOUNTER — Other Ambulatory Visit: Payer: Self-pay

## 2020-07-01 ENCOUNTER — Encounter (HOSPITAL_COMMUNITY): Payer: Self-pay | Admitting: Psychiatry

## 2020-07-01 ENCOUNTER — Telehealth (INDEPENDENT_AMBULATORY_CARE_PROVIDER_SITE_OTHER): Payer: Medicare Other | Admitting: Psychiatry

## 2020-07-01 DIAGNOSIS — F331 Major depressive disorder, recurrent, moderate: Secondary | ICD-10-CM | POA: Diagnosis not present

## 2020-07-01 MED ORDER — CARIPRAZINE HCL 1.5 MG PO CAPS
1.5000 mg | ORAL_CAPSULE | Freq: Every day | ORAL | 2 refills | Status: DC
Start: 2020-07-01 — End: 2020-08-06

## 2020-07-01 MED ORDER — ZOLPIDEM TARTRATE ER 12.5 MG PO TBCR: 12.5000 mg | EXTENDED_RELEASE_TABLET | Freq: Every evening | ORAL | 2 refills | Status: DC | PRN

## 2020-07-01 MED ORDER — FLUOXETINE HCL 40 MG PO CAPS
40.0000 mg | ORAL_CAPSULE | Freq: Every day | ORAL | 2 refills | Status: DC
Start: 2020-07-01 — End: 2020-08-06

## 2020-07-01 MED ORDER — CLONAZEPAM 0.5 MG PO TABS
0.5000 mg | ORAL_TABLET | Freq: Two times a day (BID) | ORAL | 2 refills | Status: DC | PRN
Start: 2020-07-01 — End: 2020-08-06

## 2020-07-01 NOTE — Progress Notes (Signed)
Virtual Visit via Video Note  I connected with Loretta Padilla on 07/01/20 at 10:40 AM EDT by a video enabled telemedicine application and verified that I am speaking with the correct person using two identifiers.  Location: Patient: home Provider: home   I discussed the limitations of evaluation and management by telemedicine and the availability of in person appointments. The patient expressed understanding and agreed to proceed.    I discussed the assessment and treatment plan with the patient. The patient was provided an opportunity to ask questions and all were answered. The patient agreed with the plan and demonstrated an understanding of the instructions.   The patient was advised to call back or seek an in-person evaluation if the symptoms worsen or if the condition fails to improve as anticipated.  I provided 15 minutes of non-face-to-face time during this encounter.   Loretta Ruder, MD  [facility] MD/PA/NP OP Progress Note  07/01/2020 11:00 AM Loretta Padilla  MRN:  [id number]  Chief Complaint:  Chief Complaint    Anxiety; Depression; Manic Behavior     HPI: Patient is a 34 year old divorced white female who lives with her children and boyfriend in [place].  She is on disability for seizure disorder  The patient returns after 4 weeks.  Last time we discovered that she had continue to take Abilify even though we had started Vraylar.  I did not like the idea of her being on 2 antipsychotics.  She is now only taking the Vraylar and she states that she feels just fine.  She recently went on a trip by herself to visit her son in [place] and also visited friends in [place].  She states that she had a great time.  Most the time she is sleeping fairly well and the clonazepam continues to help with her anxiety.  She states that her mood is much improved and she is no longer depressed.  She seemed very upbeat today. Visit Diagnosis:    ICD-10-CM   1. Moderate episode of recurrent  major depressive disorder (HCC)  F33.1     Past Psychiatric History: Longtime outpatient treatment for depression  Past Medical History:  Past Medical History:  Diagnosis Date  . Anxiety   . Back pain   . Depression   . Kidney stones   . Seizures (HCC)     Past Surgical History:  Procedure Laterality Date  . IMPLANTATION VAGAL NERVE STIMULATOR    . TUBAL LIGATION N/A 10/10/2018   Procedure: POST PARTUM TUBAL LIGATION;  Surgeon: Hermina Staggers, MD;  Location: [facility] BIRTHING SUITES;  Service: Gynecology;  Laterality: N/A;  . VSD REPAIR      Family Psychiatric History: see below  Family History:  Family History  Problem Relation Age of Onset  . Bipolar disorder Mother   . Alcohol abuse Mother   . Depression Sister   . Anxiety disorder Sister   . Alcohol abuse Maternal Grandfather   . Diabetes Maternal Grandfather   . Heart attack Maternal Grandfather   . Alcohol abuse Paternal Grandfather   . Heart disease Paternal Grandfather   . Other Father        unsure of history  . Brain cancer Father        Glioblastoma  . Asthma Son   . Other Son        EOE  . Colon cancer Maternal Grandmother     Social History:  Social History   Socioeconomic History  . Marital status: Divorced  Spouse name: Not on file  . Number of children: 3  . Years of education: 3 years college  . Highest education level: Not on file  Occupational History  . Occupation: Disabled  Tobacco Use  . Smoking status: Current Every Day Smoker    Packs/day: 1.00  . Smokeless tobacco: Never Used  Vaping Use  . Vaping Use: Never used  Substance and Sexual Activity  . Alcohol use: Not Currently    Comment: occ., 05-07-2017 per pt 2-3 times a mth  . Drug use: No  . Sexual activity: Yes    Birth control/protection: Surgical    Comment: tubal  Other Topics Concern  . Not on file  Social History Narrative   Lives at home with boyfriend and children.   Right-handed.   Occasional use of caffeine.       Social Determinants of Health   Financial Resource Strain:   . Difficulty of Paying Living Expenses: Not on file  Food Insecurity:   . Worried About Programme researcher, broadcasting/film/video in the Last Year: Not on file  . Ran Out of Food in the Last Year: Not on file  Transportation Needs:   . Lack of Transportation (Medical): Not on file  . Lack of Transportation (Non-Medical): Not on file  Physical Activity:   . Days of Exercise per Week: Not on file  . Minutes of Exercise per Session: Not on file  Stress:   . Feeling of Stress : Not on file  Social Connections:   . Frequency of Communication with Friends and Family: Not on file  . Frequency of Social Gatherings with Friends and Family: Not on file  . Attends Religious Services: Not on file  . Active Member of Clubs or Organizations: Not on file  . Attends Banker Meetings: Not on file  . Marital Status: Not on file    Allergies:  Allergies  Allergen Reactions  . Adhesive [Tape]     Birth control patch and nicoderm patch   . Carbatrol [Carbamazepine] Rash  . Dilantin [Phenytoin Sodium Extended] Rash  . Tizanidine Hcl Rash  . Venlafaxine Rash    Metabolic Disorder Labs: No results found for: HGBA1C, MPG No results found for: PROLACTIN No results found for: CHOL, TRIG, HDL, CHOLHDL, VLDL, LDLCALC Lab Results  Component Value Date   TSH 2.940 05/20/2020   TSH 2.370 03/18/2020    Therapeutic Level Labs: No results found for: LITHIUM No results found for: VALPROATE No components found for:  CBMZ  Current Medications: Current Outpatient Medications  Medication Sig Dispense Refill  . cariprazine (VRAYLAR) capsule Take 1 capsule (1.5 mg total) by mouth daily. 30 capsule 2  . clonazePAM (KLONOPIN) 0.5 MG tablet Take 1 tablet (0.5 mg total) by mouth 2 (two) times daily as needed. for anxiety 60 tablet 2  . FLUoxetine (PROZAC) 40 MG capsule Take 1 capsule (40 mg total) by mouth daily. 30 capsule 2  . ondansetron (ZOFRAN  ODT) 4 MG disintegrating tablet Take 1 tablet (4 mg total) by mouth every 8 (eight) hours as needed. 20 tablet 6  . phentermine 15 MG capsule Take 1 capsule (15 mg total) by mouth every morning. 30 capsule 0  . SUMAtriptan (IMITREX) 100 MG tablet Take 1 tablet (100 mg total) by mouth once as needed for up to 1 dose for migraine. May repeat in 2 hours if headache persists or recurs. 30 tablet 0  . topiramate (TOPAMAX) 100 MG tablet Take 3 tablets (300 mg  total) by mouth 2 (two) times daily. 540 tablet 4  . zolpidem (AMBIEN CR) 12.5 MG CR tablet Take 1 tablet (12.5 mg total) by mouth at bedtime as needed for sleep. 30 tablet 2   No current facility-administered medications for this visit.     Musculoskeletal: Strength & Muscle Tone: within normal limits Gait & Station: normal Patient leans: N/A  Psychiatric Specialty Exam: Review of Systems  Neurological: Positive for seizures.  All other systems reviewed and are negative.   not currently breastfeeding.There is no height or weight on file to calculate BMI.  General Appearance: Casual and Fairly Groomed  Eye Contact:  Good  Speech:  Clear and Coherent  Volume:  Normal  Mood:  Euthymic  Affect:  Appropriate and Congruent  Thought Process:  Goal Directed  Orientation:  Full (Time, Place, and Person)  Thought Content: WDL   Suicidal Thoughts:  No  Homicidal Thoughts:  No  Memory:  Immediate;   Good Recent;   Good Remote;   Good  Judgement:  Good  Insight:  Good  Psychomotor Activity:  Normal  Concentration:  Concentration: Good and Attention Span: Good  Recall:  Good  Fund of Knowledge: Good  Language: Good  Akathisia:  No  Handed:  Right  AIMS (if indicated): not done  Assets:  Communication Skills Desire for Improvement Resilience Social Support Talents/Skills  ADL's:  Intact  Cognition: WNL  Sleep:  Good   Screenings: PHQ2-9     Initial Prenatal from 03/15/2018 in [facility]  PHQ-2 Total Score 1  PHQ-9  Total Score 9       Assessment and Plan: Patient is a 34 year old female with a history of seizure disorder depression mood swings and anxiety.  She is doing well with just the Vraylar for mood stabilization.  She will continue Vraylar 1.5 mg daily for mood swing, Prozac 40 mg daily for depression, clonazepam 0.5 mg daily as needed for anxiety and Ambien CR 12.5 mg at bedtime for sleep.  She will return to see me in 3 months   Loretta Ruder, MD 07/01/2020, 11:00 AM

## 2020-07-16 ENCOUNTER — Other Ambulatory Visit (HOSPITAL_COMMUNITY): Payer: Self-pay | Admitting: Psychiatry

## 2020-07-16 ENCOUNTER — Telehealth (HOSPITAL_COMMUNITY): Payer: Self-pay | Admitting: *Deleted

## 2020-07-16 MED ORDER — ESCITALOPRAM OXALATE 20 MG PO TABS: 20.0000 mg | ORAL_TABLET | Freq: Two times a day (BID) | ORAL | 2 refills | Status: DC

## 2020-07-16 NOTE — Telephone Encounter (Signed)
Spoke to pt, changed prozac to Lexapro. Please call her to see me in 3 weeks

## 2020-07-16 NOTE — Telephone Encounter (Signed)
Noted, appt scheduled 

## 2020-07-16 NOTE — Telephone Encounter (Signed)
Will forward this to Dr. Tenny Craw.

## 2020-07-16 NOTE — Telephone Encounter (Signed)
Patient called stating that she would like for provider to call her. Per pt she would like for provider to change her Prozac to Lexapro. Per pt she would like for provider to call her. [phone].

## 2020-08-06 ENCOUNTER — Other Ambulatory Visit: Payer: Self-pay

## 2020-08-06 ENCOUNTER — Encounter (HOSPITAL_COMMUNITY): Payer: Self-pay | Admitting: Psychiatry

## 2020-08-06 ENCOUNTER — Telehealth (INDEPENDENT_AMBULATORY_CARE_PROVIDER_SITE_OTHER): Payer: Medicare Other | Admitting: Psychiatry

## 2020-08-06 DIAGNOSIS — F331 Major depressive disorder, recurrent, moderate: Secondary | ICD-10-CM

## 2020-08-06 MED ORDER — ZOLPIDEM TARTRATE ER 12.5 MG PO TBCR: 12.5000 mg | EXTENDED_RELEASE_TABLET | Freq: Every evening | ORAL | 2 refills | Status: DC | PRN

## 2020-08-06 MED ORDER — CLONAZEPAM 0.5 MG PO TABS
0.5000 mg | ORAL_TABLET | Freq: Two times a day (BID) | ORAL | 2 refills | Status: DC | PRN
Start: 2020-08-06 — End: 2020-11-06

## 2020-08-06 MED ORDER — CARIPRAZINE HCL 1.5 MG PO CAPS
1.5000 mg | ORAL_CAPSULE | Freq: Every day | ORAL | 2 refills | Status: DC
Start: 2020-08-06 — End: 2020-11-06

## 2020-08-06 MED ORDER — ESCITALOPRAM OXALATE 20 MG PO TABS: 20.0000 mg | ORAL_TABLET | Freq: Two times a day (BID) | ORAL | 2 refills | Status: DC

## 2020-08-06 NOTE — Progress Notes (Signed)
Virtual Visit via Telephone Note  I connected with Loretta Padilla on 08/06/20 at 11:20 AM EST by telephone and verified that I am speaking with the correct person using two identifiers.  Location: Patient: home Provider: home   I discussed the limitations, risks, security and privacy concerns of performing an evaluation and management service by telephone and the availability of in person appointments. I also discussed with the patient that there may be a patient responsible charge related to this service. The patient expressed understanding and agreed to proceed.    I discussed the assessment and treatment plan with the patient. The patient was provided an opportunity to ask questions and all were answered. The patient agreed with the plan and demonstrated an understanding of the instructions.   The patient was advised to call back or seek an in-person evaluation if the symptoms worsen or if the condition fails to improve as anticipated.  I provided 15 minutes of non-face-to-face time during this encounter.   Diannia Ruder, MD  [facility] MD/PA/NP OP Progress Note  08/06/2020 11:25 AM Loretta Padilla  MRN:  [id number]  Chief Complaint:  Chief Complaint    Depression; Anxiety; Follow-up     HPI: This patient is a 34 year old divorced white female who is with her children and boyfriend in [place].  She is on disability for seizure disorder.  The patient returns after about 4 weeks.  She called about 3 weeks ago stating that she had become more depressed and did not think that the Prozac was working.  She requested to switch to Lexapro.  She is now on Lexapro 20 mg twice daily.  She states that she is feeling much better.  She has better energy she is sleeping better at night and fewer panic attacks through the day.  She has not had any further seizures.  She states that she is no longer having mood swings and her mood is stable.  She denies thoughts of suicide or self-harm. Visit  Diagnosis:    ICD-10-CM   1. Moderate episode of recurrent major depressive disorder (HCC)  F33.1     Past Psychiatric History: Longtime outpatient treatment for depression  Past Medical History:  Past Medical History:  Diagnosis Date  . Anxiety   . Back pain   . Depression   . Kidney stones   . Seizures (HCC)     Past Surgical History:  Procedure Laterality Date  . IMPLANTATION VAGAL NERVE STIMULATOR    . TUBAL LIGATION N/A 10/10/2018   Procedure: POST PARTUM TUBAL LIGATION;  Surgeon: Hermina Staggers, MD;  Location: [facility] BIRTHING SUITES;  Service: Gynecology;  Laterality: N/A;  . VSD REPAIR      Family Psychiatric History: see below  Family History:  Family History  Problem Relation Age of Onset  . Bipolar disorder Mother   . Alcohol abuse Mother   . Depression Sister   . Anxiety disorder Sister   . Alcohol abuse Maternal Grandfather   . Diabetes Maternal Grandfather   . Heart attack Maternal Grandfather   . Alcohol abuse Paternal Grandfather   . Heart disease Paternal Grandfather   . Other Father        unsure of history  . Brain cancer Father        Glioblastoma  . Asthma Son   . Other Son        EOE  . Colon cancer Maternal Grandmother     Social History:  Social History   Socioeconomic History  .  Marital status: Divorced    Spouse name: Not on file  . Number of children: 3  . Years of education: 3 years college  . Highest education level: Not on file  Occupational History  . Occupation: Disabled  Tobacco Use  . Smoking status: Current Every Day Smoker    Packs/day: 1.00  . Smokeless tobacco: Never Used  Vaping Use  . Vaping Use: Never used  Substance and Sexual Activity  . Alcohol use: Not Currently    Comment: occ., 05-07-2017 per pt 2-3 times a mth  . Drug use: No  . Sexual activity: Yes    Birth control/protection: Surgical    Comment: tubal  Other Topics Concern  . Not on file  Social History Narrative   Lives at home with boyfriend and  children.   Right-handed.   Occasional use of caffeine.      Social Determinants of Health   Financial Resource Strain:   . Difficulty of Paying Living Expenses: Not on file  Food Insecurity:   . Worried About Programme researcher, broadcasting/film/video in the Last Year: Not on file  . Ran Out of Food in the Last Year: Not on file  Transportation Needs:   . Lack of Transportation (Medical): Not on file  . Lack of Transportation (Non-Medical): Not on file  Physical Activity:   . Days of Exercise per Week: Not on file  . Minutes of Exercise per Session: Not on file  Stress:   . Feeling of Stress : Not on file  Social Connections:   . Frequency of Communication with Friends and Family: Not on file  . Frequency of Social Gatherings with Friends and Family: Not on file  . Attends Religious Services: Not on file  . Active Member of Clubs or Organizations: Not on file  . Attends Banker Meetings: Not on file  . Marital Status: Not on file    Allergies:  Allergies  Allergen Reactions  . Adhesive [Tape]     Birth control patch and nicoderm patch   . Carbatrol [Carbamazepine] Rash  . Dilantin [Phenytoin Sodium Extended] Rash  . Tizanidine Hcl Rash  . Venlafaxine Rash    Metabolic Disorder Labs: No results found for: HGBA1C, MPG No results found for: PROLACTIN No results found for: CHOL, TRIG, HDL, CHOLHDL, VLDL, LDLCALC Lab Results  Component Value Date   TSH 2.940 05/20/2020   TSH 2.370 03/18/2020    Therapeutic Level Labs: No results found for: LITHIUM No results found for: VALPROATE No components found for:  CBMZ  Current Medications: Current Outpatient Medications  Medication Sig Dispense Refill  . cariprazine (VRAYLAR) capsule Take 1 capsule (1.5 mg total) by mouth daily. 30 capsule 2  . clonazePAM (KLONOPIN) 0.5 MG tablet Take 1 tablet (0.5 mg total) by mouth 2 (two) times daily as needed. for anxiety 60 tablet 2  . escitalopram (LEXAPRO) 20 MG tablet Take 1 tablet (20 mg  total) by mouth 2 (two) times daily. 60 tablet 2  . ondansetron (ZOFRAN ODT) 4 MG disintegrating tablet Take 1 tablet (4 mg total) by mouth every 8 (eight) hours as needed. 20 tablet 6  . phentermine 15 MG capsule Take 1 capsule (15 mg total) by mouth every morning. 30 capsule 0  . SUMAtriptan (IMITREX) 100 MG tablet Take 1 tablet (100 mg total) by mouth once as needed for up to 1 dose for migraine. May repeat in 2 hours if headache persists or recurs. 30 tablet 0  . topiramate (  TOPAMAX) 100 MG tablet Take 3 tablets (300 mg total) by mouth 2 (two) times daily. 540 tablet 4  . zolpidem (AMBIEN CR) 12.5 MG CR tablet Take 1 tablet (12.5 mg total) by mouth at bedtime as needed for sleep. 30 tablet 2   No current facility-administered medications for this visit.     Musculoskeletal: Strength & Muscle Tone: within normal limits Gait & Station: normal Patient leans: N/A  Psychiatric Specialty Exam: Review of Systems  All other systems reviewed and are negative.   not currently breastfeeding.There is no height or weight on file to calculate BMI.  General Appearance: NA  Eye Contact:  NA  Speech:  Clear and Coherent  Volume:  Normal  Mood:  Euthymic  Affect:  NA  Thought Process:  Goal Directed  Orientation:  Full (Time, Place, and Person)  Thought Content: WDL   Suicidal Thoughts:  No  Homicidal Thoughts:  No  Memory:  Immediate;   Good Recent;   Good Remote;   Good  Judgement:  Good  Insight:  Good  Psychomotor Activity:  Normal  Concentration:  Concentration: Good and Attention Span: Good  Recall:  Good  Fund of Knowledge: Good  Language: Good  Akathisia:  No  Handed:  Right  AIMS (if indicated): not done  Assets:  Communication Skills Desire for Improvement Resilience Social Support Talents/Skills  ADL's:  Intact  Cognition: WNL  Sleep:  Good   Screenings: PHQ2-9     Initial Prenatal from 03/15/2018 in [facility]  PHQ-2 Total Score 1  PHQ-9 Total Score 9        Assessment and Plan: This patient is a 34 year old female with a history of seizure disorder depression mood swings and anxiety.  She is doing better with Lexapro 20 mg twice daily for depression.  She will continue this as well as Vraylar 1.5 mg daily for mood swings, clonazepam 0.5 mg twice daily as needed for anxiety and Ambien CR 12.5 mg at bedtime for sleep.  She will return to see me in 3 months   Diannia Ruder, MD 08/06/2020, 11:25 AM

## 2020-08-19 ENCOUNTER — Telehealth: Payer: Self-pay | Admitting: Neurology

## 2020-08-19 NOTE — Telephone Encounter (Signed)
Reports having three seizures over the weekend. Denies missing any medications. She is taking topiramate 100mg , three tablets BID. She has a VNS in place.   She has been scheduled for an early follow up with Dr. on 08/20/20.

## 2020-08-19 NOTE — Telephone Encounter (Signed)
Pt called wanting to inform provider and RN that over the weekend she experienced 3 seizures and one of them was like no other she had in the past. Please advise.

## 2020-08-20 ENCOUNTER — Ambulatory Visit (INDEPENDENT_AMBULATORY_CARE_PROVIDER_SITE_OTHER): Payer: Medicare Other | Admitting: Neurology

## 2020-08-20 ENCOUNTER — Other Ambulatory Visit: Payer: Self-pay

## 2020-08-20 ENCOUNTER — Telehealth: Payer: Self-pay | Admitting: Neurology

## 2020-08-20 ENCOUNTER — Encounter: Payer: Self-pay | Admitting: Neurology

## 2020-08-20 VITALS — BP 111/76 | HR 107 | Ht 64.0 in | Wt 167.5 lb

## 2020-08-20 DIAGNOSIS — F32A Depression, unspecified: Secondary | ICD-10-CM

## 2020-08-20 DIAGNOSIS — R569 Unspecified convulsions: Secondary | ICD-10-CM

## 2020-08-20 DIAGNOSIS — G43709 Chronic migraine without aura, not intractable, without status migrainosus: Secondary | ICD-10-CM

## 2020-08-20 DIAGNOSIS — Z79899 Other long term (current) drug therapy: Secondary | ICD-10-CM | POA: Diagnosis not present

## 2020-08-20 DIAGNOSIS — G40219 Localization-related (focal) (partial) symptomatic epilepsy and epileptic syndromes with complex partial seizures, intractable, without status epilepticus: Secondary | ICD-10-CM

## 2020-08-20 MED ORDER — LAMOTRIGINE 25 MG PO TABS: ORAL_TABLET | ORAL | 0 refills | Status: DC

## 2020-08-20 MED ORDER — ONDANSETRON 4 MG PO TBDP: 4.0000 mg | ORAL_TABLET | Freq: Three times a day (TID) | ORAL | 6 refills | Status: AC | PRN

## 2020-08-20 MED ORDER — SUMATRIPTAN SUCCINATE 100 MG PO TABS: 100.0000 mg | ORAL_TABLET | Freq: Once | ORAL | 6 refills | Status: AC | PRN

## 2020-08-20 MED ORDER — TIZANIDINE HCL 4 MG PO TABS
4.0000 mg | ORAL_TABLET | Freq: Four times a day (QID) | ORAL | 6 refills | Status: DC | PRN
Start: 2020-08-20 — End: 2021-01-10

## 2020-08-20 MED ORDER — MELOXICAM 7.5 MG PO TABS: 7.5000 mg | ORAL_TABLET | Freq: Every day | ORAL | 6 refills | Status: DC | PRN

## 2020-08-20 MED ORDER — LAMOTRIGINE ER 100 MG PO TB24: 100.0000 mg | ORAL_TABLET | Freq: Every day | ORAL | 11 refills | Status: DC

## 2020-08-20 NOTE — Telephone Encounter (Signed)
[employer] medicare/medicaid order sent to [facility]. No auth they will reach out to the patient to schedule.  °

## 2020-08-20 NOTE — Progress Notes (Signed)
Chief Complaint  Patient presents with  . Seizures    Reports having three seizures over the weekend. Denies missing any medications. She is taking topiramate 100mg , three tablets BID. She has a VNS in place.     HISTORICAL  HISTORY OF PRESENT ILLNESS: Loretta Padilla is a 34 year old female, accompanied by her boyfriend Loretta Padilla, seen in refer by her primary care doctor  Loretta Padilla, Loretta Padilla, for evaluation of seizure, initial evaluation was October 8th 2018.  I reviewed and summarized the referring note, she had a past medical history of depression, epilepsy, is taking Topamax 100 mg, 2 tablets twice a day, [place] recently [place] to [place] in June 2018,  She was previously under the care of local neurologist Loretta Padilla. [place], Loretta Padilla (Phone: [phone]; Fax:  [phone]; [facility]- [phone] [address] [phone] [address], [address] [place], [place] [id number]).  She reported a history of epilepsy since 34 years old, she used to have generalized tonic-clonic seizure, tried different medications, has been on stable dose of Topamax 100 mg 2 tablets twice a day for many years, but because of frequent recurrent seizures, eventually had VNS placement in December 2014, she reported mild improvement with the VNS, post surgical one year, she has not had generalized seizure, but began to have recurrent smaller spells, staring into the space, unresponsive for few minutes, she began to have increased spells again 2017, now having a spell on a monthly basis, rarely generalized tonic-clonic seizures,  Recent adjustment for her VNS was in June 2018, she noticed wearing off every 5 minutes, with mild left neck pain,   I reviewed the laboratory evaluation in September 2018: Normal CMP, CBC, hemoglobin of 13.2  UPDATE Nov 04 2017:YY She is now taking topamax 100mg  2 tab bid, onfi 20mg  qhs since Oct 2018,   She was having "silenct seizure' when she runs out of  her Topamax 400 mg daily, she has dizziness, lightheadedness, staring off into space, confused, lasting for few second, no GTC events.  Those small seizure-like spells can happen multiple times a day.  Add on onfi 20 mg every night has made a difference, she no longer has recurrent generalized tonic-clonic seizure, before that, she was having it on a monthly basis, She is also taking Prozac for depression, and other simple medication from her psychiatrist office, complains of chronic insomnia,  Today we also performed a VNS interrogation, adjustment, she complains of could not tolerate magnet stimulation  Update June 4th 2019: She has children at age 34, 4710, 215, she was taking on topamax while 34 years old has GI issue, 13, health, 5 years ferile seizure.  Last peroid was on May 1, 2nd.  Before that was March 24, 25, 26th.  Home preganncy test was positive  She has not has grandmal seizure, for a long times,     UPDATE Sept 19 2019: She had one seizure on May 24, 2018, feel it coming on, felt dizzy, spinning sensation, loss of consciousness bed, heard her left foot, she is currently [redacted] weeks pregnant, is going to have a boy, due date is on Oct 10 2018.  She continue have smaller spells, staring, unresponsiveness, taking Topamax 100 mg 2 tablets twice a day," more seizure medications did not make any difference"  She could not tolerate strong VNS settings, complains of throat pain  Virtual Visit via Video on Jan 16 2020  She has normal healthy delivery on October 10, 2018, continue have intermittent staring  spells, prior to the pregnancy she was taking Topamax 100 mg 2 tablets twice a day, during pregnancy, dosage was increased to 100 mg 3 tablets twice a day  She is tolerating the medication well, continue have intermittent staring spells, she is not breast-feeding,  UPDATE Sept 13 2021: She is overall doing well, has not had recurrent generalized seizure, couple weeks ago, working  hot summer sun, she had transient dizziness, improved by staying in [facility] room, drink some water,  She is tolerating Topamax 100 mg 3 tablets twice a day, no significant side effect noted,  Her bipolar disorder is under good control with current medications, and Abilify 5 mg daily, Vralar 15 mg daily, clonazepam 0.5 mg twice a day as needed, Ambien as needed  She is recently started on phentermine for weight loss, is considering Chantix for smoking cessation, she currently smokes 1 pack a day, previously tried nicotine patch, asking allergic reaction, tried chewing gum without helping her symptoms,  EEG was normal in May 2021  UPDATE Aug 20 2020: Reported 2 seizures, Friday August 16, 2020, wake up from sleep, whole body ache, had a generalized tonic-clonic seizure, and one more on Friday, and 1 Saturday, without warning signs, she lost control of her posturing, fell into wash machine, then basket, complains of whole body achy pain, but there was no total loss of consciousness, lasting less than 1 minute, She has been compliant with her Topamax 100 mg 3 tablets 3 times a day, previous seizure was reported in February 2021,  She also complains of frequent headaches, 2-3 times each week, suboptimal control to Imitrex  She desires MRI brain study, reported previous study at outside facility, but we do not have imaging on fire  We also adjust VNS setting today  REVIEW OF SYSTEMS: Full 14 system review of systems performed and notable only for as above All other review of systems were negative.  ALLERGIES: Allergies  Allergen Reactions  . Adhesive [Tape]     Birth control patch and nicoderm patch   . Carbatrol [Carbamazepine] Rash  . Dilantin [Phenytoin Sodium Extended] Rash  . Tizanidine Hcl Rash  . Venlafaxine Rash     HOME MEDICATIONS: Current Outpatient Medications  Medication Sig Dispense Refill  . cariprazine (VRAYLAR) capsule Take 1 capsule (1.5 mg total) by mouth daily. 30  capsule 2  . clonazePAM (KLONOPIN) 0.5 MG tablet Take 1 tablet (0.5 mg total) by mouth 2 (two) times daily as needed. for anxiety 60 tablet 2  . escitalopram (LEXAPRO) 20 MG tablet Take 1 tablet (20 mg total) by mouth 2 (two) times daily. 60 tablet 2  . ondansetron (ZOFRAN ODT) 4 MG disintegrating tablet Take 1 tablet (4 mg total) by mouth every 8 (eight) hours as needed. 20 tablet 6  . SUMAtriptan (IMITREX) 100 MG tablet Take 1 tablet (100 mg total) by mouth once as needed for up to 1 dose for migraine. May repeat in 2 hours if headache persists or recurs. 30 tablet 0  . topiramate (TOPAMAX) 100 MG tablet Take 3 tablets (300 mg total) by mouth 2 (two) times daily. 540 tablet 4  . zolpidem (AMBIEN CR) 12.5 MG CR tablet Take 1 tablet (12.5 mg total) by mouth at bedtime as needed for sleep. 30 tablet 2   No current facility-administered medications for this visit.    PAST MEDICAL HISTORY: Past Medical History:  Diagnosis Date  . Anxiety   . Back pain   . Depression   . Kidney stones   .  Seizures (HCC)     PAST SURGICAL HISTORY: Past Surgical History:  Procedure Laterality Date  . IMPLANTATION VAGAL NERVE STIMULATOR    . TUBAL LIGATION N/A 10/10/2018   Procedure: POST PARTUM TUBAL LIGATION;  Surgeon: Hermina Staggers, Loretta Padilla;  Location: [facility]IRTHING SU[facility];  Service: Gynecology;  Laterality: N/A;  . VSD REPAIR      FAMILY HISTORY: Family History  Problem Relation Age of Onset  . Bipolar disorder Mother   . Alcohol abuse Mother   . Depression Sister   . Anxiety disorder Sister   . Alcohol abuse Maternal Grandfather   . Diabetes Maternal Grandfather   . Heart attack Maternal Grandfather   . Alcohol abuse Paternal Grandfather   . Heart disease Paternal Grandfather   . Other Father        unsure of history  . Brain cancer Father        Glioblastoma  . Asthma Son   . Other Son        EOE  . Colon cancer Maternal Grandmother     SOCIAL HISTORY: Social History   Socioeconomic  History  . Marital status: Divorced    Spouse name: Not on file  . Number of children: 3  . Years of education: 3 years college  . Highest education level: Not on file  Occupational History  . Occupation: Disabled  Tobacco Use  . Smoking status: Current Every Day Smoker    Packs/day: 1.00  . Smokeless tobacco: Never Used  Vaping Use  . Vaping Use: Never used  Substance and Sexual Activity  . Alcohol use: Not Currently    Comment: occ., 05-07-2017 per pt 2-3 times a mth  . Drug use: No  . Sexual activity: Yes    Birth control/protection: Surgical    Comment: tubal  Other Topics Concern  . Not on file  Social History Narrative   Lives at home with boyfriend and children.   Right-handed.   Occasional use of caffeine.      Social Determinants of Health   Financial Resource Strain: Not on file  Food Insecurity: Not on file  Transportation Needs: Not on file  Physical Activity: Not on file  Stress: Not on file  Social Connections: Not on file  Intimate Partner Violence: Not on file     PHYSICAL EXAM   Vitals:   08/20/20 1304  BP: 111/76  Pulse: (!) 107  Weight: 167 lb 8 oz (76 kg)  Height: 5\' 4"  (1.626 m)   Not recorded     Body mass index is 28.75 kg/m.  PHYSICAL EXAMNIATION:  Gen: NAD, conversant, well nourised, well groomed                     Cardiovascular: Regular rate rhythm, no peripheral edema, warm, nontender. Eyes: Conjunctivae clear without exudates or hemorrhage Neck: Supple, no carotid bruits. Pulmonary: Clear to auscultation bilaterally   NEUROLOGICAL EXAM:  MENTAL STATUS: Speech/cognition Awake alert oriented to history taking and casual conversation   CRANIAL NERVES: CN II: Visual fields are full to confrontation. Pupils are round equal and briskly reactive to light. CN III, IV, VI: extraocular movement are normal. No ptosis. CN V: Facial sensation is intact to light touch CN VII: Face is symmetric with normal eye closure  CN  VIII: Hearing is normal to causal conversation. CN IX, X: Phonation is normal. CN XI: Head turning and shoulder shrug are intact  MOTOR: There is no pronator drift of out-stretched arms.  Muscle bulk and tone are normal. Muscle strength is normal.  REFLEXES: Reflexes are 2+ and symmetric at the biceps, triceps, knees, and ankles. Plantar responses are flexor.  SENSORY: Intact to light touch, pinprick and vibratory sensation are intact in fingers and toes.  COORDINATION: There is no trunk or limb dysmetria noted.  GAIT/STANCE: Posture is normal. Gait is steady with normal steps, base, arm swing, and turning. Heel and toe walking are normal. Tandem gait is normal.  Romberg is absent.   DIAGNOSTIC DATA (LABS, IMAGING, TESTING) - I reviewed patient records, labs, notes, testing and imaging myself where available.   ASSESSMENT AND PLAN  Loretta Padilla is a 34 y.o. female   Epilepsy  Under good control with current medications, keep Topamax 100 mg 3 tablets twice a day, refilled her prescription,  EEG was normal in May 2021  Laboratory evaluation in September 2021 showed Topamax level was 8.2  She was transferred from [place], no image information on file  Proceed with MRI of the brain with without contrast, need to turn off VNS stimulation before MRI  Add on lamotrigine titrating to ER 100 mg every night as antiepileptic medications    Chronic migraine headaches  Imitrex plus Zofran, plus Aleve, tizanidine as needed for prolonged headaches,  Tobacco abuse  She raised the question about using Chantix as smoking cessation, I do not see absolute contraindication.   VNS:  Model 106  Generator Serial No. [id number]  Normal OutPut Current: 1.5 mA --increase from 1.25 mA Signal Frequency: 20 Hz  Pulse Width:  130  sec Signal on time: 30 seconds Signal off time:  1.1 minute --decrease from 1.37m   Magnetic Output Current: 1.75  MA--increase from 1.5 mA Pulse Width: 130  sec Signal On Time:  60 Sec Diagnostic Impedance: 3430 OHMS  Levert Feinstein, M.D. Ph.D.  [facility] [address] [place], [place] [address] Ph: [phone] Fax: [phone]  CC:  Shon Hale, Loretta Padilla [address] [place],  [place] [address]  Shon Hale, Loretta Padilla

## 2020-08-21 ENCOUNTER — Telehealth: Payer: Self-pay | Admitting: *Deleted

## 2020-08-21 NOTE — Telephone Encounter (Signed)
[id number] approved through 09/06/2021.

## 2020-08-21 NOTE — Telephone Encounter (Signed)
PA for lamotrigine ER 100mg  tablets started on covermymeds (key: [id number]). Pt has pharmacy coverage with [employer] ). Expedited review requested. Decision pending.

## 2020-09-18 ENCOUNTER — Encounter: Payer: Self-pay | Admitting: Emergency Medicine

## 2020-09-18 ENCOUNTER — Other Ambulatory Visit: Payer: Self-pay

## 2020-09-18 ENCOUNTER — Ambulatory Visit
Admission: EM | Admit: 2020-09-18 | Discharge: 2020-09-18 | Disposition: A | Discharge status: Home / Self Care | Payer: Medicare Other | Attending: Family Medicine | Admitting: Family Medicine

## 2020-09-18 DIAGNOSIS — R0981 Nasal congestion: Secondary | ICD-10-CM

## 2020-09-18 DIAGNOSIS — J069 Acute upper respiratory infection, unspecified: Secondary | ICD-10-CM | POA: Diagnosis not present

## 2020-09-18 DIAGNOSIS — J3489 Other specified disorders of nose and nasal sinuses: Secondary | ICD-10-CM | POA: Diagnosis not present

## 2020-09-18 DIAGNOSIS — Z1152 Encounter for screening for COVID-19: Secondary | ICD-10-CM

## 2020-09-18 MED ORDER — AZITHROMYCIN 250 MG PO TABS: 250.0000 mg | ORAL_TABLET | Freq: Every day | ORAL | 0 refills | Status: DC

## 2020-09-18 NOTE — Discharge Instructions (Signed)
I have sent in azithromycin for you to take. Take 2 tablets today, then one tablet daily for the next 4 days.  Your COVID test is pending.  You should self quarantine until the test result is back.    Take Tylenol or ibuprofen as needed for fever or discomfort.  Rest and keep yourself hydrated.    Follow-up with your primary care provider if your symptoms are not improving.

## 2020-09-18 NOTE — ED Triage Notes (Signed)
Pt presents with sore throat, bilateral ear pain and sinus congestion x 12 days.

## 2020-09-20 LAB — COVID-19, FLU A+B NAA
Influenza A, NAA: NOT DETECTED
Influenza B, NAA: NOT DETECTED
SARS-CoV-2, NAA: DETECTED — AB

## 2020-09-20 NOTE — ED Provider Notes (Signed)
[facility]   [id number] 09/18/20 Arrival Time: 1305   CC: COVID symptoms  SUBJECTIVE: History from: patient.  Loretta Padilla is a 35 y.o. female who presents with sore throat, bilateral otalgia, sinus congestion, sinus pain for the last 12 days. Denies sick exposure to COVID, flu or strep. Denies recent travel. Has positive history of Covid. Has not completed Covid vaccines. Has not completed flu vaccine this year. Has not taken OTC medications for this. There are no aggravating or alleviating factors. Reports  previous symptoms in the past with Covid and with sinus infection. Denies fever, chills, fatigue, SOB, wheezing, chest pain, nausea, changes in bowel or bladder habits.    ROS: As per HPI.  All other pertinent ROS negative.     Past Medical History:  Diagnosis Date  . Anxiety   . Back pain   . Depression   . Kidney stones   . Seizures (HCC)    Past Surgical History:  Procedure Laterality Date  . IMPLANTATION VAGAL NERVE STIMULATOR    . TUBAL LIGATION N/A 10/10/2018   Procedure: POST PARTUM TUBAL LIGATION;  Surgeon: Hermina Staggers, MD;  Location: [facility]TH[facility] SU[facility];  Service: Gynecology;  Laterality: N/A;  . VSD REPAIR     Allergies  Allergen Reactions  . Adhesive [Tape]     Birth control patch and nicoderm patch   . Carbatrol [Carbamazepine] Rash  . Dilantin [Phenytoin Sodium Extended] Rash  . Tizanidine Hcl Rash  . Venlafaxine Rash   No current facility-administered medications on file prior to encounter.   Current Outpatient Medications on File Prior to Encounter  Medication Sig Dispense Refill  . cariprazine (VRAYLAR) capsule Take 1 capsule (1.5 mg total) by mouth daily. 30 capsule 2  . clonazePAM (KLONOPIN) 0.5 MG tablet Take 1 tablet (0.5 mg total) by mouth 2 (two) times daily as needed. for anxiety 60 tablet 2  . escitalopram (LEXAPRO) 20 MG tablet Take 1 tablet (20 mg total) by mouth 2 (two) times daily. 60 tablet 2  . lamoTRIgine (LAMICTAL)  25 MG tablet 1 tab bid x one week 2 tab bid x 2nd week 42 tablet 0  . LamoTRIgine 100 MG TB24 24 hour tablet Take 1 tablet (100 mg total) by mouth at bedtime. 30 tablet 11  . meloxicam (MOBIC) 7.5 MG tablet Take 1 tablet (7.5 mg total) by mouth daily as needed for pain. 30 tablet 6  . ondansetron (ZOFRAN ODT) 4 MG disintegrating tablet Take 1 tablet (4 mg total) by mouth every 8 (eight) hours as needed. 20 tablet 6  . SUMAtriptan (IMITREX) 100 MG tablet Take 1 tablet (100 mg total) by mouth once as needed for up to 1 dose for migraine. May repeat in 2 hours if headache persists or recurs. 12 tablet 6  . tiZANidine (ZANAFLEX) 4 MG tablet Take 1 tablet (4 mg total) by mouth every 6 (six) hours as needed for muscle spasms. 30 tablet 6  . topiramate (TOPAMAX) 100 MG tablet Take 3 tablets (300 mg total) by mouth 2 (two) times daily. 540 tablet 4  . zolpidem (AMBIEN CR) 12.5 MG CR tablet Take 1 tablet (12.5 mg total) by mouth at bedtime as needed for sleep. 30 tablet 2   Social History   Socioeconomic History  . Marital status: Divorced    Spouse name: Not on file  . Number of children: 3  . Years of education: 3 years college  . Highest education level: Not on file  Occupational History  .  Occupation: Disabled  Tobacco Use  . Smoking status: Current Every Day Smoker    Packs/day: 1.00  . Smokeless tobacco: Never Used  Vaping Use  . Vaping Use: Never used  Substance and Sexual Activity  . Alcohol use: Not Currently    Comment: occ., 05-07-2017 per pt 2-3 times a mth  . Drug use: No  . Sexual activity: Yes    Birth control/protection: Surgical    Comment: tubal  Other Topics Concern  . Not on file  Social History Narrative   Lives at home with boyfriend and children.   Right-handed.   Occasional use of caffeine.      Social Determinants of Health   Financial Resource Strain: Not on file  Food Insecurity: Not on file  Transportation Needs: Not on file  Physical Activity: Not on  file  Stress: Not on file  Social Connections: Not on file  Intimate Partner Violence: Not on file   Family History  Problem Relation Age of Onset  . Bipolar disorder Mother   . Alcohol abuse Mother   . Depression Sister   . Anxiety disorder Sister   . Alcohol abuse Maternal Grandfather   . Diabetes Maternal Grandfather   . Heart attack Maternal Grandfather   . Alcohol abuse Paternal Grandfather   . Heart disease Paternal Grandfather   . Other Father        unsure of history  . Brain cancer Father        Glioblastoma  . Asthma Son   . Other Son        EOE  . Colon cancer Maternal Grandmother     OBJECTIVE:  Vitals:   09/18/20 1424 09/18/20 1428  BP:  108/70  Pulse:  83  Resp:  18  Temp:  98.2 F (36.8 C)  TempSrc:  Oral  SpO2:  98%  Weight: 175 lb (79.4 kg)   Height: 5\' 4"  (1.626 m)      General appearance: alert; appears fatigued, but nontoxic; speaking in full sentences and tolerating own secretions HEENT: NCAT; Ears: EACs clear, TMs pearly gray; Eyes: PERRL.  EOM grossly intact. Sinuses: nontender; Nose: nares patent with clear rhinorrhea, Throat: oropharynx erythematous, cobblestoning present, tonsils non erythematous or enlarged, uvula midline  Neck: supple without LAD Lungs: unlabored respirations, symmetrical air entry; cough: absent; no respiratory distress; CTAB Heart: regular rate and rhythm.  Radial pulses 2+ symmetrical bilaterally Skin: warm and dry Psychological: alert and cooperative; normal mood and affect  LABS:  No results found for this or any previous visit (from the past 24 hour(s)).   ASSESSMENT & PLAN:  1. Upper respiratory tract infection, unspecified type   2. Encounter for screening for COVID-19   3. Nasal congestion   4. Rhinorrhea   5. Sinus pressure     Meds ordered this encounter  Medications  . azithromycin (ZITHROMAX) 250 MG tablet    Sig: Take 1 tablet (250 mg total) by mouth daily. Take first 2 tablets together, then 1  every day until finished.    Dispense:  6 tablet    Refill:  0    Order Specific Question:   Supervising Provider    Answer:   Merrilee Jansky   Prescribed azithromycin for URI given length of symptoms Take this medication if viral testing is negative and symptoms are still present when tests result. Continue supportive care at home COVID and flu testing ordered.  It will take between 2-3 days for test results. Someone  will contact you regarding abnormal results.   Work note provided Patient should remain in quarantine until they have received Covid results.  If negative you may resume normal activities (go back to work/school) while practicing hand hygiene, social distance, and mask wearing.  If positive, patient should remain in quarantine for at least 5 days from symptom onset AND greater than 72 hours after symptoms resolution (absence of fever without the use of fever-reducing medication and improvement in respiratory symptoms), whichever is longer Get plenty of rest and push fluids Use OTC zyrtec for nasal congestion, runny nose, and/or sore throat Use OTC flonase for nasal congestion and runny nose Use medications daily for symptom relief Use OTC medications like ibuprofen or tylenol as needed fever or pain Call or go to the ED if you have any new or worsening symptoms such as fever, worsening cough, shortness of breath, chest tightness, chest pain, turning blue, changes in mental status.  Reviewed expectations re: course of current medical issues. Questions answered. Outlined signs and symptoms indicating need for more acute intervention. Patient verbalized understanding. After Visit Summary given.         Moshe Cipro, NP 09/20/20 1318

## 2020-09-22 ENCOUNTER — Other Ambulatory Visit: Payer: Self-pay

## 2020-09-22 ENCOUNTER — Encounter (HOSPITAL_COMMUNITY): Payer: Self-pay | Admitting: Emergency Medicine

## 2020-09-22 ENCOUNTER — Emergency Department (HOSPITAL_COMMUNITY)
Admission: EM | Admit: 2020-09-22 | Discharge: 2020-09-22 | Disposition: A | Discharge status: Home / Self Care | Payer: Medicare Other | Attending: Emergency Medicine | Admitting: Emergency Medicine

## 2020-09-22 DIAGNOSIS — R52 Pain, unspecified: Secondary | ICD-10-CM | POA: Diagnosis not present

## 2020-09-22 DIAGNOSIS — U071 COVID-19: Secondary | ICD-10-CM | POA: Diagnosis not present

## 2020-09-22 DIAGNOSIS — R109 Unspecified abdominal pain: Secondary | ICD-10-CM | POA: Diagnosis not present

## 2020-09-22 DIAGNOSIS — M546 Pain in thoracic spine: Secondary | ICD-10-CM | POA: Diagnosis not present

## 2020-09-22 DIAGNOSIS — Z743 Need for continuous supervision: Secondary | ICD-10-CM | POA: Diagnosis not present

## 2020-09-22 DIAGNOSIS — R6889 Other general symptoms and signs: Secondary | ICD-10-CM | POA: Diagnosis not present

## 2020-09-22 DIAGNOSIS — M545 Low back pain, unspecified: Secondary | ICD-10-CM | POA: Diagnosis not present

## 2020-09-22 DIAGNOSIS — F17200 Nicotine dependence, unspecified, uncomplicated: Secondary | ICD-10-CM | POA: Insufficient documentation

## 2020-09-22 DIAGNOSIS — M549 Dorsalgia, unspecified: Secondary | ICD-10-CM | POA: Diagnosis not present

## 2020-09-22 MED ORDER — CYCLOBENZAPRINE HCL 10 MG PO TABS: 10.0000 mg | ORAL_TABLET | Freq: Three times a day (TID) | ORAL | 0 refills | Status: AC | PRN

## 2020-09-22 MED ORDER — DIAZEPAM 5 MG PO TABS
5.0000 mg | ORAL_TABLET | Freq: Once | ORAL | Status: AC
  Administered 2020-09-22: 5 mg via ORAL
  Filled 2020-09-22: qty 1

## 2020-09-22 MED ORDER — NAPROXEN 500 MG PO TABS: ORAL_TABLET | ORAL | 0 refills | Status: AC

## 2020-09-22 MED ORDER — KETOROLAC TROMETHAMINE 30 MG/ML IJ SOLN
30.0000 mg | Freq: Once | INTRAMUSCULAR | Status: AC
  Administered 2020-09-22: 30 mg via INTRAMUSCULAR
  Filled 2020-09-22: qty 1

## 2020-09-22 MED ORDER — DEXAMETHASONE SODIUM PHOSPHATE 10 MG/ML IJ SOLN
10.0000 mg | Freq: Once | INTRAMUSCULAR | Status: AC
  Administered 2020-09-22: 10 mg via INTRAMUSCULAR
  Filled 2020-09-22: qty 1

## 2020-09-22 NOTE — ED Triage Notes (Signed)
Pt c/o back pain that started after trying to move a dryer.

## 2020-09-22 NOTE — Discharge Instructions (Addendum)
Use ice and heat for comfort.  Take the medication as prescribed.  Do not take the meloxicam in the tizanidine while taking the new medications.  Please follow-up with your primary care doctor if you are not improving over the next week.

## 2020-09-22 NOTE — ED Provider Notes (Addendum)
[facility] EMERGENCY DEPARTMENT Provider Note   CSN: [id number] Arrival date & time: 09/22/20  0020   Time seen 1:05 AM  History Chief Complaint  Patient presents with  . Back Pain    Loretta Padilla is a 35 y.o. female.  HPI   Patient states earlier today, January 15 she was trying to move a dryer.  She states she had it on a dolly and she tried to get it up some stairs a couple of times without success since she stopped.  She states about 530 this afternoon she started having mid and lower back pain and pain on the sides of her abdomen.  She states the pain is sharp and aching.  It hurts more when she breathes deep or moves.  Nothing makes it feel better.  She states it is similar to pain she has had before but this is worse.  She denies any urinary or rectal incontinence.  She has taken meloxicam, Zanaflex, and Tylenol PM without result.  She presents today via EMS.  Patient was seen in the ED on January 12 and tested positive for COVID.  PCP Shon Hale, MD   Past Medical History:  Diagnosis Date  . Anxiety   . Back pain   . Depression   . Kidney stones   . Seizures [facility])     Patient Active Problem List   Diagnosis Date Noted  . Seizures (HCC) 05/20/2020  . Chronic migraine without aura without status migrainosus, not intractable 05/20/2020  . Polypharmacy 05/20/2020  . Body mass index 27.0-27.9, adult 04/03/2020  . Weight loss counseling, encounter for 04/03/2020  . Abnormal uterine bleeding (AUB) 03/18/2020  . Lactation symptom 03/18/2020  . History of bilateral tubal ligation 11/15/2018  . Insomnia 03/15/2018  . Epilepsy (HCC) 11/23/2017  . Smoker 08/03/2017  . Depression 05/07/2017    Past Surgical History:  Procedure Laterality Date  . IMPLANTATION VAGAL NERVE STIMULATOR    . TUBAL LIGATION N/A 10/10/2018   Procedure: POST PARTUM TUBAL LIGATION;  Surgeon: Hermina Staggers, MD;  Location: [facility] BIRTHING SUITES;  Service: Gynecology;  Laterality:  N/A;  . VSD REPAIR       OB History    Gravida  8   Para  4   Term  4   Preterm      AB  4   Living  4     SAB  2   IAB  2   Ectopic      Multiple  0   Live Births  4           Family History  Problem Relation Age of Onset  . Bipolar disorder Mother   . Alcohol abuse Mother   . Depression Sister   . Anxiety disorder Sister   . Alcohol abuse Maternal Grandfather   . Diabetes Maternal Grandfather   . Heart attack Maternal Grandfather   . Alcohol abuse Paternal Grandfather   . Heart disease Paternal Grandfather   . Other Father        unsure of history  . Brain cancer Father        Glioblastoma  . Asthma Son   . Other Son        EOE  . Colon cancer Maternal Grandmother     Social History   Tobacco Use  . Smoking status: Current Every Day Smoker    Packs/day: 1.00  . Smokeless tobacco: Never Used  Vaping Use  . Vaping Use: Never  used  Substance Use Topics  . Alcohol use: Not Currently    Comment: occ., 05-07-2017 per pt 2-3 times a mth  . Drug use: No    Home Medications Prior to Admission medications   Medication Sig Start Date End Date Taking? Authorizing Provider  cyclobenzaprine (FLEXERIL) 10 MG tablet Take 1 tablet (10 mg total) by mouth 3 (three) times daily as needed for muscle spasms. 09/22/20  Yes Devoria Albe, MD  naproxen (NAPROSYN) 500 MG tablet Take 1 po BID with food prn pain 09/22/20  Yes Devoria Albe, MD  azithromycin (ZITHROMAX) 250 MG tablet Take 1 tablet (250 mg total) by mouth daily. Take first 2 tablets together, then 1 every day until finished. 09/18/20   Moshe Cipro, NP  cariprazine (VRAYLAR) capsule Take 1 capsule (1.5 mg total) by mouth daily. 08/06/20   Myrlene Broker, MD  clonazePAM (KLONOPIN) 0.5 MG tablet Take 1 tablet (0.5 mg total) by mouth 2 (two) times daily as needed. for anxiety 08/06/20   Myrlene Broker, MD  escitalopram (LEXAPRO) 20 MG tablet Take 1 tablet (20 mg total) by mouth 2 (two) times daily.  08/06/20 08/06/21  Myrlene Broker, MD  lamoTRIgine (LAMICTAL) 25 MG tablet 1 tab bid x one week 2 tab bid x 2nd week 08/20/20   Levert Feinstein, MD  LamoTRIgine 100 MG TB24 24 hour tablet Take 1 tablet (100 mg total) by mouth at bedtime. 08/20/20   Levert Feinstein, MD  meloxicam (MOBIC) 7.5 MG tablet Take 1 tablet (7.5 mg total) by mouth daily as needed for pain. 08/20/20   Levert Feinstein, MD  ondansetron (ZOFRAN ODT) 4 MG disintegrating tablet Take 1 tablet (4 mg total) by mouth every 8 (eight) hours as needed. 08/20/20   Levert Feinstein, MD  SUMAtriptan (IMITREX) 100 MG tablet Take 1 tablet (100 mg total) by mouth once as needed for up to 1 dose for migraine. May repeat in 2 hours if headache persists or recurs. 08/20/20   Levert Feinstein, MD  tiZANidine (ZANAFLEX) 4 MG tablet Take 1 tablet (4 mg total) by mouth every 6 (six) hours as needed for muscle spasms. 08/20/20   Levert Feinstein, MD  topiramate (TOPAMAX) 100 MG tablet Take 3 tablets (300 mg total) by mouth 2 (two) times daily. 05/20/20   Levert Feinstein, MD  zolpidem (AMBIEN CR) 12.5 MG CR tablet Take 1 tablet (12.5 mg total) by mouth at bedtime as needed for sleep. 08/06/20 08/06/21  Myrlene Broker, MD    Allergies    Adhesive [tape], Carbatrol [carbamazepine], Dilantin [phenytoin sodium extended], Tizanidine hcl, and Venlafaxine  Review of Systems   Review of Systems  All other systems reviewed and are negative.   Physical Exam Updated Vital Signs BP (!) 109/58   Pulse (!) 59   Temp 98.1 F (36.7 C) (Oral)   Resp 17   Ht 5\' 4"  (1.626 m)   Wt 75.8 kg   LMP 09/09/2020   SpO2 100%   BMI 28.67 kg/m   Physical Exam Vitals and nursing note reviewed.  Constitutional:      General: She is not in acute distress.    Appearance: Normal appearance. She is obese. She is not ill-appearing or toxic-appearing.     Comments: Appears uncomfortable when she changes positions  HENT:     Head: Normocephalic and atraumatic.     Nose: Nose normal.  Eyes:      Extraocular Movements: Extraocular movements intact.     Conjunctiva/sclera: Conjunctivae normal.  Cardiovascular:     Rate and Rhythm: Normal rate.  Pulmonary:     Effort: Pulmonary effort is normal. No respiratory distress.  Abdominal:       Comments: Area of abdominal pain noted  Musculoskeletal:     Cervical back: Normal range of motion.       Back:     Comments: Patient is tender diffusely in her lower thoracic and her whole lumbar spine.  She is also tender over the paraspinous muscles.  She states that all is painful equally.  There is no pain over the SI joints or over the sciatic notch on either side.  Straight leg raising is positive bilaterally.  Her patellar reflexes are 2+ and equal.  Skin:    General: Skin is warm and dry.  Neurological:     General: No focal deficit present.     Mental Status: She is alert and oriented to person, place, and time.     Cranial Nerves: No cranial nerve deficit.  Psychiatric:        Mood and Affect: Mood normal.        Behavior: Behavior normal.        Thought Content: Thought content normal.     ED Results / Procedures / Treatments   Labs (all labs ordered are listed, but only abnormal results are displayed) Labs Reviewed - No data to display  EKG None  Radiology No results found.  Procedures Procedures (including critical care time)  Medications Ordered in ED Medications  ketorolac (TORADOL) 30 MG/ML injection 30 mg (30 mg Intramuscular Given 09/22/20 0128)  dexamethasone (DECADRON) injection 10 mg (10 mg Intramuscular Given 09/22/20 0127)  diazepam (VALIUM) tablet 5 mg (5 mg Oral Given 09/22/20 0127)    ED Course  I have reviewed the triage vital signs and the nursing notes.  Pertinent labs & imaging results that were available during my care of the patient were reviewed by me and considered in my medical decision making (see chart for details).    MDM Rules/Calculators/A&P                          Patient was  given Toradol, Decadron, and oral Valium for her complaints of back pain.  Patient has a vagal nerve stimulator for her seizure disorder and she came to the ED with her own back massager.  She has a history of back pain.  She has no red flags on her exam tonight.   Final Clinical Impression(s) / ED Diagnoses Final diagnoses:  Acute bilateral low back pain without sciatica  Acute bilateral thoracic back pain  COVID-19 virus infection    Rx / DC Orders ED Discharge Orders         Ordered    naproxen (NAPROSYN) 500 MG tablet        09/22/20 0122    cyclobenzaprine (FLEXERIL) 10 MG tablet  3 times daily PRN        09/22/20 0122         Plan discharge  Devoria Albe, MD, Concha Pyo, MD 09/22/20 Mallie Snooks    Devoria Albe, MD 09/22/20 [phone]

## 2020-11-04 ENCOUNTER — Other Ambulatory Visit: Payer: Self-pay

## 2020-11-04 ENCOUNTER — Encounter (HOSPITAL_COMMUNITY): Payer: Self-pay

## 2020-11-04 ENCOUNTER — Encounter (HOSPITAL_COMMUNITY): Payer: Medicare Other | Admitting: Psychiatry

## 2020-11-05 DIAGNOSIS — M5136 Other intervertebral disc degeneration, lumbar region: Secondary | ICD-10-CM | POA: Diagnosis not present

## 2020-11-06 ENCOUNTER — Other Ambulatory Visit: Payer: Self-pay

## 2020-11-06 ENCOUNTER — Encounter (HOSPITAL_COMMUNITY): Payer: Self-pay | Admitting: Psychiatry

## 2020-11-06 ENCOUNTER — Telehealth (INDEPENDENT_AMBULATORY_CARE_PROVIDER_SITE_OTHER): Payer: Medicare Other | Admitting: Psychiatry

## 2020-11-06 DIAGNOSIS — F331 Major depressive disorder, recurrent, moderate: Secondary | ICD-10-CM | POA: Diagnosis not present

## 2020-11-06 MED ORDER — ZOLPIDEM TARTRATE ER 12.5 MG PO TBCR: 12.5000 mg | EXTENDED_RELEASE_TABLET | Freq: Every evening | ORAL | 2 refills | Status: DC | PRN

## 2020-11-06 MED ORDER — CARIPRAZINE HCL 1.5 MG PO CAPS: 1.5000 mg | ORAL_CAPSULE | Freq: Every day | ORAL | 2 refills | Status: DC

## 2020-11-06 MED ORDER — CLONAZEPAM 0.5 MG PO TABS: 0.5000 mg | ORAL_TABLET | Freq: Two times a day (BID) | ORAL | 2 refills | Status: DC | PRN

## 2020-11-06 MED ORDER — ESCITALOPRAM OXALATE 20 MG PO TABS: 20.0000 mg | ORAL_TABLET | Freq: Two times a day (BID) | ORAL | 2 refills | Status: DC

## 2020-11-06 NOTE — Progress Notes (Signed)
Virtual Visit via Video Note  I connected with Loretta Padilla on 11/06/20 at 11:00 AM EST by a video enabled telemedicine application and verified that I am speaking with the correct person using two identifiers.  Location: Patient: home Provider:home   I discussed the limitations of evaluation and management by telemedicine and the availability of in person appointments. The patient expressed understanding and agreed to proceed.    I discussed the assessment and treatment plan with the patient. The patient was provided an opportunity to ask questions and all were answered. The patient agreed with the plan and demonstrated an understanding of the instructions.   The patient was advised to call back or seek an in-person evaluation if the symptoms worsen or if the condition fails to improve as anticipated.  I provided 15 minutes of non-face-to-face time during this encounter.   Diannia Ruder, MD  [facility] MD/PA/NP OP Progress Note  11/06/2020 11:16 AM Loretta Padilla  MRN:  [id number]  Chief Complaint:  Chief Complaint    Anxiety; Depression; Follow-up     HPI: This patient is a 35 year old married white female who lives with her children and husband in [place].  She is on disability for seizure disorder.  The patient returns for follow-up after 3 months.  She states that she spent the whole month of January in [place].  While she was there her aunt and grandmother both died from coronavirus related pneumonia.  She has had a hard time with this as had her mother.  She is somewhat more depressed.  She is trying to "keep it together" in order to help her children.  She is also had several things break at her house such as a water heater heating and air unit etc.  She admits that she is rather stressed.  She had to stop Lamictal prescribed by her neurologist because of her rash.  She had one seizure in [place] but has not had further seizures.  She thinks a lot of her stress right now  situational dosing for the most part her medications are working well.  Most the time she is sleeping well denies serious depression anxiety or thoughts of self-harm. Visit Diagnosis:    ICD-10-CM   1. Moderate episode of recurrent major depressive disorder (HCC)  F33.1     Past Psychiatric History: Long-term outpatient treatment for depression  Past Medical History:  Past Medical History:  Diagnosis Date  . Anxiety   . Back pain   . Depression   . Kidney stones   . Seizures (HCC)     Past Surgical History:  Procedure Laterality Date  . IMPLANTATION VAGAL NERVE STIMULATOR    . TUBAL LIGATION N/A 10/10/2018   Procedure: POST PARTUM TUBAL LIGATION;  Surgeon: Hermina Staggers, MD;  Location: [facility] BIRTHING SUITES;  Service: Gynecology;  Laterality: N/A;  . VSD REPAIR      Family Psychiatric History: see below  Family History:  Family History  Problem Relation Age of Onset  . Bipolar disorder Mother   . Alcohol abuse Mother   . Depression Sister   . Anxiety disorder Sister   . Alcohol abuse Maternal Grandfather   . Diabetes Maternal Grandfather   . Heart attack Maternal Grandfather   . Alcohol abuse Paternal Grandfather   . Heart disease Paternal Grandfather   . Other Father        unsure of history  . Brain cancer Father        Glioblastoma  . Asthma Son   .  Other Son        EOE  . Colon cancer Maternal Grandmother     Social History:  Social History   Socioeconomic History  . Marital status: Divorced    Spouse name: Not on file  . Number of children: 3  . Years of education: 3 years college  . Highest education level: Not on file  Occupational History  . Occupation: Disabled  Tobacco Use  . Smoking status: Current Every Day Smoker    Packs/day: 1.00  . Smokeless tobacco: Never Used  Vaping Use  . Vaping Use: Never used  Substance and Sexual Activity  . Alcohol use: Not Currently    Comment: occ., 05-07-2017 per pt 2-3 times a mth  . Drug use: No  . Sexual  activity: Yes    Birth control/protection: Surgical    Comment: tubal  Other Topics Concern  . Not on file  Social History Narrative   Lives at home with boyfriend and children.   Right-handed.   Occasional use of caffeine.      Social Determinants of Health   Financial Resource Strain: Not on file  Food Insecurity: Not on file  Transportation Needs: Not on file  Physical Activity: Not on file  Stress: Not on file  Social Connections: Not on file    Allergies:  Allergies  Allergen Reactions  . Adhesive [Tape]     Birth control patch and nicoderm patch   . Carbatrol [Carbamazepine] Rash  . Dilantin [Phenytoin Sodium Extended] Rash  . Tizanidine Hcl Rash  . Venlafaxine Rash    Metabolic Disorder Labs: No results found for: HGBA1C, MPG No results found for: PROLACTIN No results found for: CHOL, TRIG, HDL, CHOLHDL, VLDL, LDLCALC Lab Results  Component Value Date   TSH 2.940 05/20/2020   TSH 2.370 03/18/2020    Therapeutic Level Labs: No results found for: LITHIUM No results found for: VALPROATE No components found for:  CBMZ  Current Medications: Current Outpatient Medications  Medication Sig Dispense Refill  . azithromycin (ZITHROMAX) 250 MG tablet Take 1 tablet (250 mg total) by mouth daily. Take first 2 tablets together, then 1 every day until finished. 6 tablet 0  . cariprazine (VRAYLAR) capsule Take 1 capsule (1.5 mg total) by mouth daily. 30 capsule 2  . clonazePAM (KLONOPIN) 0.5 MG tablet Take 1 tablet (0.5 mg total) by mouth 2 (two) times daily as needed. for anxiety 60 tablet 2  . cyclobenzaprine (FLEXERIL) 10 MG tablet Take 1 tablet (10 mg total) by mouth 3 (three) times daily as needed for muscle spasms. 30 tablet 0  . escitalopram (LEXAPRO) 20 MG tablet Take 1 tablet (20 mg total) by mouth 2 (two) times daily. 60 tablet 2  . meloxicam (MOBIC) 7.5 MG tablet Take 1 tablet (7.5 mg total) by mouth daily as needed for pain. 30 tablet 6  . naproxen (NAPROSYN)  500 MG tablet Take 1 po BID with food prn pain 30 tablet 0  . ondansetron (ZOFRAN ODT) 4 MG disintegrating tablet Take 1 tablet (4 mg total) by mouth every 8 (eight) hours as needed. 20 tablet 6  . SUMAtriptan (IMITREX) 100 MG tablet Take 1 tablet (100 mg total) by mouth once as needed for up to 1 dose for migraine. May repeat in 2 hours if headache persists or recurs. 12 tablet 6  . tiZANidine (ZANAFLEX) 4 MG tablet Take 1 tablet (4 mg total) by mouth every 6 (six) hours as needed for muscle spasms. 30 tablet 6  .  topiramate (TOPAMAX) 100 MG tablet Take 3 tablets (300 mg total) by mouth 2 (two) times daily. 540 tablet 4  . zolpidem (AMBIEN CR) 12.5 MG CR tablet Take 1 tablet (12.5 mg total) by mouth at bedtime as needed for sleep. 30 tablet 2   No current facility-administered medications for this visit.     Musculoskeletal: Strength & Muscle Tone: within normal limits Gait & Station: normal Patient leans: N/A  Psychiatric Specialty Exam: Review of Systems  Neurological: Positive for seizures.  Psychiatric/Behavioral: The patient is nervous/anxious.   All other systems reviewed and are negative.   There were no vitals taken for this visit.There is no height or weight on file to calculate BMI.  General Appearance: Casual and Fairly Groomed  Eye Contact:  Good  Speech:  Clear and Coherent  Volume:  Normal  Mood:  Anxious and Euthymic  Affect:  Appropriate and Congruent  Thought Process:  Goal Directed  Orientation:  Full (Time, Place, and Person)  Thought Content: Rumination   Suicidal Thoughts:  No  Homicidal Thoughts:  No  Memory:  Immediate;   Good Recent;   Good Remote;   Good  Judgement:  Good  Insight:  Good  Psychomotor Activity:  Normal  Concentration:  Concentration: Good and Attention Span: Good  Recall:  Good  Fund of Knowledge: Good  Language: Good  Akathisia:  No  Handed:  Right  AIMS (if indicated): not done  Assets:  Communication Skills Desire for  Improvement Physical Health Resilience Social Support Talents/Skills  ADL's:  Intact  Cognition: WNL  Sleep:  Good   Screenings: PHQ2-9   Flowsheet Row Video [facility] Initial Prenatal from 03/15/2018 in [facility]  PHQ-2 Total Score 2 1  PHQ-9 Total Score 12 9       Assessment and Plan: This patient is a 35 year old female with a history of seizure disorder depression mood swings and anxiety.  For the most part she has been stable although she is under more stress.  She will continue Lexapro 20 mg twice daily for depression, Vraylar 1.5 mg daily for mood swings, clonazepam 0.5 mg twice daily as needed for anxiety and Ambien CR 12.5 mg at bedtime for sleep.  She will return to see me in 3 months   Diannia Ruder, MD 11/06/2020, 11:16 AM

## 2020-11-18 ENCOUNTER — Encounter: Payer: Self-pay | Admitting: Neurology

## 2020-11-18 ENCOUNTER — Ambulatory Visit: Payer: Medicare Other | Admitting: Neurology

## 2020-11-18 ENCOUNTER — Telehealth: Payer: Self-pay | Admitting: *Deleted

## 2020-11-18 NOTE — Telephone Encounter (Signed)
No showed follow up appointment. 

## 2020-12-25 ENCOUNTER — Other Ambulatory Visit: Payer: Self-pay

## 2020-12-25 DIAGNOSIS — F17200 Nicotine dependence, unspecified, uncomplicated: Secondary | ICD-10-CM | POA: Insufficient documentation

## 2020-12-25 DIAGNOSIS — R202 Paresthesia of skin: Secondary | ICD-10-CM | POA: Insufficient documentation

## 2020-12-25 DIAGNOSIS — M545 Low back pain, unspecified: Secondary | ICD-10-CM | POA: Insufficient documentation

## 2020-12-25 DIAGNOSIS — M5459 Other low back pain: Secondary | ICD-10-CM | POA: Diagnosis not present

## 2020-12-26 ENCOUNTER — Emergency Department (HOSPITAL_COMMUNITY)
Admission: EM | Admit: 2020-12-26 | Discharge: 2020-12-26 | Disposition: A | Discharge status: Home / Self Care | Payer: Medicare Other | Attending: Emergency Medicine | Admitting: Emergency Medicine

## 2020-12-26 DIAGNOSIS — M545 Low back pain, unspecified: Secondary | ICD-10-CM

## 2020-12-26 LAB — URINALYSIS, ROUTINE W REFLEX MICROSCOPIC
Glucose, UA: NEGATIVE mg/dL
Hgb urine dipstick: NEGATIVE
Ketones, ur: NEGATIVE mg/dL
Leukocytes,Ua: NEGATIVE
Nitrite: NEGATIVE
Protein, ur: NEGATIVE mg/dL
Specific Gravity, Urine: 1.029 (ref 1.005–1.030)
pH: 5 (ref 5.0–8.0)

## 2020-12-26 LAB — PREGNANCY, URINE: Preg Test, Ur: NEGATIVE

## 2020-12-26 MED ORDER — KETOROLAC TROMETHAMINE 30 MG/ML IJ SOLN
30.0000 mg | Freq: Once | INTRAMUSCULAR | Status: AC
  Administered 2020-12-26: 30 mg via INTRAMUSCULAR
  Filled 2020-12-26: qty 1

## 2020-12-26 MED ORDER — OXYCODONE-ACETAMINOPHEN 5-325 MG PO TABS
1.0000 | ORAL_TABLET | Freq: Once | ORAL | Status: AC
  Administered 2020-12-26: 1 via ORAL
  Filled 2020-12-26: qty 1

## 2020-12-26 NOTE — ED Triage Notes (Signed)
Pt here for chronic back pain. States she hasn't has a steroid shot in her back in a while and it is really bothering her. Used to see Dr Shon Baton but hasn't been. 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

## 2020-12-26 NOTE — Discharge Instructions (Signed)

## 2020-12-26 NOTE — ED Provider Notes (Signed)
Emergency Department Provider Note   I have reviewed the triage vital signs and the nursing notes.   HISTORY  Chief Complaint Back Pain (Chronic )   HPI Loretta Padilla is a 35 y.o. female with PMH reviewed below including epilepsy and chronic back pain presents to the ED with return of "grinding" lower back pain and cramping in the lower back. No abdominal or chest pain. No numbness in the legs or groin. No urinary retention/incontinence. Similarly, no stool incontinence. Notes some occasional tingling in the fingers/toes. Has seen Dr. Shon Baton in the past by patient report with spine injections but nothing recent. Pain is moderate and worse with movement. Patient is ambulatory. Denies fever. No IVDA.    Past Medical History:  Diagnosis Date  . Anxiety   . Back pain   . Depression   . Kidney stones   . Seizures [facility])     Patient Active Problem List   Diagnosis Date Noted  . Seizures (HCC) 05/20/2020  . Chronic migraine without aura without status migrainosus, not intractable 05/20/2020  . Polypharmacy 05/20/2020  . Body mass index 27.0-27.9, adult 04/03/2020  . Weight loss counseling, encounter for 04/03/2020  . Abnormal uterine bleeding (AUB) 03/18/2020  . Lactation symptom 03/18/2020  . History of bilateral tubal ligation 11/15/2018  . Insomnia 03/15/2018  . Epilepsy (HCC) 11/23/2017  . Smoker 08/03/2017  . Depression 05/07/2017    Past Surgical History:  Procedure Laterality Date  . IMPLANTATION VAGAL NERVE STIMULATOR    . TUBAL LIGATION N/A 10/10/2018   Procedure: POST PARTUM TUBAL LIGATION;  Surgeon: Hermina Staggers, MD;  Location: [facility] BIRTHING SUITES;  Service: Gynecology;  Laterality: N/A;  . VSD REPAIR      Allergies Adhesive [tape], Carbatrol [carbamazepine], Dilantin [phenytoin sodium extended], Tizanidine hcl, and Venlafaxine  Family History  Problem Relation Age of Onset  . Bipolar disorder Mother   . Alcohol abuse Mother   . Depression Sister    . Anxiety disorder Sister   . Alcohol abuse Maternal Grandfather   . Diabetes Maternal Grandfather   . Heart attack Maternal Grandfather   . Alcohol abuse Paternal Grandfather   . Heart disease Paternal Grandfather   . Other Father        unsure of history  . Brain cancer Father        Glioblastoma  . Asthma Son   . Other Son        EOE  . Colon cancer Maternal Grandmother     Social History Social History   Tobacco Use  . Smoking status: Current Every Day Smoker    Packs/day: 1.00  . Smokeless tobacco: Never Used  Vaping Use  . Vaping Use: Never used  Substance Use Topics  . Alcohol use: Not Currently    Comment: occ., 05-07-2017 per pt 2-3 times a mth  . Drug use: No    Review of Systems  Constitutional: No fever/chills Eyes: No visual changes. ENT: No sore throat. Cardiovascular: Denies chest pain. Respiratory: Denies shortness of breath. Gastrointestinal: No abdominal pain.  No nausea, no vomiting.  No diarrhea.  No constipation. Genitourinary: Negative for dysuria. Musculoskeletal: Positive for back pain. Skin: Negative for rash. Neurological: Negative for headaches, focal weakness or numbness.  10-point ROS otherwise negative.  ____________________________________________   PHYSICAL EXAM:  VITAL SIGNS: ED Triage Vitals  Enc Vitals Group     BP 12/26/20 0006 (!) 120/105     Pulse Rate 12/26/20 0008 88     Resp  12/26/20 0008 19     Temp 12/26/20 0008 98 F (36.7 C)     Temp src --      SpO2 12/26/20 0008 99 %     Weight 12/26/20 0010 178 lb (80.7 kg)     Height 12/26/20 0010 5\' 4"  (1.626 m)   Constitutional: Alert and oriented. Well appearing and in no acute distress. Eyes: Conjunctivae are normal.  Head: Atraumatic. Nose: No congestion/rhinnorhea. Mouth/Throat: Mucous membranes are moist.  Neck: No stridor.  Cardiovascular: Normal rate, regular rhythm. Good peripheral circulation. Grossly normal heart sounds.   Respiratory: Normal  respiratory effort.  No retractions. Lungs CTAB. Gastrointestinal: Soft and nontender. Mild distention.  Musculoskeletal: No lower extremity tenderness nor edema. No gross deformities of extremities. Neurologic:  Normal speech and language. No gross focal neurologic deficits are appreciated. Normal strength and sensation in the bilateral upper and lower extremities. 2+ patellar reflexes bilaterally.  Skin:  Skin is warm, dry and intact. No rash noted.   ____________________________________________   LABS (all labs ordered are listed, but only abnormal results are displayed)  Labs Reviewed  URINALYSIS, ROUTINE W REFLEX MICROSCOPIC - Abnormal; Notable for the following components:      Result Value   Bilirubin Urine MODERATE (*)    All other components within normal limits  PREGNANCY, URINE    ____________________________________________   PROCEDURES  Procedure(s) performed:   Procedures  None  ____________________________________________   INITIAL IMPRESSION / ASSESSMENT AND PLAN / ED COURSE  Pertinent labs & imaging results that were available during my care of the patient were reviewed by me and considered in my medical decision making (see chart for details).   Patient presents to the ED with acute on chronic back pain. No red flag signs/symptoms to prompt emergent imaging here. Will send UA and pregnancy.   Differential diagnosis includes but is not exclusive to musculoskeletal back pain, renal colic, urinary tract infection, pyelonephritis, intra-abdominal causes of back pain, aortic aneurysm or dissection, cauda equina syndrome, sciatica, lumbar disc disease, thoracic disc disease, etc.  Patient has a sober ride home. Will give a Percocet in the ED and follow UA.   UA is not consistent with infection.  No hemoglobin or RBCs to suspect ureteral stone.  Pregnancy test is negative.  Plan for Toradol and close outpatient follow-up with her spine team.  She has pain  medications and Flexeril at home which she will continue to use.  Discussed ED return precautions and provided in writing as well.  ____________________________________________  FINAL CLINICAL IMPRESSION(S) / ED DIAGNOSES  Final diagnoses:  Acute bilateral low back pain without sciatica    MEDICATIONS GIVEN DURING THIS VISIT:  Medications  ketorolac (TORADOL) 30 MG/ML injection 30 mg (has no administration in time range)  oxyCODONE-acetaminophen (PERCOCET/ROXICET) 5-325 MG per tablet 1 tablet (1 tablet Oral Given 12/26/20 0103)    Note:  This document was prepared using Dragon voice recognition software and may include unintentional dictation errors.  12/28/20, MD, [facility]    Vicie Cech, [facility], MD 12/26/20 [phone]

## 2020-12-31 DIAGNOSIS — Z79899 Other long term (current) drug therapy: Secondary | ICD-10-CM | POA: Diagnosis not present

## 2020-12-31 DIAGNOSIS — Z720 Tobacco use: Secondary | ICD-10-CM | POA: Diagnosis not present

## 2020-12-31 DIAGNOSIS — E7800 Pure hypercholesterolemia, unspecified: Secondary | ICD-10-CM | POA: Diagnosis not present

## 2020-12-31 DIAGNOSIS — Z0000 Encounter for general adult medical examination without abnormal findings: Secondary | ICD-10-CM | POA: Diagnosis not present

## 2020-12-31 DIAGNOSIS — G43809 Other migraine, not intractable, without status migrainosus: Secondary | ICD-10-CM | POA: Diagnosis not present

## 2021-01-09 DIAGNOSIS — M5412 Radiculopathy, cervical region: Secondary | ICD-10-CM | POA: Diagnosis not present

## 2021-01-09 DIAGNOSIS — M5459 Other low back pain: Secondary | ICD-10-CM | POA: Diagnosis not present

## 2021-01-09 DIAGNOSIS — M5030 Other cervical disc degeneration, unspecified cervical region: Secondary | ICD-10-CM | POA: Diagnosis not present

## 2021-01-10 ENCOUNTER — Other Ambulatory Visit: Payer: Self-pay

## 2021-01-10 ENCOUNTER — Encounter: Payer: Self-pay | Admitting: Adult Health

## 2021-01-10 ENCOUNTER — Ambulatory Visit (INDEPENDENT_AMBULATORY_CARE_PROVIDER_SITE_OTHER): Payer: Medicare Other | Admitting: Adult Health

## 2021-01-10 VITALS — BP 107/71 | HR 86 | Ht 64.0 in | Wt 181.5 lb

## 2021-01-10 DIAGNOSIS — Z3202 Encounter for pregnancy test, result negative: Secondary | ICD-10-CM | POA: Diagnosis not present

## 2021-01-10 DIAGNOSIS — N926 Irregular menstruation, unspecified: Secondary | ICD-10-CM | POA: Diagnosis not present

## 2021-01-10 DIAGNOSIS — R635 Abnormal weight gain: Secondary | ICD-10-CM | POA: Diagnosis not present

## 2021-01-10 LAB — POCT URINE PREGNANCY: Preg Test, Ur: NEGATIVE

## 2021-01-10 NOTE — Progress Notes (Signed)
  Subjective:     Patient ID: Loretta Padilla, female   DOB: 1985-11-26, 35 y.o.   MRN: [id number]  HPI Loretta Padilla is a 35 year old white female, divorced, [id number] in complaining of period being 2 weeks late and weight gain, She sees back surgeon 01/31/21. PCP is Dr Chanetta Marshall.  Review of Systems Periods late +weight gain Reviewed past medical,surgical, social and family history. Reviewed medications and allergies.     Objective:   Physical Exam BP 107/71 (BP Location: Left Arm, Patient Position: Sitting, Cuff Size: Normal)   Pulse 86   Ht 5\' 4"  (1.626 m)   Wt 181 lb 8 oz (82.3 kg)   LMP 12/11/2020   BMI 31.15 kg/m  UPT is negative. Skin warm and dry. Lungs: clear to ausculation bilaterally. Cardiovascular: regular rate and rhythm. Has mild swelling ankles, can see where socks fit    Fall risk is low  Upstream - 01/10/21 1113      Pregnancy Intention Screening   Does the patient want to become pregnant in the next year? No    Does the patient's partner want to become pregnant in the next year? No    Would the patient like to discuss contraceptive options today? No      Contraception Wrap Up   Current Method Female Sterilization    End Method Female Sterilization    Contraception Counseling Provided No          Assessment:     1. Pregnancy examination or test, negative result   2. Menstrual period late Will follow for now  3. Weight gain Stop drinking juice try water with lemon or cucumber or fresh fruit Decrease calories to 1200 and continue to exercise     Plan:     Follow up in 4 weeks for pap, had physical with PCP

## 2021-01-25 ENCOUNTER — Other Ambulatory Visit: Payer: Self-pay

## 2021-01-25 ENCOUNTER — Encounter (HOSPITAL_COMMUNITY): Payer: Self-pay | Admitting: Emergency Medicine

## 2021-01-25 ENCOUNTER — Emergency Department (HOSPITAL_COMMUNITY)
Admission: EM | Admit: 2021-01-25 | Discharge: 2021-01-25 | Disposition: A | Discharge status: Home / Self Care | Payer: Medicare Other | Attending: Emergency Medicine | Admitting: Emergency Medicine

## 2021-01-25 DIAGNOSIS — G8929 Other chronic pain: Secondary | ICD-10-CM | POA: Insufficient documentation

## 2021-01-25 DIAGNOSIS — F17210 Nicotine dependence, cigarettes, uncomplicated: Secondary | ICD-10-CM | POA: Insufficient documentation

## 2021-01-25 DIAGNOSIS — M5441 Lumbago with sciatica, right side: Secondary | ICD-10-CM | POA: Diagnosis not present

## 2021-01-25 DIAGNOSIS — M545 Low back pain, unspecified: Secondary | ICD-10-CM | POA: Insufficient documentation

## 2021-01-25 DIAGNOSIS — M5440 Lumbago with sciatica, unspecified side: Secondary | ICD-10-CM

## 2021-01-25 MED ORDER — PREDNISONE 10 MG PO TABS
20.0000 mg | ORAL_TABLET | Freq: Every day | ORAL | 0 refills | Status: DC
Start: 2021-01-25 — End: 2021-01-25

## 2021-01-25 MED ORDER — OXYCODONE-ACETAMINOPHEN 5-325 MG PO TABS
1.0000 | ORAL_TABLET | Freq: Once | ORAL | Status: AC
  Administered 2021-01-25: 1 via ORAL
  Filled 2021-01-25: qty 1

## 2021-01-25 MED ORDER — OXYCODONE-ACETAMINOPHEN 5-325 MG PO TABS: 1.0000 | ORAL_TABLET | Freq: Four times a day (QID) | ORAL | 0 refills | Status: AC | PRN

## 2021-01-25 MED ORDER — PREDNISONE 10 MG PO TABS: 20.0000 mg | ORAL_TABLET | Freq: Every day | ORAL | 0 refills | Status: AC

## 2021-01-25 MED ORDER — OXYCODONE-ACETAMINOPHEN 5-325 MG PO TABS
1.0000 | ORAL_TABLET | Freq: Four times a day (QID) | ORAL | Status: DC | PRN
Start: 2021-01-25 — End: 2021-01-26

## 2021-01-25 NOTE — ED Provider Notes (Signed)
[facility] EMERGENCY DEPARTMENT Provider Note   CSN: [id number] Arrival date & time: 01/25/21  2006     History Chief Complaint  Patient presents with  . Back Pain    Loretta Padilla is a 35 y.o. female.  Patient has a history of severe degenerative back disease.  Her lower back seems to be bothering her recently.  She has been taking 3 nonsteroidal anti-inflammatory agents..  She was taking Motrin and Naprosyn and meloxicam.  She states the pain seems to be getting worse.  The history is provided by the patient and medical records. No language interpreter was used.  Back Pain Location:  Lumbar spine Quality:  Aching Radiates to:  Does not radiate Pain severity:  Moderate Pain is:  Worse during the day Onset quality:  Gradual Timing:  Constant Progression:  Worsening Chronicity:  Recurrent Context: not emotional stress   Associated symptoms: no abdominal pain, no chest pain and no headaches        Past Medical History:  Diagnosis Date  . Anxiety   . Back pain   . Depression   . Kidney stones   . Seizures [facility])     Patient Active Problem List   Diagnosis Date Noted  . Weight gain 01/10/2021  . Menstrual period late 01/10/2021  . Pregnancy examination or test, negative result 01/10/2021  . Seizures (HCC) 05/20/2020  . Chronic migraine without aura without status migrainosus, not intractable 05/20/2020  . Polypharmacy 05/20/2020  . Body mass index 27.0-27.9, adult 04/03/2020  . Weight loss counseling, encounter for 04/03/2020  . Abnormal uterine bleeding (AUB) 03/18/2020  . Lactation symptom 03/18/2020  . History of bilateral tubal ligation 11/15/2018  . Insomnia 03/15/2018  . Epilepsy (HCC) 11/23/2017  . Smoker 08/03/2017  . Depression 05/07/2017    Past Surgical History:  Procedure Laterality Date  . IMPLANTATION VAGAL NERVE STIMULATOR    . TUBAL LIGATION N/A 10/10/2018   Procedure: POST PARTUM TUBAL LIGATION;  Surgeon: Hermina Staggers, MD;   Location: [facility] BIRTHING SUITES;  Service: Gynecology;  Laterality: N/A;  . VSD REPAIR       OB History    Gravida  8   Para  4   Term  4   Preterm      AB  4   Living  4     SAB  2   IAB  2   Ectopic      Multiple  0   Live Births  4           Family History  Problem Relation Age of Onset  . Bipolar disorder Mother   . Alcohol abuse Mother   . Depression Sister   . Anxiety disorder Sister   . Alcohol abuse Maternal Grandfather   . Diabetes Maternal Grandfather   . Heart attack Maternal Grandfather   . Alcohol abuse Paternal Grandfather   . Heart disease Paternal Grandfather   . Other Father        unsure of history  . Brain cancer Father        Glioblastoma  . Asthma Son   . Other Son        EOE  . Colon cancer Maternal Grandmother   . Pneumonia Maternal Grandmother   . Other Maternal Grandmother        covid    Social History   Tobacco Use  . Smoking status: Current Every Day Smoker    Packs/day: 0.50    Types: Cigarettes  .  Smokeless tobacco: Never Used  Vaping Use  . Vaping Use: Never used  Substance Use Topics  . Alcohol use: Not Currently    Comment: occ., 05-07-2017 per pt 2-3 times a mth  . Drug use: No    Home Medications Prior to Admission medications   Medication Sig Start Date End Date Taking? Authorizing Provider  oxyCODONE-acetaminophen (PERCOCET) 5-325 MG tablet Take 1 tablet by mouth every 6 (six) hours as needed. 01/25/21  Yes Bethann Berkshire, MD  acetaminophen (TYLENOL) 500 MG tablet 1 tablet as needed    [provider]  cariprazine (VRAYLAR) capsule Take 1 capsule (1.5 mg total) by mouth daily. 11/06/20   Myrlene Broker, MD  clonazePAM (KLONOPIN) 0.5 MG tablet Take 1 tablet (0.5 mg total) by mouth 2 (two) times daily as needed. for anxiety 11/06/20   Myrlene Broker, MD  cyclobenzaprine (FLEXERIL) 10 MG tablet Take 1 tablet (10 mg total) by mouth 3 (three) times daily as needed for muscle spasms. 09/22/20   Devoria Albe,  MD  escitalopram (LEXAPRO) 20 MG tablet Take 1 tablet (20 mg total) by mouth 2 (two) times daily. 11/06/20 11/06/21  Myrlene Broker, MD  FLUoxetine HCl 60 MG TABS 1 tablet    [provider]  ibuprofen (ADVIL) 800 MG tablet 1 tablet with food or milk    [provider]  meloxicam (MOBIC) 7.5 MG tablet Take 1 tablet (7.5 mg total) by mouth daily as needed for pain. 08/20/20   Levert Feinstein, MD  naproxen (NAPROSYN) 500 MG tablet Take 1 po BID with food prn pain 09/22/20   Devoria Albe, MD  ondansetron (ZOFRAN ODT) 4 MG disintegrating tablet Take 1 tablet (4 mg total) by mouth every 8 (eight) hours as needed. 08/20/20   Levert Feinstein, MD  predniSONE (DELTASONE) 10 MG tablet Take 2 tablets (20 mg total) by mouth daily. 01/25/21   Bethann Berkshire, MD  SUMAtriptan (IMITREX) 100 MG tablet Take 1 tablet (100 mg total) by mouth once as needed for up to 1 dose for migraine. May repeat in 2 hours if headache persists or recurs. 08/20/20   Levert Feinstein, MD  topiramate (TOPAMAX) 100 MG tablet Take 3 tablets (300 mg total) by mouth 2 (two) times daily. 05/20/20   Levert Feinstein, MD  varenicline (CHANTIX) 0.5 MG tablet 1 tablet after eating with a full glass of water 12/31/20   [provider]  zolpidem (AMBIEN CR) 12.5 MG CR tablet Take 1 tablet (12.5 mg total) by mouth at bedtime as needed for sleep. 11/06/20 11/06/21  Myrlene Broker, MD    Allergies    Adhesive [tape], Carbatrol [carbamazepine], Dilantin [phenytoin sodium extended], Phenobarbital, Tizanidine hcl, and Venlafaxine  Review of Systems   Review of Systems  Constitutional: Negative for appetite change and fatigue.  HENT: Negative for congestion, ear discharge and sinus pressure.   Eyes: Negative for discharge.  Respiratory: Negative for cough.   Cardiovascular: Negative for chest pain.  Gastrointestinal: Negative for abdominal pain and diarrhea.  Genitourinary: Negative for frequency and hematuria.  Musculoskeletal: Positive for back pain.   Skin: Negative for rash.  Neurological: Negative for seizures and headaches.  Psychiatric/Behavioral: Negative for hallucinations.    Physical Exam Updated Vital Signs BP 108/79 (BP Location: Right Arm)   Pulse 74   Temp 98.3 F (36.8 C) (Oral)   Resp 18   Ht 5\' 4"  (1.626 m)   Wt 82.6 kg   SpO2 100%   BMI 31.24 kg/m  Physical Exam Vitals and nursing note reviewed.  Constitutional:      Appearance: She is well-developed.  HENT:     Head: Normocephalic.     Nose: Nose normal.  Eyes:     General: No scleral icterus.    Conjunctiva/sclera: Conjunctivae normal.  Neck:     Thyroid: No thyromegaly.  Cardiovascular:     Rate and Rhythm: Normal rate and regular rhythm.     Heart sounds: No murmur heard. No friction rub. No gallop.   Pulmonary:     Breath sounds: No stridor. No wheezing or rales.  Chest:     Chest wall: No tenderness.  Abdominal:     General: There is no distension.     Tenderness: There is no abdominal tenderness. There is no rebound.  Musculoskeletal:     Cervical back: Neck supple.     Comments: Tenderness lumbar spine  Lymphadenopathy:     Cervical: No cervical adenopathy.  Skin:    Findings: No erythema or rash.  Neurological:     Mental Status: She is oriented to person, place, and time.     Motor: No abnormal muscle tone.     Coordination: Coordination normal.  Psychiatric:        Behavior: Behavior normal.     ED Results / Procedures / Treatments   Labs (all labs ordered are listed, but only abnormal results are displayed) Labs Reviewed - No data to display  EKG None  Radiology No results found.  Procedures Procedures   Medications Ordered in ED Medications  oxyCODONE-acetaminophen (PERCOCET/ROXICET) 5-325 MG per tablet 1 tablet (has no administration in time range)  oxyCODONE-acetaminophen (PERCOCET/ROXICET) 5-325 MG per tablet 1 tablet (has no administration in time range)    ED Course  I have reviewed the triage vital  signs and the nursing notes.  Pertinent labs & imaging results that were available during my care of the patient were reviewed by me and considered in my medical decision making (see chart for details).    MDM Rules/Calculators/A&P                         Patient with exacerbation of her chronic back pain.  She has degenerative changes in her lower back.  She was told to take only 1 nonsteroidal and she has been put on prednisone and Percocet and will follow up with her orthopedic doctor next week Final Clinical Impression(s) / ED Diagnoses Final diagnoses:  Acute right-sided low back pain with sciatica, sciatica laterality unspecified    Rx / DC Orders ED Discharge Orders         Ordered    predniSONE (DELTASONE) 10 MG tablet  Daily,   Status:  Discontinued        01/25/21 2215    predniSONE (DELTASONE) 10 MG tablet  Daily        01/25/21 2217    oxyCODONE-acetaminophen (PERCOCET) 5-325 MG tablet  Every 6 hours PRN        01/25/21 2217           Bethann Berkshire, MD 01/26/21 1111

## 2021-01-25 NOTE — ED Notes (Signed)
Pt ambulated to bathroom without difficulty. Gait slow, but steady. Pt appears to be in NAD.

## 2021-01-25 NOTE — Discharge Instructions (Addendum)
Follow-up with your doctor as planned later this week

## 2021-01-25 NOTE — ED Triage Notes (Signed)
Pt c/o chronic back pain. Pt states she normally has to get a prescription for pain medication and steroids.

## 2021-01-31 DIAGNOSIS — M47812 Spondylosis without myelopathy or radiculopathy, cervical region: Secondary | ICD-10-CM | POA: Diagnosis not present

## 2021-01-31 DIAGNOSIS — M5136 Other intervertebral disc degeneration, lumbar region: Secondary | ICD-10-CM | POA: Diagnosis not present

## 2021-02-05 ENCOUNTER — Telehealth (INDEPENDENT_AMBULATORY_CARE_PROVIDER_SITE_OTHER): Payer: Medicare Other | Admitting: Psychiatry

## 2021-02-05 ENCOUNTER — Other Ambulatory Visit: Payer: Self-pay

## 2021-02-05 ENCOUNTER — Encounter (HOSPITAL_COMMUNITY): Payer: Self-pay | Admitting: Psychiatry

## 2021-02-05 ENCOUNTER — Ambulatory Visit (HOSPITAL_COMMUNITY): Payer: Medicare Other | Admitting: Psychiatry

## 2021-02-05 DIAGNOSIS — F331 Major depressive disorder, recurrent, moderate: Secondary | ICD-10-CM | POA: Diagnosis not present

## 2021-02-05 MED ORDER — CARIPRAZINE HCL 1.5 MG PO CAPS: 1.5000 mg | ORAL_CAPSULE | Freq: Every day | ORAL | 2 refills | Status: AC

## 2021-02-05 MED ORDER — ZOLPIDEM TARTRATE ER 12.5 MG PO TBCR
12.5000 mg | EXTENDED_RELEASE_TABLET | Freq: Every evening | ORAL | 2 refills | Status: DC | PRN
Start: 2021-02-05 — End: 2021-06-21

## 2021-02-05 MED ORDER — CLONAZEPAM 0.5 MG PO TABS: 0.5000 mg | ORAL_TABLET | Freq: Two times a day (BID) | ORAL | 2 refills | Status: AC | PRN

## 2021-02-05 MED ORDER — ESCITALOPRAM OXALATE 20 MG PO TABS: 20.0000 mg | ORAL_TABLET | Freq: Two times a day (BID) | ORAL | 2 refills | Status: AC

## 2021-02-05 NOTE — Progress Notes (Signed)
Virtual Visit via Video Note  I connected with Loretta Padilla on 02/05/21 at  1:00 PM EDT by a video enabled telemedicine application and verified that I am speaking with the correct person using two identifiers.  Location: Patient: home  Provider: home office   I discussed the limitations of evaluation and management by telemedicine and the availability of in person appointments. The patient expressed understanding and agreed to proceed.    I discussed the assessment and treatment plan with the patient. The patient was provided an opportunity to ask questions and all were answered. The patient agreed with the plan and demonstrated an understanding of the instructions.   The patient was advised to call back or seek an in-person evaluation if the symptoms worsen or if the condition fails to improve as anticipated.  I provided 15 minutes of non-face-to-face time during this encounter.   Diannia Ruder, MD  [facility] MD/PA/NP OP Progress Note  02/05/2021 1:19 PM Loretta Padilla  MRN:  [id number]  Chief Complaint:  Chief Complaint    Depression; Anxiety; Follow-up     HPI: This patient is a 35 year old married white female who lives with her children and husband in [place].  She is on disability for seizure disorder.  The patient returns for follow-up after 3 months.  She states that she is doing well.  She has not had any further seizures.  Her children are doing well.  Her air conditioning is out at her house but she does not report any other stressors right now.  She is sleeping well most of the time.  She denies significant mood swings depression anxiety or suicidal ideation. Visit Diagnosis:    ICD-10-CM   1. Moderate episode of recurrent major depressive disorder (HCC)  F33.1     Past Psychiatric History: Long-term outpatient treatment for depression  Past Medical History:  Past Medical History:  Diagnosis Date  . Anxiety   . Back pain   . Depression   . Kidney stones   .  Seizures (HCC)     Past Surgical History:  Procedure Laterality Date  . IMPLANTATION VAGAL NERVE STIMULATOR    . TUBAL LIGATION N/A 10/10/2018   Procedure: POST PARTUM TUBAL LIGATION;  Surgeon: Hermina Staggers, MD;  Location: [facility] BIRTHING SUITES;  Service: Gynecology;  Laterality: N/A;  . VSD REPAIR      Family Psychiatric History: see below  Family History:  Family History  Problem Relation Age of Onset  . Bipolar disorder Mother   . Alcohol abuse Mother   . Depression Sister   . Anxiety disorder Sister   . Alcohol abuse Maternal Grandfather   . Diabetes Maternal Grandfather   . Heart attack Maternal Grandfather   . Alcohol abuse Paternal Grandfather   . Heart disease Paternal Grandfather   . Other Father        unsure of history  . Brain cancer Father        Glioblastoma  . Asthma Son   . Other Son        EOE  . Colon cancer Maternal Grandmother   . Pneumonia Maternal Grandmother   . Other Maternal Grandmother        covid    Social History:  Social History   Socioeconomic History  . Marital status: Divorced    Spouse name: Not on file  . Number of children: 3  . Years of education: 3 years college  . Highest education level: Not on file  Occupational History  .  Occupation: Disabled  Tobacco Use  . Smoking status: Current Every Day Smoker    Packs/day: 0.50    Types: Cigarettes  . Smokeless tobacco: Never Used  Vaping Use  . Vaping Use: Never used  Substance and Sexual Activity  . Alcohol use: Not Currently    Comment: occ., 05-07-2017 per pt 2-3 times a mth  . Drug use: No  . Sexual activity: Yes    Birth control/protection: Surgical    Comment: tubal  Other Topics Concern  . Not on file  Social History Narrative   Lives at home with boyfriend and children.   Right-handed.   Occasional use of caffeine.      Social Determinants of Health   Financial Resource Strain: Not on file  Food Insecurity: Not on file  Transportation Needs: Not on file   Physical Activity: Not on file  Stress: Not on file  Social Connections: Not on file    Allergies:  Allergies  Allergen Reactions  . Adhesive [Tape]     Birth control patch and nicoderm patch   . Carbatrol [Carbamazepine] Rash  . Dilantin [Phenytoin Sodium Extended] Rash  . Phenobarbital Rash  . Tizanidine Hcl Rash  . Venlafaxine Rash    Metabolic Disorder Labs: No results found for: HGBA1C, MPG No results found for: PROLACTIN No results found for: CHOL, TRIG, HDL, CHOLHDL, VLDL, LDLCALC Lab Results  Component Value Date   TSH 2.940 05/20/2020   TSH 2.370 03/18/2020    Therapeutic Level Labs: No results found for: LITHIUM No results found for: VALPROATE No components found for:  CBMZ  Current Medications: Current Outpatient Medications  Medication Sig Dispense Refill  . acetaminophen (TYLENOL) 500 MG tablet 1 tablet as needed    . cariprazine (VRAYLAR) 1.5 MG capsule Take 1 capsule (1.5 mg total) by mouth daily. 30 capsule 2  . clonazePAM (KLONOPIN) 0.5 MG tablet Take 1 tablet (0.5 mg total) by mouth 2 (two) times daily as needed. for anxiety 60 tablet 2  . cyclobenzaprine (FLEXERIL) 10 MG tablet Take 1 tablet (10 mg total) by mouth 3 (three) times daily as needed for muscle spasms. 30 tablet 0  . escitalopram (LEXAPRO) 20 MG tablet Take 1 tablet (20 mg total) by mouth 2 (two) times daily. 60 tablet 2  . ibuprofen (ADVIL) 800 MG tablet 1 tablet with food or milk    . meloxicam (MOBIC) 7.5 MG tablet Take 1 tablet (7.5 mg total) by mouth daily as needed for pain. 30 tablet 6  . naproxen (NAPROSYN) 500 MG tablet Take 1 po BID with food prn pain 30 tablet 0  . ondansetron (ZOFRAN ODT) 4 MG disintegrating tablet Take 1 tablet (4 mg total) by mouth every 8 (eight) hours as needed. 20 tablet 6  . oxyCODONE-acetaminophen (PERCOCET) 5-325 MG tablet Take 1 tablet by mouth every 6 (six) hours as needed. 20 tablet 0  . predniSONE (DELTASONE) 10 MG tablet Take 2 tablets (20 mg  total) by mouth daily. 14 tablet 0  . SUMAtriptan (IMITREX) 100 MG tablet Take 1 tablet (100 mg total) by mouth once as needed for up to 1 dose for migraine. May repeat in 2 hours if headache persists or recurs. 12 tablet 6  . topiramate (TOPAMAX) 100 MG tablet Take 3 tablets (300 mg total) by mouth 2 (two) times daily. 540 tablet 4  . varenicline (CHANTIX) 0.5 MG tablet 1 tablet after eating with a full glass of water    . zolpidem (AMBIEN CR) 12.5  MG CR tablet Take 1 tablet (12.5 mg total) by mouth at bedtime as needed for sleep. 30 tablet 2   No current facility-administered medications for this visit.     Musculoskeletal: Strength & Muscle Tone: within normal limits Gait & Station: normal Patient leans: N/A  Psychiatric Specialty Exam: Review of Systems  All other systems reviewed and are negative.   There were no vitals taken for this visit.There is no height or weight on file to calculate BMI.  General Appearance: Casual and Fairly Groomed  Eye Contact:  Good  Speech:  Clear and Coherent  Volume:  Normal  Mood:  Euthymic  Affect:  Appropriate and Congruent  Thought Process:  Goal Directed  Orientation:  Full (Time, Place, and Person)  Thought Content: WDL   Suicidal Thoughts:  No  Homicidal Thoughts:  No  Memory:  Immediate;   Good Recent;   Good Remote;   Good  Judgement:  Good  Insight:  Good  Psychomotor Activity:  Normal  Concentration:  Concentration: Good and Attention Span: Good  Recall:  Good  Fund of Knowledge: Good  Language: Good  Akathisia:  No  Handed:  Right  AIMS (if indicated): not done  Assets:  Communication Skills Desire for Improvement Physical Health Resilience Social Support Talents/Skills  ADL's:  Intact  Cognition: WNL  Sleep:  Good   Screenings: PHQ2-9   Flowsheet Row [facility] Video Visit from 11/06[facility]2020 [facility] Initial Prenatal from 03/15/2018 in [facility]  PHQ-2 Total Score 0 2 1  PHQ-9 Total Score -- 12 9    Flowsheet Row Video Visit from 02/05/2021 in [facility] ED from 01/25/2021 in [facility] EMERGENCY DEPARTMENT  C-SSRS RISK CATEGORY No Risk No Risk       Assessment and Plan: This patient is a 35 year old female with a history of seizure disorder depression mood swings and anxiety.  She is doing well on her current regimen.  She will continue Lexapro 20 mg twice daily for depression, Vraylar 1.5 mg daily for mood swings, clonazepam 0.5 mg twice daily as needed for anxiety and Ambien CR 12.5 mg at bedtime for sleep.  She will return to see me in 3 months   Diannia Ruder, MD 02/05/2021, 1:19 PM

## 2021-02-07 ENCOUNTER — Other Ambulatory Visit: Payer: Medicare Other | Admitting: Adult Health

## 2021-02-08 DIAGNOSIS — M5459 Other low back pain: Secondary | ICD-10-CM | POA: Diagnosis not present

## 2021-02-08 DIAGNOSIS — M5136 Other intervertebral disc degeneration, lumbar region: Secondary | ICD-10-CM | POA: Diagnosis not present

## 2021-02-08 DIAGNOSIS — M5030 Other cervical disc degeneration, unspecified cervical region: Secondary | ICD-10-CM | POA: Diagnosis not present

## 2021-02-08 DIAGNOSIS — M5412 Radiculopathy, cervical region: Secondary | ICD-10-CM | POA: Diagnosis not present

## 2021-02-28 DIAGNOSIS — M542 Cervicalgia: Secondary | ICD-10-CM | POA: Diagnosis not present

## 2021-02-28 DIAGNOSIS — M546 Pain in thoracic spine: Secondary | ICD-10-CM | POA: Diagnosis not present

## 2021-02-28 DIAGNOSIS — M9901 Segmental and somatic dysfunction of cervical region: Secondary | ICD-10-CM | POA: Diagnosis not present

## 2021-02-28 DIAGNOSIS — M9903 Segmental and somatic dysfunction of lumbar region: Secondary | ICD-10-CM | POA: Diagnosis not present

## 2021-02-28 DIAGNOSIS — M9902 Segmental and somatic dysfunction of thoracic region: Secondary | ICD-10-CM | POA: Diagnosis not present

## 2021-03-03 DIAGNOSIS — M9903 Segmental and somatic dysfunction of lumbar region: Secondary | ICD-10-CM | POA: Diagnosis not present

## 2021-03-03 DIAGNOSIS — M9902 Segmental and somatic dysfunction of thoracic region: Secondary | ICD-10-CM | POA: Diagnosis not present

## 2021-03-03 DIAGNOSIS — M542 Cervicalgia: Secondary | ICD-10-CM | POA: Diagnosis not present

## 2021-03-03 DIAGNOSIS — M546 Pain in thoracic spine: Secondary | ICD-10-CM | POA: Diagnosis not present

## 2021-03-03 DIAGNOSIS — M9901 Segmental and somatic dysfunction of cervical region: Secondary | ICD-10-CM | POA: Diagnosis not present

## 2021-03-07 DIAGNOSIS — M9901 Segmental and somatic dysfunction of cervical region: Secondary | ICD-10-CM | POA: Diagnosis not present

## 2021-03-07 DIAGNOSIS — M542 Cervicalgia: Secondary | ICD-10-CM | POA: Diagnosis not present

## 2021-03-07 DIAGNOSIS — M9903 Segmental and somatic dysfunction of lumbar region: Secondary | ICD-10-CM | POA: Diagnosis not present

## 2021-03-07 DIAGNOSIS — M546 Pain in thoracic spine: Secondary | ICD-10-CM | POA: Diagnosis not present

## 2021-03-07 DIAGNOSIS — M9902 Segmental and somatic dysfunction of thoracic region: Secondary | ICD-10-CM | POA: Diagnosis not present

## 2021-03-14 DIAGNOSIS — M9902 Segmental and somatic dysfunction of thoracic region: Secondary | ICD-10-CM | POA: Diagnosis not present

## 2021-03-14 DIAGNOSIS — M542 Cervicalgia: Secondary | ICD-10-CM | POA: Diagnosis not present

## 2021-03-14 DIAGNOSIS — M546 Pain in thoracic spine: Secondary | ICD-10-CM | POA: Diagnosis not present

## 2021-03-14 DIAGNOSIS — M9901 Segmental and somatic dysfunction of cervical region: Secondary | ICD-10-CM | POA: Diagnosis not present

## 2021-03-14 DIAGNOSIS — M9903 Segmental and somatic dysfunction of lumbar region: Secondary | ICD-10-CM | POA: Diagnosis not present

## 2021-03-16 ENCOUNTER — Telehealth: Payer: Self-pay | Admitting: Neurology

## 2021-03-16 NOTE — Telephone Encounter (Signed)
I called the patient.  The patient indicates that she had a seizure while taking a nap today.  She indicates that while sleeping she had vivid dreams and had a sensation that she might be jerking.  She woke up somewhat disoriented, she did not bite her tongue or lose control of the bowels or the bladder.  No one was around to witness whether or not she had a seizure.  She is on Topamax taking 300 mg twice daily.  She has been on Onfi in the past without much benefit.  She has a vagal nerve stimulator in place.  She claims that she has done well with seizure control for 6 months.  She has not been sick, she has not missed a dose of medications.  She is to stay on her current dose of Topamax, I will forward this message to Dr. Maple Hudson on to determine whether anything needs to be done from here.  The patient has actually done better than her usual recently with her seizure control.  It is not definite that she actually had a seizure as she has some recollection of the event.

## 2021-03-18 DIAGNOSIS — M9901 Segmental and somatic dysfunction of cervical region: Secondary | ICD-10-CM | POA: Diagnosis not present

## 2021-03-18 DIAGNOSIS — M9903 Segmental and somatic dysfunction of lumbar region: Secondary | ICD-10-CM | POA: Diagnosis not present

## 2021-03-18 DIAGNOSIS — M9902 Segmental and somatic dysfunction of thoracic region: Secondary | ICD-10-CM | POA: Diagnosis not present

## 2021-03-18 DIAGNOSIS — M546 Pain in thoracic spine: Secondary | ICD-10-CM | POA: Diagnosis not present

## 2021-03-18 DIAGNOSIS — M542 Cervicalgia: Secondary | ICD-10-CM | POA: Diagnosis not present

## 2021-03-18 NOTE — Telephone Encounter (Signed)
Give her a follow up visit with Maralyn Sago or me. Last visit was in Dec 2021

## 2021-03-18 NOTE — Telephone Encounter (Signed)
Spoke to pt, scheduled appt with MD

## 2021-03-20 DIAGNOSIS — M5459 Other low back pain: Secondary | ICD-10-CM | POA: Diagnosis not present

## 2021-03-20 DIAGNOSIS — M542 Cervicalgia: Secondary | ICD-10-CM | POA: Diagnosis not present

## 2021-03-20 DIAGNOSIS — M5412 Radiculopathy, cervical region: Secondary | ICD-10-CM | POA: Diagnosis not present

## 2021-03-20 DIAGNOSIS — M47812 Spondylosis without myelopathy or radiculopathy, cervical region: Secondary | ICD-10-CM | POA: Diagnosis not present

## 2021-03-21 DIAGNOSIS — M9901 Segmental and somatic dysfunction of cervical region: Secondary | ICD-10-CM | POA: Diagnosis not present

## 2021-03-21 DIAGNOSIS — M542 Cervicalgia: Secondary | ICD-10-CM | POA: Diagnosis not present

## 2021-03-21 DIAGNOSIS — M546 Pain in thoracic spine: Secondary | ICD-10-CM | POA: Diagnosis not present

## 2021-03-21 DIAGNOSIS — M9902 Segmental and somatic dysfunction of thoracic region: Secondary | ICD-10-CM | POA: Diagnosis not present

## 2021-03-21 DIAGNOSIS — M9903 Segmental and somatic dysfunction of lumbar region: Secondary | ICD-10-CM | POA: Diagnosis not present

## 2021-03-25 ENCOUNTER — Other Ambulatory Visit: Payer: Self-pay

## 2021-03-25 ENCOUNTER — Encounter (HOSPITAL_COMMUNITY): Payer: Self-pay | Admitting: Physical Therapy

## 2021-03-25 ENCOUNTER — Ambulatory Visit (HOSPITAL_COMMUNITY): Payer: Medicare Other | Attending: Physical Medicine and Rehabilitation | Admitting: Physical Therapy

## 2021-03-25 DIAGNOSIS — M542 Cervicalgia: Secondary | ICD-10-CM

## 2021-03-25 DIAGNOSIS — M6281 Muscle weakness (generalized): Secondary | ICD-10-CM

## 2021-03-25 DIAGNOSIS — R2689 Other abnormalities of gait and mobility: Secondary | ICD-10-CM | POA: Diagnosis not present

## 2021-03-25 DIAGNOSIS — M545 Low back pain, unspecified: Secondary | ICD-10-CM | POA: Diagnosis not present

## 2021-03-25 DIAGNOSIS — R29898 Other symptoms and signs involving the musculoskeletal system: Secondary | ICD-10-CM

## 2021-03-25 NOTE — Therapy (Signed)
[facility] [address] [place], [place], [address] Phone: [phone]   Fax:  [phone]  Physical Therapy Evaluation  Patient Details  Name: Loretta Padilla MRN: [id number] Date of Birth: 15-Jul-1986 Referring Provider (PT): Sheran Luz MD   Encounter Date: 03/25/2021   PT End of Session - 03/25/21 1044     Visit Number 1    Number of Visits 12   2x/week for 6 weeks starting in 2 weeks after vacation   Date for PT Re-Evaluation 05/20/21    Authorization Type Primary UHC medicare Secondary medicaid    Progress Note Due on Visit 10    PT Start Time 1000    PT Stop Time 1043    PT Time Calculation (min) 43 min    Activity Tolerance Patient tolerated treatment well    Behavior During Therapy [facility] for tasks assessed/performed             Past Medical History:  Diagnosis Date   Anxiety    Back pain    Depression    Kidney stones    Seizures (HCC)     Past Surgical History:  Procedure Laterality Date   IMPLANTATION VAGAL NERVE STIMULATOR     TUBAL LIGATION N/A 10/10/2018   Procedure: POST PARTUM TUBAL LIGATION;  Surgeon: Hermina Staggers, MD;  [facility];  Service: Gynecology;  Laterality: N/A;   VSD REPAIR      There were no vitals filed for this visit.    Subjective Assessment - 03/25/21 1000     Subjective Patient is a 35 y.o. female who presents to physical therapy with c/o chronic LBP and neck pain. She has epilepsy and she frequently has seizures. She has had last seizure about 6 months ago. Patient states increase in symptoms with lifting, bending, stretching, chores. Her neck is really stiff. She was doing injections at [facility] and now the injections are not helping as much as she'd hoped. She states that they wanted to get her started on aquatic therapy. She feels that nothing is fully going to help her symptoms. Her main goal is relief of symptoms. Numbness in back of legs to the toes and  cramping in calves    Limitations Lifting;Standing;Walking;House hold activities    Patient Stated Goals decrease symptoms    Currently in Pain? Yes    Pain Score 7     Pain Location Back   and neck   Pain Descriptors / Indicators Aching    Pain Type Chronic pain    Pain Onset More than a month ago    Pain Frequency Constant    Aggravating Factors  movement    Pain Relieving Factors sitting, rest, laying                [facility] PT Assessment - 03/25/21 0001       Assessment   Medical Diagnosis LBP    Referring Provider (PT) Sheran Luz MD    Onset Date/Surgical Date 03/25/01    Next MD Visit 6 weeks    Prior Therapy yes years ago      Precautions   Precautions Other (comment)   seizure     Restrictions   Weight Bearing Restrictions No      Balance Screen   Has the patient fallen in the past 6 months No    Has the patient had a decrease in activity level because of a fear of falling?  No    Is  the patient reluctant to leave their home because of a fear of falling?  No      Prior Function   Level of Independence Independent    Vocation On disability      Cognition   Overall Cognitive Status Within Functional Limits for tasks assessed      Observation/Other Assessments   Observations Ambulates without AD    Focus on Therapeutic Outcomes (FOTO)  39% function      Sensation   Light Touch Appears Intact    Additional Comments increased light touch R L5      ROM / Strength   AROM / PROM / Strength AROM;Strength      AROM   AROM Assessment Site Lumbar;Cervical    Cervical Flexion 0% limited pulling in posterior superior cervical spine    Cervical Extension 25% limited, pulling between scapulas    Cervical - Right Side Bend 25% limited, pulling UT contralateral    Cervical - Left Side Bend 25% limited, pulling UT contralateral    Cervical - Right Rotation 50% limited, pain in neck    Cervical - Left Rotation 25% limited, pulling between scapulas    Lumbar  Flexion 50% limited, pain in whole back    Lumbar Extension 0% limited, feels good    Lumbar - Right Side Bend 25% limited, pulling L QL    Lumbar - Left Side Bend 25% limited    Lumbar - Right Rotation 0% limited    Lumbar - Left Rotation 0% limited      Strength   Strength Assessment Site Hip;Knee;Ankle    Right/Left Hip Right;Left    Right Hip Flexion 4+/5    Right Hip Extension 3-/5    Right Hip ABduction 4/5    Left Hip Flexion 4+/5    Left Hip Extension 3-/5    Left Hip ABduction 3+/5    Right/Left Knee Right;Left    Right Knee Flexion 4+/5    Right Knee Extension 4+/5    Left Knee Flexion 5/5    Left Knee Extension 5/5    Right/Left Ankle Right;Left    Right Ankle Dorsiflexion 4+/5    Left Ankle Dorsiflexion 5/5      Palpation   Palpation comment grossly tender throughout entire back and bilateral hips      Transfers   Five time sit to stand comments  9.88 seconds without UE support, dynamic knee valgus bilaterally                        Objective measurements completed on examination: See above findings.       [facility] Adult PT Treatment/Exercise - 03/25/21 0001       Exercises   Exercises Lumbar      Lumbar Exercises: Supine   Ab Set 10 reps;5 seconds    Bent Knee Raise 10 reps;5 seconds    Bent Knee Raise Limitations with ab set    Bridge 10 reps                    PT Education - 03/25/21 1000     Education Details Patient educated on exam findings, POC, scope of PT, HEP    Person(s) Educated Patient    Methods Explanation;Handout    Comprehension Verbalized understanding              PT Short Term Goals - 03/25/21 1051       PT SHORT TERM GOAL #1  Title Patient will be independent with HEP in order to improve functional outcomes.    Time 3    Period Weeks    Target Date 04/29/21      PT SHORT TERM GOAL #2   Title Patient will report at least 25% improvement in symptoms for improved quality of life.    Time 3     Period Weeks    Status New    Target Date 04/29/21               PT Long Term Goals - 03/25/21 1051       PT LONG TERM GOAL #1   Title Patient will report at least 75% improvement in symptoms for improved quality of life.    Time 6    Period Weeks    Status New    Target Date 05/20/21      PT LONG TERM GOAL #2   Title Patient will improve FOTO score by at least 10 points in order to indicate improved tolerance to activity.    Time 6    Period Weeks    Status New    Target Date 05/20/21      PT LONG TERM GOAL #3   Title Patient will demonstrate at least 25% improvement in lumbar and cervical  ROM in all restricted planes for improved ability to move head while at at home    Time 6    Period Weeks    Status New    Target Date 05/20/21                    Plan - 03/25/21 1047     Clinical Impression Statement Patient is a 35 y.o. female who presents to physical therapy with c/o chronic LBP and neck pain. She presents with pain limited deficits in lumbar and cervical strength, ROM, endurance, postural impairments, spinal mobility, and functional mobility with ADL. She is having to modify and restrict ADL as indicated by FOTO score as well as subjective information and objective measures which is affecting overall participation. Patient at end of session stating fear of drowning and educated on possibly not completing aquatic therapy although patient may have benefit from aquatic therapy. Patient performs and given handout with initial HEP. Patient will benefit from skilled physical therapy in order to improve function and reduce impairment.    Personal Factors and Comorbidities Comorbidity 3+    Comorbidities Anxiety, Arthritis, Back pain, BMI over 30,  Depression, Headaches, Previous accidents, Prior Surgery, Seizures, Sleep dysfunction, VNS    Examination-Activity Limitations Locomotion Level;Transfers;Stand;Squat;Sit;Lift;Carry;Caring for Others;Bend     Examination-Participation Restrictions Meal Prep;Cleaning;Occupation;Community Activity;Shop;Volunteer;[employer] Work;Laundry    Stability/Clinical Decision Making Stable/Uncomplicated    Clinical Decision Making Low    Rehab Potential Fair    PT Frequency 2x / week    PT Duration 6 weeks   beginning in 2 weeks after vacation   PT Treatment/Interventions ADLs/Self Care Home Management;Aquatic Therapy;Cryotherapy;Electrical Stimulation;Iontophoresis 4mg /ml Dexamethasone;Moist Heat;Traction;Ultrasound;DME Instruction;Gait training;Functional mobility training;Therapeutic activities;Stair training;Therapeutic exercise;Balance training;Neuromuscular re-education;Patient/family education;Orthotic Fit/Training;Manual techniques;Manual lymph drainage;Compression bandaging;Scar mobilization;Passive range of motion;Dry needling;Energy conservation;Splinting;Taping;Vasopneumatic Device;Spinal Manipulations;Joint Manipulations    PT Next Visit Plan possibly aquatics, core and hip strengthening, postural strength; further investigate neck/headache symptoms in future    PT Home Exercise Plan 7/19 bridge, SLR, Ab set/PPT    Consulted and Agree with Plan of Care Patient             Patient will benefit from skilled therapeutic intervention in order  to improve the following deficits and impairments:  Decreased range of motion, Difficulty walking, Decreased endurance, Increased muscle spasms, Decreased activity tolerance, Pain, Decreased balance, Impaired flexibility, Improper body mechanics, Decreased mobility, Decreased strength, Postural dysfunction  Visit Diagnosis: Low back pain, unspecified back pain laterality, unspecified chronicity, unspecified whether sciatica present  Cervicalgia  Muscle weakness (generalized)  Other abnormalities of gait and mobility  Other symptoms and signs involving the musculoskeletal system     Problem List Patient Active Problem List   Diagnosis Date Noted   Weight  gain 01/10/2021   Menstrual period late 01/10/2021   Pregnancy examination or test, negative result 01/10/2021   Seizures (HCC) 05/20/2020   Chronic migraine without aura without status migrainosus, not intractable 05/20/2020   Polypharmacy 05/20/2020   Body mass index 27.0-27.9, adult 04/03/2020   Weight loss counseling, encounter for 04/03/2020   Abnormal uterine bleeding (AUB) 03/18/2020   Lactation symptom 03/18/2020   History of bilateral tubal ligation 11/15/2018   Insomnia 03/15/2018   Epilepsy (HCC) 11/23/2017   Smoker 08/03/2017   Depression 05/07/2017    10:54 AM, 03/25/21 Wyman Songster PT, DPT Physical Therapist at [facility]    [facility] [address] [place], [place], [address] Phone: [phone]   Fax:  [phone]  Name: Loretta Padilla MRN: [id number] Date of Birth: 31-May-1986

## 2021-03-25 NOTE — Patient Instructions (Signed)
Access Code: [email or web address] URL: [email or web address] Date: 03/25/2021 Prepared by: Greig Castilla Antonisha Waskey  Exercises Supine Bridge - 2 x daily - 7 x weekly - 2 sets - 10 reps Abdominal Bracing - 2 x daily - 7 x weekly - 10 reps - 5 second hold Supine March - 2 x daily - 7 x weekly - 10 reps

## 2021-04-02 ENCOUNTER — Other Ambulatory Visit: Payer: Self-pay | Admitting: Neurology

## 2021-04-02 ENCOUNTER — Other Ambulatory Visit: Payer: Self-pay

## 2021-04-02 NOTE — Patient Outreach (Signed)
[facility]) Care Management  04/02/2021  Loretta Padilla 08-14-1986 [id number]  EMMI Join Call. CMA verified patients information and spoke with patient regarding [facility] Care Management. Patient stated that she is concerned with unsuccessful weight loss but does not have any other pre existing conditions that are contributing.   After explaining to patient what services [facility] CM does and asking if their were any resources she needed, Ms. Bou declined at this time.   CMA will mail unsuccessful letter along with 24 hour nurse line magnet for patient to use if needed in the future.  Baruch Gouty [facility] Management Assistant [phone]

## 2021-04-04 DIAGNOSIS — T17200A Unspecified foreign body in pharynx causing asphyxiation, initial encounter: Secondary | ICD-10-CM | POA: Diagnosis not present

## 2021-04-04 DIAGNOSIS — T17208A Unspecified foreign body in pharynx causing other injury, initial encounter: Secondary | ICD-10-CM | POA: Diagnosis not present

## 2021-04-07 ENCOUNTER — Ambulatory Visit (HOSPITAL_COMMUNITY): Payer: Medicare Other | Attending: Physical Medicine and Rehabilitation | Admitting: Physical Therapy

## 2021-04-07 ENCOUNTER — Other Ambulatory Visit: Payer: Self-pay

## 2021-04-07 DIAGNOSIS — M542 Cervicalgia: Secondary | ICD-10-CM | POA: Insufficient documentation

## 2021-04-07 DIAGNOSIS — R2689 Other abnormalities of gait and mobility: Secondary | ICD-10-CM | POA: Diagnosis not present

## 2021-04-07 DIAGNOSIS — M6281 Muscle weakness (generalized): Secondary | ICD-10-CM | POA: Insufficient documentation

## 2021-04-07 DIAGNOSIS — M545 Low back pain, unspecified: Secondary | ICD-10-CM | POA: Diagnosis not present

## 2021-04-07 DIAGNOSIS — R29898 Other symptoms and signs involving the musculoskeletal system: Secondary | ICD-10-CM | POA: Diagnosis not present

## 2021-04-07 NOTE — Therapy (Addendum)
[facility] [address] [place], [place], [address] Phone: [phone]   Fax:  [phone]  Physical Therapy Treatment  Patient Details  Name: Loretta Padilla MRN: [id number] Date of Birth: Sep 11, 1985 Referring Provider (PT): Sheran Luz MD   Encounter Date: 04/07/2021   PT End of Session - 04/07/21 1422     Visit Number 2    Number of Visits 12    Date for PT Re-Evaluation 05/20/21    Authorization Type Primary UHC medicare Secondary medicaid    Progress Note Due on Visit 10    PT Start Time 1400    PT Stop Time 1440    PT Time Calculation (min) 40 min             Past Medical History:  Diagnosis Date   Anxiety    Back pain    Depression    Kidney stones    Seizures (HCC)     Past Surgical History:  Procedure Laterality Date   IMPLANTATION VAGAL NERVE STIMULATOR     TUBAL LIGATION N/A 10/10/2018   Procedure: POST PARTUM TUBAL LIGATION;  Surgeon: Hermina Staggers, MD;  [facility];  Service: Gynecology;  Laterality: N/A;   VSD REPAIR      There were no vitals filed for this visit.   Subjective Assessment - 04/07/21 1402     Subjective Pt states her LBP is a constant 8/10 from cervical spine all way down to lumbar. STates she's been [place] a 2 [place] and slept on a couch.    Currently in Pain? Yes    Pain Score 8     Pain Location Back    Pain Orientation Lower    Pain Descriptors / Indicators Aching    Pain Type Chronic pain                [facility] Adult PT Treatment/Exercise - 04/121 0001                Exercises    Exercises Lumbar         Lumbar Exercises: Supine    Ab Set 10 reps;5 seconds    Bent Knee Raise 10 reps;5 seconds    Bent Knee Raise Limitations with ab set    Bridge Straight leg raise Hip abduction   Stretches Hamstring stretch Piriformis stretch 10 reps  10 reps each LE with stabilization    Supine 3X30" each LE Seated 3X30" each LE                           PT Education - 04/07/21 1419     Education Details reviewed goals, HEP and POC moving forward.  discussed importance of completing HEP and keeping correct count of repeitions.    Person(s) Educated Patient    Methods Explanation;Demonstration;Tactile cues;Verbal cues    Comprehension Verbalized understanding              PT Short Term Goals - 04/07/21 1502       PT SHORT TERM GOAL #1   Title Patient will be independent with HEP in order to improve functional outcomes.    Time 3    Period Weeks    Status On-going    Target Date 04/29/21      PT SHORT TERM GOAL #2   Title Patient will report at least 25% improvement in symptoms for improved quality of life.  Time 3    Period Weeks    Status On-going    Target Date 04/29/21               PT Long Term Goals - 04/07/21 1506       PT LONG TERM GOAL #1   Title Patient will report at least 75% improvement in symptoms for improved quality of life.    Time 6    Period Weeks    Status On-going      PT LONG TERM GOAL #2   Title Patient will improve FOTO score by at least 10 points in order to indicate improved tolerance to activity.    Time 6    Period Weeks    Status On-going      PT LONG TERM GOAL #3   Title Patient will demonstrate at least 25% improvement in lumbar and cervical  ROM in all restricted planes for improved ability to move head while at at home    Time 6    Period Weeks    Status On-going                   Plan - 04/07/21 1503     Clinical Impression Statement .    Pt has been on vacation and has not completed her HEP; Review of HEP and no recall or ability to keep count of therex.  Discussed importance of completing HEP and doing the right amount of repetitions so she does not flare up her symptoms.   Reviewed goals.  Progressed core strengthening activities.  Added hamstring stretch with noted tightness bilaterally.  Added piriformis  stretch with more tightness noted in Lt than Rt LE    Personal Factors and Comorbidities Comorbidity 3+    Comorbidities Anxiety, Arthritis, Back pain, BMI over 30,  Depression, Headaches, Previous accidents, Prior Surgery, Seizures, Sleep dysfunction, VNS    Examination-Activity Limitations Locomotion Level;Transfers;Stand;Squat;Sit;Lift;Carry;Caring for Others;Bend    Examination-Participation Restrictions Meal Prep;Cleaning;Occupation;Community Activity;Shop;Volunteer;[employer] [facility]    Stability/Clinical Decision Making Stable/Uncomplicated    Rehab Potential Fair    PT Frequency 2x / week    PT Duration 6 weeks   beginning in 2 weeks after vacation   PT Treatment/Interventions ADLs/Self Care Home Management;Aquatic Therapy;Cryotherapy;Electrical Stimulation;Iontophoresis 4mg /ml Dexamethasone;Moist Heat;Traction;Ultrasound;DME Instruction;Gait training;Functional mobility training;Therapeutic activities;Stair training;Therapeutic exercise;Balance training;Neuromuscular re-education;Patient/family education;Orthotic Fit/Training;Manual techniques;Manual lymph drainage;Compression bandaging;Scar mobilization;Passive range of motion;Dry needling;Energy conservation;Splinting;Taping;Vasopneumatic Device;Spinal Manipulations;Joint Manipulations    PT Next Visit Plan progress core and hip strengthening, postural strength; further investigate neck/headache symptoms in future. check SI for alignment.    PT Home Exercise Plan 7/19 bridge, SLR, Ab set/PPT    Consulted and Agree with Plan of Care Patient             Patient will benefit from skilled therapeutic intervention in order to improve the following deficits and impairments:  Decreased range of motion, Difficulty walking, Decreased endurance, Increased muscle spasms, Decreased activity tolerance, Pain, Decreased balance, Impaired flexibility, Improper body mechanics, Decreased mobility, Decreased strength, Postural dysfunction  Visit  Diagnosis: Low back pain, unspecified back pain laterality, unspecified chronicity, unspecified whether sciatica present  Cervicalgia  Muscle weakness (generalized)  Other abnormalities of gait and mobility  Other symptoms and signs involving the musculoskeletal system     Problem List Patient Active Problem List   Diagnosis Date Noted   Weight gain 01/10/2021   Menstrual period late 01/10/2021   Pregnancy examination or test, negative result 01/10/2021   Seizures (HCC) 05/20/2020   Chronic  migraine without aura without status migrainosus, not intractable 05/20/2020   Polypharmacy 05/20/2020   Body mass index 27.0-27.9, adult 04/03/2020   Weight loss counseling, encounter for 04/03/2020   Abnormal uterine bleeding (AUB) 03/18/2020   Lactation symptom 03/18/2020   History of bilateral tubal ligation 11/15/2018   Insomnia 03/15/2018   Epilepsy (HCC) 11/23/2017   Smoker 08/03/2017   Depression 05/07/2017   Lurena Nida, PTA/CLT [phone]  Lurena Nida 04/07/2021, 3:06 PM  [place] [facility] [address] [place], [place], [address] Phone: [phone]   Fax:  [phone]  Name: Loretta Padilla MRN: [id number] Date of Birth: 24-Sep-1985

## 2021-04-09 ENCOUNTER — Ambulatory Visit (HOSPITAL_COMMUNITY): Payer: Medicare Other | Admitting: Physical Therapy

## 2021-04-10 ENCOUNTER — Telehealth (HOSPITAL_COMMUNITY): Payer: Self-pay | Admitting: Physical Therapy

## 2021-04-10 NOTE — Telephone Encounter (Signed)
Patient no show for appointment on 04/09/21. Spoke with patient who stated she had a migraine and reminded her of next appointments.   3:18 PM, 04/10/21 Wyman Songster PT, DPT Physical Therapist at [facility]

## 2021-04-11 DIAGNOSIS — M9903 Segmental and somatic dysfunction of lumbar region: Secondary | ICD-10-CM | POA: Diagnosis not present

## 2021-04-11 DIAGNOSIS — M9901 Segmental and somatic dysfunction of cervical region: Secondary | ICD-10-CM | POA: Diagnosis not present

## 2021-04-11 DIAGNOSIS — M542 Cervicalgia: Secondary | ICD-10-CM | POA: Diagnosis not present

## 2021-04-11 DIAGNOSIS — M9902 Segmental and somatic dysfunction of thoracic region: Secondary | ICD-10-CM | POA: Diagnosis not present

## 2021-04-11 DIAGNOSIS — M546 Pain in thoracic spine: Secondary | ICD-10-CM | POA: Diagnosis not present

## 2021-04-14 ENCOUNTER — Ambulatory Visit (HOSPITAL_COMMUNITY): Payer: Medicare Other | Admitting: Physical Therapy

## 2021-04-14 ENCOUNTER — Other Ambulatory Visit: Payer: Self-pay

## 2021-04-14 ENCOUNTER — Encounter (HOSPITAL_COMMUNITY): Payer: Self-pay | Admitting: Physical Therapy

## 2021-04-14 DIAGNOSIS — M6281 Muscle weakness (generalized): Secondary | ICD-10-CM

## 2021-04-14 DIAGNOSIS — M542 Cervicalgia: Secondary | ICD-10-CM

## 2021-04-14 DIAGNOSIS — M545 Low back pain, unspecified: Secondary | ICD-10-CM

## 2021-04-14 DIAGNOSIS — R2689 Other abnormalities of gait and mobility: Secondary | ICD-10-CM

## 2021-04-14 DIAGNOSIS — R29898 Other symptoms and signs involving the musculoskeletal system: Secondary | ICD-10-CM | POA: Diagnosis not present

## 2021-04-14 NOTE — Therapy (Signed)
[facility] [address] [place], [place], [address] Phone: [phone]   Fax:  [phone]  Physical Therapy Treatment  Patient Details  Name: Loretta Padilla MRN: [id number] Date of Birth: 1986-01-15 Referring Provider (PT): Sheran Luz MD   Encounter Date: 04/14/2021   PT End of Session - 04/14/21 1736     Visit Number 3    Number of Visits 12    Date for PT Re-Evaluation 05/20/21    Authorization Type Primary UHC medicare Secondary medicaid    Progress Note Due on Visit 10    PT Start Time 1350    PT Stop Time 1440    PT Time Calculation (min) 50 min    Activity Tolerance Patient tolerated treatment well    Behavior During Therapy [facility] for tasks assessed/performed             Past Medical History:  Diagnosis Date   Anxiety    Back pain    Depression    Kidney stones    Seizures (HCC)     Past Surgical History:  Procedure Laterality Date   IMPLANTATION VAGAL NERVE STIMULATOR     TUBAL LIGATION N/A 10/10/2018   Procedure: POST PARTUM TUBAL LIGATION;  Surgeon: Hermina Staggers, MD;  Location: [facility];  Service: Gynecology;  Laterality: N/A;   VSD REPAIR      There were no vitals filed for this visit.   Subjective Assessment - 04/14/21 1734     Subjective Patient states HEP hurts. She is trying to do these when she can but feels exercises are irritating things.    Currently in Pain? Yes    Pain Score 5     Pain Location Back    Pain Orientation Lower    Pain Descriptors / Indicators Aching    Pain Type Chronic pain                           Adult Aquatic Therapy - 04/14/21 1737       Treatment   Gait dynamic warmup, pool walking, sidestepping 4RT each    Exercises heel raise x 20, hip abduction/ extension x20 each, dumbbell butterfly x20, dumbbell adduction with dumbbell x 20, single arm dumbbell pull down 2 x 10                          PT Short Term Goals -  04/07/21 1502       PT SHORT TERM GOAL #1   Title Patient will be independent with HEP in order to improve functional outcomes.    Time 3    Period Weeks    Status On-going    Target Date 04/29/21      PT SHORT TERM GOAL #2   Title Patient will report at least 25% improvement in symptoms for improved quality of life.    Time 3    Period Weeks    Status On-going    Target Date 04/29/21               PT Long Term Goals - 04/07/21 1506       PT LONG TERM GOAL #1   Title Patient will report at least 75% improvement in symptoms for improved quality of life.    Time 6    Period Weeks    Status On-going      PT LONG TERM GOAL #2  Title Patient will improve FOTO score by at least 10 points in order to indicate improved tolerance to activity.    Time 6    Period Weeks    Status On-going      PT LONG TERM GOAL #3   Title Patient will demonstrate at least 25% improvement in lumbar and cervical  ROM in all restricted planes for improved ability to move head while at at home    Time 6    Period Weeks    Status On-going                   Plan - 04/14/21 1739     Clinical Impression Statement Patient tolerated session well today. Initiated aquatic therapy. Patient educated on proper form and function of all added exercises and benefit of performing activity in water setting. Patient cued on TA activation with stiff arm dumbbell pull down. Patient will continue to benefit from skilled therapy services to reduce deficits and improve functional ability.    Personal Factors and Comorbidities Comorbidity 3+    Comorbidities Anxiety, Arthritis, Back pain, BMI over 30,  Depression, Headaches, Previous accidents, Prior Surgery, Seizures, Sleep dysfunction, VNS    Examination-Activity Limitations Locomotion Level;Transfers;Stand;Squat;Sit;Lift;Carry;Caring for Others;Bend    Examination-Participation Restrictions Meal Prep;Cleaning;Occupation;Community  Activity;Shop;Volunteer;Pincus Badder [facility]    Stability/Clinical Decision Making Stable/Uncomplicated    Rehab Potential Fair    PT Frequency 2x / week    PT Duration 6 weeks   beginning in 2 weeks after vacation   PT Treatment/Interventions ADLs/Self Care Home Management;Aquatic Therapy;Cryotherapy;Electrical Stimulation;Iontophoresis 4mg /ml Dexamethasone;Moist Heat;Traction;Ultrasound;DME Instruction;Gait training;Functional mobility training;Therapeutic activities;Stair training;Therapeutic exercise;Balance training;Neuromuscular re-education;Patient/family education;Orthotic Fit/Training;Manual techniques;Manual lymph drainage;Compression bandaging;Scar mobilization;Passive range of motion;Dry needling;Energy conservation;Splinting;Taping;Vasopneumatic Device;Spinal Manipulations;Joint Manipulations    PT Next Visit Plan progress core and hip strengthening, postural strength; further investigate neck/headache symptoms in future. check SI for alignment.    PT Home Exercise Plan 7/19 bridge, SLR, Ab set/PPT    Consulted and Agree with Plan of Care Patient             Patient will benefit from skilled therapeutic intervention in order to improve the following deficits and impairments:  Decreased range of motion, Difficulty walking, Decreased endurance, Increased muscle spasms, Decreased activity tolerance, Pain, Decreased balance, Impaired flexibility, Improper body mechanics, Decreased mobility, Decreased strength, Postural dysfunction  Visit Diagnosis: Low back pain, unspecified back pain laterality, unspecified chronicity, unspecified whether sciatica present  Cervicalgia  Muscle weakness (generalized)  Other abnormalities of gait and mobility  Other symptoms and signs involving the musculoskeletal system     Problem List Patient Active Problem List   Diagnosis Date Noted   Weight gain 01/10/2021   Menstrual period late 01/10/2021   Pregnancy examination or test, negative  result 01/10/2021   Seizures (HCC) 05/20/2020   Chronic migraine without aura without status migrainosus, not intractable 05/20/2020   Polypharmacy 05/20/2020   Body mass index 27.0-27.9, adult 04/03/2020   Weight loss counseling, encounter for 04/03/2020   Abnormal uterine bleeding (AUB) 03/18/2020   Lactation symptom 03/18/2020   History of bilateral tubal ligation 11/15/2018   Insomnia 03/15/2018   Epilepsy (HCC) 11/23/2017   Smoker 08/03/2017   Depression 05/07/2017   5:42 PM, 04/14/21 06/14/21 PT DPT  Physical Therapist with [place]  [facility]  [phone]   [facility] [address] [place], [place], [place] Phone: [phone]   Fax:  [phone]  Name: Loretta Padilla MRN: [name] Date  of Birth: 08-28-1986

## 2021-04-15 ENCOUNTER — Other Ambulatory Visit: Payer: Self-pay

## 2021-04-15 ENCOUNTER — Emergency Department (HOSPITAL_COMMUNITY)
Admission: EM | Admit: 2021-04-15 | Discharge: 2021-04-15 | Disposition: A | Discharge status: Home / Self Care | Payer: Medicare Other | Attending: Emergency Medicine | Admitting: Emergency Medicine

## 2021-04-15 ENCOUNTER — Emergency Department (HOSPITAL_COMMUNITY): Payer: Medicare Other

## 2021-04-15 ENCOUNTER — Encounter (HOSPITAL_COMMUNITY): Payer: Self-pay

## 2021-04-15 DIAGNOSIS — R0789 Other chest pain: Secondary | ICD-10-CM | POA: Insufficient documentation

## 2021-04-15 DIAGNOSIS — R600 Localized edema: Secondary | ICD-10-CM | POA: Diagnosis not present

## 2021-04-15 DIAGNOSIS — R079 Chest pain, unspecified: Secondary | ICD-10-CM | POA: Diagnosis not present

## 2021-04-15 DIAGNOSIS — F17210 Nicotine dependence, cigarettes, uncomplicated: Secondary | ICD-10-CM | POA: Diagnosis not present

## 2021-04-15 DIAGNOSIS — R000 Tachycardia, unspecified: Secondary | ICD-10-CM | POA: Diagnosis not present

## 2021-04-15 DIAGNOSIS — R5383 Other fatigue: Secondary | ICD-10-CM | POA: Insufficient documentation

## 2021-04-15 LAB — CBC
HCT: 41.8 % (ref 36.0–46.0)
Hemoglobin: 13.8 g/dL (ref 12.0–15.0)
MCH: 31.4 pg (ref 26.0–34.0)
MCHC: 33 g/dL (ref 30.0–36.0)
MCV: 95.2 fL (ref 80.0–100.0)
Platelets: 292 10*3/uL (ref 150–400)
RBC: 4.39 MIL/uL (ref 3.87–5.11)
RDW: 12.4 % (ref 11.5–15.5)
WBC: 6.3 10*3/uL (ref 4.0–10.5)
nRBC: 0 % (ref 0.0–0.2)

## 2021-04-15 LAB — BASIC METABOLIC PANEL
Anion gap: 8 (ref 5–15)
BUN: 22 mg/dL — ABNORMAL HIGH (ref 6–20)
CO2: 21 mmol/L — ABNORMAL LOW (ref 22–32)
Calcium: 8.9 mg/dL (ref 8.9–10.3)
Chloride: 107 mmol/L (ref 98–111)
Creatinine, Ser: 1.05 mg/dL — ABNORMAL HIGH (ref 0.44–1.00)
GFR, Estimated: >60 mL/min (ref >60)
Glucose, Bld: 87 mg/dL (ref 70–99)
Potassium: 3.5 mmol/L (ref 3.5–5.1)
Sodium: 136 mmol/L (ref 135–145)

## 2021-04-15 LAB — TROPONIN I (HIGH SENSITIVITY): Troponin I (High Sensitivity): <2 ng/L (ref <18)

## 2021-04-15 NOTE — Discharge Instructions (Addendum)
Your lab tests, EKG and chest x-ray are negative for any obvious source of your symptoms.  Please follow-up with your primary doctor if we have persistent symptoms.  I do suggest you contacting Dr. Zannie Cove office tomorrow to let her know that you are having issues with your vagal nerve stimulator, she may want you to come in sooner than your scheduled visit at the end of this month.

## 2021-04-15 NOTE — ED Triage Notes (Signed)
Pt. States they have been have chest pain and it feels like their heart is being squeezed. Pt. States their vns device has been alarming.

## 2021-04-15 NOTE — ED Provider Notes (Signed)
[facility] EMERGENCY DEPARTMENT Provider Note   CSN: [id number] Arrival date & time: 04/15/21  1747     History Chief Complaint  Patient presents with  . Chest Pain    Loretta Padilla is a 35 y.o. female with a past medical history including anxiety, seizure disorder, chronic back pain, surgical history significant for a vagal nerve stimulator presenting for evaluation of chest pain described as tightness and also states her vagal nerve stimulator has been alarming every few minutes whereas it used to only alarm when her . Additional complaints include generalized fatigue and has noticed generalized edema including her upper and lower extremities.  These symptoms have been present for "months and months".  When asked what motivated her to come to the emergency room today she states family members thought she needed to be checked out.  She has not seen her primary provider for these complaints.  She does have an appointment with her neurologist Dr. Terrace Arabia at the end of this month.  The history is provided by the patient.      Past Medical History:  Diagnosis Date  . Anxiety   . Back pain   . Depression   . Kidney stones   . Seizures [facility])     Patient Active Problem List   Diagnosis Date Noted  . Weight gain 01/10/2021  . Menstrual period late 01/10/2021  . Pregnancy examination or test, negative result 01/10/2021  . Seizures (HCC) 05/20/2020  . Chronic migraine without aura without status migrainosus, not intractable 05/20/2020  . Polypharmacy 05/20/2020  . Body mass index 27.0-27.9, adult 04/03/2020  . Weight loss counseling, encounter for 04/03/2020  . Abnormal uterine bleeding (AUB) 03/18/2020  . Lactation symptom 03/18/2020  . History of bilateral tubal ligation 11/15/2018  . Insomnia 03/15/2018  . Epilepsy (HCC) 11/23/2017  . Smoker 08/03/2017  . Depression 05/07/2017    Past Surgical History:  Procedure Laterality Date  . IMPLANTATION VAGAL NERVE STIMULATOR    .  TUBAL LIGATION N/A 10/10/2018   Procedure: POST PARTUM TUBAL LIGATION;  Surgeon: Hermina Staggers, MD;  Location: [facility] BIRTHING SUIT[facility];  Service: Gynecology;  Laterality: N/A;  . VSD REPAIR       OB History     Gravida  8   Para  4   Term  4   Preterm      AB  4   Living  4      SAB  2   IAB  2   Ectopic      Multiple  0   Live Births  4           Family History  Problem Relation Age of Onset  . Bipolar disorder Mother   . Alcohol abuse Mother   . Depression Sister   . Anxiety disorder Sister   . Alcohol abuse Maternal Grandfather   . Diabetes Maternal Grandfather   . Heart attack Maternal Grandfather   . Alcohol abuse Paternal Grandfather   . Heart disease Paternal Grandfather   . Other Father        unsure of history  . Brain cancer Father        Glioblastoma  . Asthma Son   . Other Son        EOE  . Colon cancer Maternal Grandmother   . Pneumonia Maternal Grandmother   . Other Maternal Grandmother        covid    Social History   Tobacco Use  . Smoking  status: Every Day    Packs/day: 0.50    Types: Cigarettes  . Smokeless tobacco: Never  Vaping Use  . Vaping Use: Never used  Substance Use Topics  . Alcohol use: Not Currently    Comment: occ., 05-07-2017 per pt 2-3 times a mth  . Drug use: No    Home Medications Prior to Admission medications   Medication Sig Start Date End Date Taking? Authorizing Provider  cariprazine (VRAYLAR) 1.5 MG capsule Take 1 capsule (1.5 mg total) by mouth daily. 02/05/21  Yes Myrlene Broker, MD  clonazePAM (KLONOPIN) 0.5 MG tablet Take 1 tablet (0.5 mg total) by mouth 2 (two) times daily as needed. for anxiety 02/05/21  Yes Myrlene Broker, MD  cyclobenzaprine (FLEXERIL) 10 MG tablet Take 1 tablet (10 mg total) by mouth 3 (three) times daily as needed for muscle spasms. 09/22/20  Yes Devoria Albe, MD  escitalopram (LEXAPRO) 20 MG tablet Take 1 tablet (20 mg total) by mouth 2 (two) times daily. 02/05/21 02/05/22 Yes Myrlene Broker, MD  HYDROcodone-acetaminophen (NORCO/VICODIN) 5-325 MG tablet Take 1 tablet by mouth 4 (four) times daily as needed. 03/20/21  Yes [provider]  ibuprofen (ADVIL) 800 MG tablet Take 800 mg by mouth every 6 (six) hours as needed for moderate pain.   Yes [provider]  meloxicam (MOBIC) 7.5 MG tablet TAKE 1 TABLET BY MOUTH ONCE DAILY AS NEEDED FOR PAIN Patient taking differently: Take 7.5 mg by mouth daily. 04/02/21  Yes Levert Feinstein, MD  ondansetron (ZOFRAN ODT) 4 MG disintegrating tablet Take 1 tablet (4 mg total) by mouth every 8 (eight) hours as needed. 08/20/20  Yes Levert Feinstein, MD  OVER THE COUNTER MEDICATION cbd   Yes [provider]  SUMAtriptan (IMITREX) 100 MG tablet Take 1 tablet (100 mg total) by mouth once as needed for up to 1 dose for migraine. May repeat in 2 hours if headache persists or recurs. 08/20/20  Yes Levert Feinstein, MD  topiramate (TOPAMAX) 100 MG tablet Take 3 tablets (300 mg total) by mouth 2 (two) times daily. 05/20/20  Yes Levert Feinstein, MD  zolpidem (AMBIEN CR) 12.5 MG CR tablet Take 1 tablet (12.5 mg total) by mouth at bedtime as needed for sleep. 02/05/21 02/05/22 Yes Myrlene Broker, MD  acetaminophen (TYLENOL) 500 MG tablet 1 tablet as needed Patient not taking: No sig reported    [provider]  naproxen (NAPROSYN) 500 MG tablet Take 1 po BID with food prn pain Patient not taking: No sig reported 09/22/20   Devoria Albe, MD  oxyCODONE-acetaminophen (PERCOCET) 5-325 MG tablet Take 1 tablet by mouth every 6 (six) hours as needed. Patient not taking: No sig reported 01/25/21   Bethann Berkshire, MD  predniSONE (DELTASONE) 10 MG tablet Take 2 tablets (20 mg total) by mouth daily. Patient not taking: No sig reported 01/25/21   Bethann Berkshire, MD  varenicline (CHANTIX) 0.5 MG tablet 1 tablet after eating with a full glass of water Patient not taking: No sig reported 12/31/20   [provider]    Allergies    Adhesive [tape],  Carbatrol [carbamazepine], Dilantin [phenytoin sodium extended], Phenobarbital, Tizanidine hcl, and Venlafaxine  Review of Systems   Review of Systems  Constitutional:  Positive for fatigue. Negative for fever.  HENT:  Negative for congestion.   Eyes: Negative.   Respiratory:  Positive for shortness of breath. Negative for chest tightness.   Cardiovascular:  Positive for chest pain.  Gastrointestinal:  Negative  for abdominal pain, nausea and vomiting.  Genitourinary: Negative.   Musculoskeletal:  Positive for back pain.       Chronic back pain  Skin: Negative.  Negative for rash and wound.  Neurological:  Negative for dizziness, weakness, light-headedness, numbness and headaches.  Psychiatric/Behavioral: Negative.    All other systems reviewed and are negative.  Physical Exam Updated Vital Signs BP 107/78   Pulse (!) 57   Temp 98.2 F (36.8 C) (Oral)   Resp 13   Ht 5\' 4"  (1.626 m)   Wt 81.6 kg   SpO2 100%   BMI 30.90 kg/m   Physical Exam Vitals and nursing note reviewed.  Constitutional:      Appearance: She is well-developed.  HENT:     Head: Normocephalic and atraumatic.  Eyes:     Conjunctiva/sclera: Conjunctivae normal.  Cardiovascular:     Rate and Rhythm: Normal rate and regular rhythm.     Heart sounds: Normal heart sounds.  Pulmonary:     Effort: Pulmonary effort is normal.     Breath sounds: Normal breath sounds. No wheezing, rhonchi or rales.  Abdominal:     General: Bowel sounds are normal.     Palpations: Abdomen is soft.     Tenderness: There is no abdominal tenderness.  Musculoskeletal:        General: Normal range of motion.     Cervical back: Normal range of motion.     Right lower leg: No tenderness. No edema.     Left lower leg: No tenderness. No edema.  Skin:    General: Skin is warm and dry.  Neurological:     Mental Status: She is alert.    ED Results / Procedures / Treatments   Labs (all labs ordered are listed, but only abnormal  results are displayed) Labs Reviewed  BASIC METABOLIC PANEL - Abnormal; Notable for the following components:      Result Value   CO2 21 (*)    BUN 22 (*)    Creatinine, Ser 1.05 (*)    All other components within normal limits  CBC  POC URINE PREG, ED  TROPONIN I (HIGH SENSITIVITY)    EKG EKG Interpretation  Date/Time:  Tuesday April 15 2021 17:55:49 EDT Ventricular Rate:  110 PR Interval:  122 QRS Duration: 76 QT Interval:  322 QTC Calculation: 435 R Axis:   97 Text Interpretation: Sinus tachycardia Rightward axis Borderline ECG Confirmed by [id number] [phone]) on 04/15/2021 9:53:41 PM  Radiology DG Chest 2 View  Result Date: 04/15/2021 CLINICAL DATA:  Chest pain EXAM: CHEST - 2 VIEW COMPARISON:  05/14/2017 FINDINGS: Heart and mediastinal contours are within normal limits. No focal opacities or effusions. No acute bony abnormality. IMPRESSION: No active cardiopulmonary disease. Electronically Signed   By: 07/14/2017 M.D.   On: 04/15/2021 18:54    Procedures Procedures   Medications Ordered in ED Medications - No data to display  ED Course  I have reviewed the triage vital signs and the nursing notes.  Pertinent labs & imaging results that were available during my care of the patient were reviewed by me and considered in my medical decision making (see chart for details).    MDM Rules/Calculators/A&P                           Patient with chronic constant chest pressure and fatigue which she states has been present for at least several months.  Troponin, EKG, chest x-ray are normal tonight, her exam is reassuring.  Given the chronicity of her symptoms it is unlikely that her symptoms are cardiac or pulmonary nature.  Doubt PE.  Patient's vital signs have been stable here.  She also endorses frequent triggering of her vagus nerve stimulator.  She is scheduled to see Dr. Terrace ArabiaYan later this month.  She was encouraged to call her office tomorrow to let her know about her  stimulator, she may want it evaluated sooner than waiting till the end of the month.  The patient appears reasonably screened and/or stabilized for discharge and I doubt any other medical condition or other [facility] requiring further screening, evaluation, or treatment in the ED at this time prior to discharge.  Final Clinical Impression(s) / ED Diagnoses Final diagnoses:  Nonspecific chest pain  Fatigue, unspecified type    Rx / DC Orders ED Discharge Orders     None        Victoriano Laindol, Ezell Melikian, PA-C 04/15/21 2158    Benjiman CorePickering, Nathan, MD 04/16/21 0025

## 2021-04-16 DIAGNOSIS — M9902 Segmental and somatic dysfunction of thoracic region: Secondary | ICD-10-CM | POA: Diagnosis not present

## 2021-04-16 DIAGNOSIS — M546 Pain in thoracic spine: Secondary | ICD-10-CM | POA: Diagnosis not present

## 2021-04-16 DIAGNOSIS — M542 Cervicalgia: Secondary | ICD-10-CM | POA: Diagnosis not present

## 2021-04-16 DIAGNOSIS — M9903 Segmental and somatic dysfunction of lumbar region: Secondary | ICD-10-CM | POA: Diagnosis not present

## 2021-04-16 DIAGNOSIS — M9901 Segmental and somatic dysfunction of cervical region: Secondary | ICD-10-CM | POA: Diagnosis not present

## 2021-04-17 ENCOUNTER — Other Ambulatory Visit: Payer: Self-pay

## 2021-04-17 ENCOUNTER — Encounter (HOSPITAL_COMMUNITY): Payer: Self-pay | Admitting: Physical Therapy

## 2021-04-17 ENCOUNTER — Ambulatory Visit (HOSPITAL_COMMUNITY): Payer: Medicare Other | Admitting: Physical Therapy

## 2021-04-17 DIAGNOSIS — R2689 Other abnormalities of gait and mobility: Secondary | ICD-10-CM | POA: Diagnosis not present

## 2021-04-17 DIAGNOSIS — R29898 Other symptoms and signs involving the musculoskeletal system: Secondary | ICD-10-CM

## 2021-04-17 DIAGNOSIS — M542 Cervicalgia: Secondary | ICD-10-CM | POA: Diagnosis not present

## 2021-04-17 DIAGNOSIS — M6281 Muscle weakness (generalized): Secondary | ICD-10-CM | POA: Diagnosis not present

## 2021-04-17 DIAGNOSIS — M545 Low back pain, unspecified: Secondary | ICD-10-CM | POA: Diagnosis not present

## 2021-04-17 NOTE — Patient Instructions (Signed)
Access Code: [email or web address] URL: [email or web address] Date: 04/17/2021 Prepared by: Georges Lynch  Exercises Bent Knee Fallouts with Alternating Legs - 2 x daily - 7 x weekly - 1-2 sets - 10 reps Hooklying Isometric Clamshell - 2 x daily - 7 x weekly - 1-2 sets - 10 reps - 5 second hold Supine Hip Adduction Isometric with Ball - 2 x daily - 7 x weekly - 1-2 sets - 10 reps - 5 second hold Supine Diaphragmatic Breathing - 2 x daily - 7 x weekly - 1 sets - 10 reps

## 2021-04-17 NOTE — Therapy (Signed)
[facility] [address] [place], [place], [address] Phone: [phone]   Fax:  [phone]  Physical Therapy Treatment  Patient Details  Name: Loretta Padilla MRN: [id number] Date of Birth: 08/29/1986 Referring Provider (PT): Sheran Luz MD   Encounter Date: 04/17/2021   PT End of Session - 04/17/21 1357     Visit Number 4    Number of Visits 12    Date for PT Re-Evaluation 05/20/21    Authorization Type Primary UHC medicare Secondary medicaid    Progress Note Due on Visit 10    PT Start Time 1351    PT Stop Time 1431    PT Time Calculation (min) 40 min    Activity Tolerance Patient tolerated treatment well    Behavior During Therapy [facility] for tasks assessed/performed             Past Medical History:  Diagnosis Date   Anxiety    Back pain    Depression    Kidney stones    Seizures (HCC)     Past Surgical History:  Procedure Laterality Date   IMPLANTATION VAGAL NERVE STIMULATOR     TUBAL LIGATION N/A 10/10/2018   Procedure: POST PARTUM TUBAL LIGATION;  Surgeon: Hermina Staggers, MD;  [facility];  Service: Gynecology;  Laterality: N/A;   VSD REPAIR      There were no vitals filed for this visit.   Subjective Assessment - 04/17/21 1355     Subjective Reports going to ED 2 days ago, was havoing some issues with her heart. Says all testing came back normal. Not sure what caused this. Went to [employer] yesterday, is a little stiff today.    Currently in Pain? Yes    Pain Score 4     Pain Location Back    Pain Orientation Posterior    Pain Descriptors / Indicators Nagging;Tightness    Pain Type Chronic pain                               [facility] Adult PT Treatment/Exercise - 04/17/21 0001       Lumbar Exercises: Supine   Ab Set 10 reps;5 seconds    Bent Knee Raise 10 reps;5 seconds    Bent Knee Raise Limitations with ab set    Bridge 10 reps    Other Supine Lumbar Exercises  diaphragmatic breathing x 10 deep breaths, iso hip abduction/ adduction 10 x 5" each    Other Supine Lumbar Exercises BKFO x20                      PT Short Term Goals - 04/07/21 1502       PT SHORT TERM GOAL #1   Title Patient will be independent with HEP in order to improve functional outcomes.    Time 3    Period Weeks    Status On-going    Target Date 04/29/21      PT SHORT TERM GOAL #2   Title Patient will report at least 25% improvement in symptoms for improved quality of life.    Time 3    Period Weeks    Status On-going    Target Date 04/29/21               PT Long Term Goals - 04/07/21 1506       PT LONG TERM GOAL #1  Title Patient will report at least 75% improvement in symptoms for improved quality of life.    Time 6    Period Weeks    Status On-going      PT LONG TERM GOAL #2   Title Patient will improve FOTO score by at least 10 points in order to indicate improved tolerance to activity.    Time 6    Period Weeks    Status On-going      PT LONG TERM GOAL #3   Title Patient will demonstrate at least 25% improvement in lumbar and cervical  ROM in all restricted planes for improved ability to move head while at at home    Time 6    Period Weeks    Status On-going                   Plan - 04/17/21 1428     Clinical Impression Statement Patient tolerated session well. Graded activity to patient tolerance. Educated patient on performing activity in pain free ROM and grading effort to minimize increased low back pain. Discussed importance of deep core muscle activation in relation to decreased back pain with ADLs. Issued updated HEP handout.    Personal Factors and Comorbidities Comorbidity 3+    Comorbidities Anxiety, Arthritis, Back pain, BMI over 30,  Depression, Headaches, Previous accidents, Prior Surgery, Seizures, Sleep dysfunction, VNS    Examination-Activity Limitations Locomotion  Level;Transfers;Stand;Squat;Sit;Lift;Carry;Caring for Others;Bend    Examination-Participation Restrictions Meal Prep;Cleaning;Occupation;Community Activity;Shop;Volunteer;Pincus Badder [facility]    Stability/Clinical Decision Making Stable/Uncomplicated    Rehab Potential Fair    PT Frequency 2x / week    PT Duration 6 weeks   beginning in 2 weeks after vacation   PT Treatment/Interventions ADLs/Self Care Home Management;Aquatic Therapy;Cryotherapy;Electrical Stimulation;Iontophoresis 4mg /ml Dexamethasone;Moist Heat;Traction;Ultrasound;DME Instruction;Gait training;Functional mobility training;Therapeutic activities;Stair training;Therapeutic exercise;Balance training;Neuromuscular re-education;Patient/family education;Orthotic Fit/Training;Manual techniques;Manual lymph drainage;Compression bandaging;Scar mobilization;Passive range of motion;Dry needling;Energy conservation;Splinting;Taping;Vasopneumatic Device;Spinal Manipulations;Joint Manipulations    PT Next Visit Plan Continue to progress hip, core and postural strength exrercise as tolerated.    PT Home Exercise Plan 7/19 bridge, SLR, Ab set/PPT 8/11 knee fallout, diaphragm breathing, hip abduction/ adduction iso    Consulted and Agree with Plan of Care Patient             Patient will benefit from skilled therapeutic intervention in order to improve the following deficits and impairments:  Decreased range of motion, Difficulty walking, Decreased endurance, Increased muscle spasms, Decreased activity tolerance, Pain, Decreased balance, Impaired flexibility, Improper body mechanics, Decreased mobility, Decreased strength, Postural dysfunction  Visit Diagnosis: Low back pain, unspecified back pain laterality, unspecified chronicity, unspecified whether sciatica present  Cervicalgia  Muscle weakness (generalized)  Other abnormalities of gait and mobility  Other symptoms and signs involving the musculoskeletal system     Problem  List Patient Active Problem List   Diagnosis Date Noted   Weight gain 01/10/2021   Menstrual period late 01/10/2021   Pregnancy examination or test, negative result 01/10/2021   Seizures (HCC) 05/20/2020   Chronic migraine without aura without status migrainosus, not intractable 05/20/2020   Polypharmacy 05/20/2020   Body mass index 27.0-27.9, adult 04/03/2020   Weight loss counseling, encounter for 04/03/2020   Abnormal uterine bleeding (AUB) 03/18/2020   Lactation symptom 03/18/2020   History of bilateral tubal ligation 11/15/2018   Insomnia 03/15/2018   Epilepsy (HCC) 11/23/2017   Smoker 08/03/2017   Depression 05/07/2017   2:34 PM, 04/17/21 06/17/21 PT DPT  Physical Therapist with [facility]  [facility]  [phone]   [facility] [address] [place], [place], [address] Phone: [phone]   Fax:  [phone]  Name: Loretta Padilla MRN: [id number] Date of Birth: 1986-03-06

## 2021-04-21 ENCOUNTER — Ambulatory Visit (HOSPITAL_COMMUNITY): Payer: Medicare Other | Admitting: Physical Therapy

## 2021-04-23 ENCOUNTER — Encounter (HOSPITAL_COMMUNITY): Payer: Medicare Other | Admitting: Physical Therapy

## 2021-04-24 ENCOUNTER — Ambulatory Visit: Payer: Self-pay | Admitting: Neurology

## 2021-04-24 ENCOUNTER — Encounter: Payer: Self-pay | Admitting: Neurology

## 2021-04-25 DIAGNOSIS — M9903 Segmental and somatic dysfunction of lumbar region: Secondary | ICD-10-CM | POA: Diagnosis not present

## 2021-04-25 DIAGNOSIS — M546 Pain in thoracic spine: Secondary | ICD-10-CM | POA: Diagnosis not present

## 2021-04-25 DIAGNOSIS — M542 Cervicalgia: Secondary | ICD-10-CM | POA: Diagnosis not present

## 2021-04-25 DIAGNOSIS — M9902 Segmental and somatic dysfunction of thoracic region: Secondary | ICD-10-CM | POA: Diagnosis not present

## 2021-04-25 DIAGNOSIS — M9901 Segmental and somatic dysfunction of cervical region: Secondary | ICD-10-CM | POA: Diagnosis not present

## 2021-04-28 ENCOUNTER — Ambulatory Visit (HOSPITAL_COMMUNITY): Payer: Medicare Other

## 2021-04-28 ENCOUNTER — Other Ambulatory Visit: Payer: Self-pay

## 2021-04-28 ENCOUNTER — Encounter (HOSPITAL_COMMUNITY): Payer: Self-pay

## 2021-04-28 DIAGNOSIS — M542 Cervicalgia: Secondary | ICD-10-CM | POA: Diagnosis not present

## 2021-04-28 DIAGNOSIS — R2689 Other abnormalities of gait and mobility: Secondary | ICD-10-CM

## 2021-04-28 DIAGNOSIS — R29898 Other symptoms and signs involving the musculoskeletal system: Secondary | ICD-10-CM | POA: Diagnosis not present

## 2021-04-28 DIAGNOSIS — M6281 Muscle weakness (generalized): Secondary | ICD-10-CM

## 2021-04-28 DIAGNOSIS — M545 Low back pain, unspecified: Secondary | ICD-10-CM

## 2021-04-28 NOTE — Therapy (Signed)
[facility] [address] [place], [place], [address] Phone: [phone]   Fax:  [phone]  Physical Therapy Treatment  Patient Details  Name: Loretta Padilla MRN: [id number] Date of Birth: [birth year] Referring Provider (PT): Sheran Luz MD   Encounter Date: 04/28/2021   PT End of Session - 04/28/21 1404     Visit Number 5    Number of Visits 12    Date for PT Re-Evaluation 05/20/21    Authorization Type Primary UHC medicare Secondary medicaid    Progress Note Due on Visit 10    PT Start Time 1307    PT Stop Time 1346    PT Time Calculation (min) 39 min    Activity Tolerance Patient tolerated treatment well    Behavior During Therapy [facility] for tasks assessed/performed             Past Medical History:  Diagnosis Date   Anxiety    Back pain    Depression    Kidney stones    Seizures (HCC)     Past Surgical History:  Procedure Laterality Date   IMPLANTATION VAGAL NERVE STIMULATOR     TUBAL LIGATION N/A 10/10/2018   Procedure: POST PARTUM TUBAL LIGATION;  Surgeon: Hermina Staggers, MD;  [facility];  Service: Gynecology;  Laterality: N/A;   VSD REPAIR      There were no vitals filed for this visit.   Subjective Assessment - 04/28/21 1401     Subjective Missed last appointment due to son being in hospital. Has done some of her HEP. Reports she "fracture" her pelvic with childbirth so getting those muscles to work is hard. Has a vagal nerve stimulator due to seizures.    Currently in Pain? Yes    Pain Score 2               [facility] Adult PT Treatment/Exercise - 04/28/21 0001       Bed Mobility   Bed Mobility Rolling Right;Right Sidelying to Sit;Sit to Sidelying Right    Rolling Right Independent    Right Sidelying to Sit Independent    Sit to Sidelying Right Independent      Self-Care   Self-Care Posture;Other Self-Care Comments;Lifting    Posture gravity, compression of nerves, transversus  abdominus    Other Self-Care Comments  seated in car      Lumbar Exercises: Seated   Other Seated Lumbar Exercises ab set 5 sec hold x10      Lumbar Exercises: Supine   Ab Set 10 reps;5 seconds                    PT Education - 04/28/21 1402     Education Details Discussed purpose and technique on interventions throughout session. Importance of postures and ab set activity outside of therapy setting. Advanced HEP to include lifting and static posture personal assessment.    Person(s) Educated Patient    Methods Explanation;Handout    Comprehension Verbalized understanding;Need further instruction              PT Short Term Goals - 04/28/21 1408       PT SHORT TERM GOAL #1   Title Patient will be independent with HEP in order to improve functional outcomes.    Time 3    Period Weeks    Status On-going    Target Date 04/29/21      PT SHORT TERM GOAL #2   Title Patient  will report at least 25% improvement in symptoms for improved quality of life.    Time 3    Period Weeks    Status On-going    Target Date 04/29/21               PT Long Term Goals - 04/28/21 1409       PT LONG TERM GOAL #1   Title Patient will report at least 75% improvement in symptoms for improved quality of life.    Time 6    Period Weeks    Status On-going      PT LONG TERM GOAL #2   Title Patient will improve FOTO score by at least 10 points in order to indicate improved tolerance to activity.    Time 6    Period Weeks    Status On-going      PT LONG TERM GOAL #3   Title Patient will demonstrate at least 25% improvement in lumbar and cervical  ROM in all restricted planes for improved ability to move head while at at home    Time 6    Period Weeks    Status On-going                   Plan - 04/28/21 1404     Clinical Impression Statement Patient tolerated session well. Significant time of session dedicated to patient education on anatomy, log rolling,  lifting techniques, static postures, importance of proper posture and positioning to decrease stresses on spine and related structures as well as to decrease pain. Reviewed ab set in sitting and supine pairing transversus abdominus with Kegel exercises for improved carry over and technique. Issued updated HEP handout with lifting techniques and static postures. Patient would continue to benefit from skilled physical therapy to reduce impairment and increase functional abilities.    Personal Factors and Comorbidities Comorbidity 3+    Comorbidities Anxiety, Arthritis, Back pain, BMI over 30,  Depression, Headaches, Previous accidents, Prior Surgery, Seizures, Sleep dysfunction, VNS    Examination-Activity Limitations Locomotion Level;Transfers;Stand;Squat;Sit;Lift;Carry;Caring for Others;Bend    Examination-Participation Restrictions Meal Prep;Cleaning;Occupation;Community Activity;Shop;Volunteer;Pincus Badder [facility]    Stability/Clinical Decision Making Stable/Uncomplicated    Rehab Potential Fair    PT Frequency 2x / week    PT Duration 6 weeks   beginning in 2 weeks after vacation   PT Treatment/Interventions ADLs/Self Care Home Management;Aquatic Therapy;Cryotherapy;Electrical Stimulation;Iontophoresis 4mg /ml Dexamethasone;Moist Heat;Traction;Ultrasound;DME Instruction;Gait training;Functional mobility training;Therapeutic activities;Stair training;Therapeutic exercise;Balance training;Neuromuscular re-education;Patient/family education;Orthotic Fit/Training;Manual techniques;Manual lymph drainage;Compression bandaging;Scar mobilization;Passive range of motion;Dry needling;Energy conservation;Splinting;Taping;Vasopneumatic Device;Spinal Manipulations;Joint Manipulations    PT Next Visit Plan Continue to progress hip, core and postural strength exrercise as tolerated.    PT Home Exercise Plan 7/19 bridge, SLR, Ab set/PPT 8/11 knee fallout, diaphragm breathing, hip abduction/ adduction iso; lifting  techniques, static postures    Consulted and Agree with Plan of Care Patient             Patient will benefit from skilled therapeutic intervention in order to improve the following deficits and impairments:  Decreased range of motion, Difficulty walking, Decreased endurance, Increased muscle spasms, Decreased activity tolerance, Pain, Decreased balance, Impaired flexibility, Improper body mechanics, Decreased mobility, Decreased strength, Postural dysfunction  Visit Diagnosis: Low back pain, unspecified back pain laterality, unspecified chronicity, unspecified whether sciatica present  Cervicalgia  Muscle weakness (generalized)  Other abnormalities of gait and mobility  Other symptoms and signs involving the musculoskeletal system     Problem List Patient Active Problem List   Diagnosis Date  Noted   Weight gain 01/10/2021   Menstrual period late 01/10/2021   Pregnancy examination or test, negative result 01/10/2021   Seizures (HCC) 05/20/2020   Chronic migraine without aura without status migrainosus, not intractable 05/20/2020   Polypharmacy 05/20/2020   Body mass index 27.0-27.9, adult 04/03/2020   Weight loss counseling, encounter for 04/03/2020   Abnormal uterine bleeding (AUB) 03/18/2020   Lactation symptom 03/18/2020   History of bilateral tubal ligation 11/15/2018   Insomnia 03/15/2018   Epilepsy (HCC) 11/23/2017   Smoker 08/03/2017   Depression 05/07/2017   Britta Mccreedy D. Hartnett-Rands, MS, PT [place] PT [facility] [place] [phone] Epifanio Lesches 04/28/2021, 2:10 PM  [facility] [address] [place], [place], [address] Phone: [phone]   Fax:  [phone]  Name: Loretta Padilla MRN: [id number] Date of Birth: 12-12-1985

## 2021-04-28 NOTE — Patient Instructions (Signed)
Lifting Principles  Maintain proper posture and head alignment. Slide object as close as possible before lifting. Move obstacles out of the way. Test before lifting; ask for help if too heavy. Tighten stomach muscles without holding breath. Use smooth movements; do not jerk. Use legs to do the work, and pivot with feet. Distribute the work load symmetrically and close to the center of trunk. Push instead of pull whenever possible.  Copyright  VHI. All rights reserved.   Cart    When reaching into cart with one arm, lift opposite leg to keep back straight.   Copyright  VHI. All rights reserved.   Ask For Help    Ask for help and delegate to others when possible. Coordinate your movements when lifting together, and maintain the low back curve.   Copyright  VHI. All rights reserved.   Deep Squat    Squat and lift with both arms held against upper trunk. Tighten stomach muscles without holding breath. Use smooth movements to avoid jerking.  Copyright  VHI. All rights reserved.   WASHING DISHES AT KITCHEN SINK - USE CABINET SURFACE TO PUT ONE FOOT UP ONTO TO DECREASE STRESS ON BACK.

## 2021-05-01 ENCOUNTER — Encounter (HOSPITAL_COMMUNITY): Payer: Medicare Other | Admitting: Physical Therapy

## 2021-05-01 ENCOUNTER — Telehealth (HOSPITAL_COMMUNITY): Payer: Self-pay | Admitting: Physical Therapy

## 2021-05-01 NOTE — Telephone Encounter (Signed)
Pt did not show for appt (4th non-consecutive NS 8/3, 8/15, 8/18).  States she did not have this appt on her sheet and did not get a call reminder for this also.   Pt reminded of next 2 appt and pt wrote these down for aquatics on 8/20 at 2pm and land therapy on Thursday at 1:15.  Pt will need a new print out of her appointments.    Lurena Nida, PTA/CLT [phone]

## 2021-05-05 ENCOUNTER — Ambulatory Visit (HOSPITAL_COMMUNITY): Payer: Medicare Other | Admitting: Physical Therapy

## 2021-05-07 DIAGNOSIS — M5136 Other intervertebral disc degeneration, lumbar region: Secondary | ICD-10-CM | POA: Diagnosis not present

## 2021-05-07 DIAGNOSIS — M542 Cervicalgia: Secondary | ICD-10-CM | POA: Diagnosis not present

## 2021-05-07 DIAGNOSIS — M47812 Spondylosis without myelopathy or radiculopathy, cervical region: Secondary | ICD-10-CM | POA: Diagnosis not present

## 2021-05-07 DIAGNOSIS — M5412 Radiculopathy, cervical region: Secondary | ICD-10-CM | POA: Diagnosis not present

## 2021-05-07 DIAGNOSIS — M5459 Other low back pain: Secondary | ICD-10-CM | POA: Diagnosis not present

## 2021-05-07 DIAGNOSIS — M5030 Other cervical disc degeneration, unspecified cervical region: Secondary | ICD-10-CM | POA: Diagnosis not present

## 2021-05-08 ENCOUNTER — Ambulatory Visit (HOSPITAL_COMMUNITY): Payer: Medicare Other | Admitting: Physical Therapy

## 2021-05-09 DIAGNOSIS — M542 Cervicalgia: Secondary | ICD-10-CM | POA: Diagnosis not present

## 2021-05-09 DIAGNOSIS — M9901 Segmental and somatic dysfunction of cervical region: Secondary | ICD-10-CM | POA: Diagnosis not present

## 2021-05-09 DIAGNOSIS — M9902 Segmental and somatic dysfunction of thoracic region: Secondary | ICD-10-CM | POA: Diagnosis not present

## 2021-05-09 DIAGNOSIS — M546 Pain in thoracic spine: Secondary | ICD-10-CM | POA: Diagnosis not present

## 2021-05-09 DIAGNOSIS — M9903 Segmental and somatic dysfunction of lumbar region: Secondary | ICD-10-CM | POA: Diagnosis not present

## 2021-05-13 ENCOUNTER — Telehealth (HOSPITAL_COMMUNITY): Payer: Self-pay | Admitting: Physical Therapy

## 2021-05-13 ENCOUNTER — Ambulatory Visit (HOSPITAL_COMMUNITY): Payer: Medicare Other | Admitting: Physical Therapy

## 2021-05-13 NOTE — Telephone Encounter (Signed)
Called about today's missed appointment. Informed of no show policy per 2nd consecutive no show. Updated schedule. Gave reminder of upcoming visits 9/8.  1:26 PM, 05/13/21 Loretta Padilla PT DPT  Physical Therapist with [facility]  [phone]

## 2021-05-15 ENCOUNTER — Ambulatory Visit (HOSPITAL_COMMUNITY): Payer: Medicare Other | Attending: Physical Medicine and Rehabilitation | Admitting: Physical Therapy

## 2021-05-15 ENCOUNTER — Telehealth (HOSPITAL_COMMUNITY): Payer: Self-pay | Admitting: Physical Therapy

## 2021-05-15 NOTE — Telephone Encounter (Signed)
Patient no show, left message for patient making her aware of missed appointment and that she would be discharged from therapy due to no show policy and lack of attendance. Informed her she would need new referral to return to PT.  12:18 PM, 05/15/21 Wyman Songster PT, DPT Physical Therapist at [facility]

## 2021-05-19 ENCOUNTER — Ambulatory Visit (HOSPITAL_COMMUNITY): Payer: Medicare Other | Admitting: Physical Therapy

## 2021-05-20 NOTE — Progress Notes (Signed)
This encounter was created in error - please disregard.

## 2021-05-22 ENCOUNTER — Other Ambulatory Visit: Payer: Self-pay | Admitting: Neurology

## 2021-05-22 ENCOUNTER — Encounter (HOSPITAL_COMMUNITY): Payer: Medicare Other | Admitting: Physical Therapy

## 2021-05-23 DIAGNOSIS — M542 Cervicalgia: Secondary | ICD-10-CM | POA: Diagnosis not present

## 2021-05-23 DIAGNOSIS — M9902 Segmental and somatic dysfunction of thoracic region: Secondary | ICD-10-CM | POA: Diagnosis not present

## 2021-05-23 DIAGNOSIS — M9901 Segmental and somatic dysfunction of cervical region: Secondary | ICD-10-CM | POA: Diagnosis not present

## 2021-05-23 DIAGNOSIS — M9903 Segmental and somatic dysfunction of lumbar region: Secondary | ICD-10-CM | POA: Diagnosis not present

## 2021-05-23 DIAGNOSIS — M546 Pain in thoracic spine: Secondary | ICD-10-CM | POA: Diagnosis not present

## 2021-05-26 ENCOUNTER — Ambulatory Visit (HOSPITAL_COMMUNITY): Payer: Medicare Other | Admitting: Physical Therapy

## 2021-05-29 ENCOUNTER — Encounter (HOSPITAL_COMMUNITY): Payer: Medicare Other | Admitting: Physical Therapy

## 2021-06-20 ENCOUNTER — Other Ambulatory Visit (HOSPITAL_COMMUNITY): Payer: Self-pay | Admitting: Psychiatry

## 2021-06-20 ENCOUNTER — Other Ambulatory Visit: Payer: Self-pay | Admitting: Neurology

## 2021-06-21 NOTE — Telephone Encounter (Signed)
Call for appt

## 2021-07-09 DIAGNOSIS — S5011XA Contusion of right forearm, initial encounter: Secondary | ICD-10-CM | POA: Diagnosis not present

## 2021-07-09 DIAGNOSIS — F17210 Nicotine dependence, cigarettes, uncomplicated: Secondary | ICD-10-CM | POA: Diagnosis not present

## 2021-07-09 DIAGNOSIS — M79631 Pain in right forearm: Secondary | ICD-10-CM | POA: Diagnosis not present

## 2021-07-09 DIAGNOSIS — S5000XA Contusion of unspecified elbow, initial encounter: Secondary | ICD-10-CM | POA: Diagnosis not present

## 2021-07-17 DIAGNOSIS — F17210 Nicotine dependence, cigarettes, uncomplicated: Secondary | ICD-10-CM | POA: Diagnosis not present

## 2021-07-17 DIAGNOSIS — Z760 Encounter for issue of repeat prescription: Secondary | ICD-10-CM | POA: Diagnosis not present

## 2021-07-17 DIAGNOSIS — R569 Unspecified convulsions: Secondary | ICD-10-CM | POA: Diagnosis not present

## 2021-07-30 DIAGNOSIS — J029 Acute pharyngitis, unspecified: Secondary | ICD-10-CM | POA: Diagnosis not present

## 2021-07-30 DIAGNOSIS — B349 Viral infection, unspecified: Secondary | ICD-10-CM | POA: Diagnosis not present

## 2021-07-30 DIAGNOSIS — Z20822 Contact with and (suspected) exposure to covid-19: Secondary | ICD-10-CM | POA: Diagnosis not present

## 2021-07-30 DIAGNOSIS — R059 Cough, unspecified: Secondary | ICD-10-CM | POA: Diagnosis not present

## 2021-07-30 DIAGNOSIS — F17210 Nicotine dependence, cigarettes, uncomplicated: Secondary | ICD-10-CM | POA: Diagnosis not present

## 2021-08-15 ENCOUNTER — Other Ambulatory Visit: Payer: Self-pay | Admitting: Neurology

## 2021-08-15 DIAGNOSIS — R569 Unspecified convulsions: Secondary | ICD-10-CM

## 2021-08-15 DIAGNOSIS — Z79899 Other long term (current) drug therapy: Secondary | ICD-10-CM

## 2021-08-15 DIAGNOSIS — G43709 Chronic migraine without aura, not intractable, without status migrainosus: Secondary | ICD-10-CM

## 2021-08-25 ENCOUNTER — Encounter (HOSPITAL_COMMUNITY): Payer: Self-pay | Admitting: Physical Therapy

## 2021-08-25 NOTE — Therapy (Signed)
[place] °[facility] °[address] °[place], [place], [address] °Phone: [phone]   Fax:  [phone] ° °Patient Details  °Name: Loretta Padilla °MRN: [id number] °Date of Birth: 07/23/1986 °Referring Provider:  No ref. provider found ° °Encounter Date: 08/25/2021 ° °PHYSICAL THERAPY DISCHARGE SUMMARY ° °Visits from Start of Care: 5 ° °Current functional level related to goals / functional outcomes: °Unknown as patient has not returned °  °Remaining deficits: °Unknown °  °Education / Equipment: °HEP  ° °Patient agrees to discharge. Patient goals were not met. Patient is being discharged due to not returning since the last visit. ° ° °9:48 AM, 08/25/21 ° S.  PT, DPT °Physical Therapist at [place] °[facility] ° ° ° °[facility] °[address] °[place], [place], [address] °Phone: [phone]   Fax:  [phone] °

## 2021-08-28 DIAGNOSIS — G4700 Insomnia, unspecified: Secondary | ICD-10-CM | POA: Diagnosis not present

## 2021-08-28 DIAGNOSIS — G43009 Migraine without aura, not intractable, without status migrainosus: Secondary | ICD-10-CM | POA: Diagnosis not present

## 2021-10-07 ENCOUNTER — Telehealth (HOSPITAL_COMMUNITY): Payer: Self-pay | Admitting: Psychiatry

## 2021-10-07 NOTE — Telephone Encounter (Signed)
Called to schedule f/u appt left vm to return call and schedule
